# Patient Record
Sex: Female | Born: 1950 | Hispanic: Yes | Marital: Single | Smoking: Never smoker
Health system: Southern US, Community
[De-identification: ages and names within clinical notes are randomized; demographics above are authoritative.]

## PROBLEM LIST (undated history)

## (undated) DIAGNOSIS — E119 Type 2 diabetes mellitus without complications: Secondary | ICD-10-CM

## (undated) DIAGNOSIS — E785 Hyperlipidemia, unspecified: Secondary | ICD-10-CM

## (undated) DIAGNOSIS — I10 Essential (primary) hypertension: Secondary | ICD-10-CM

---

## 2017-03-12 DIAGNOSIS — J189 Pneumonia, unspecified organism: Secondary | ICD-10-CM

## 2017-03-12 DIAGNOSIS — A419 Sepsis, unspecified organism: Secondary | ICD-10-CM

## 2017-03-12 DIAGNOSIS — E119 Type 2 diabetes mellitus without complications: Secondary | ICD-10-CM

## 2017-03-12 DIAGNOSIS — N390 Urinary tract infection, site not specified: Secondary | ICD-10-CM

## 2017-03-12 DIAGNOSIS — R0902 Hypoxemia: Secondary | ICD-10-CM

## 2017-03-12 DIAGNOSIS — I10 Essential (primary) hypertension: Secondary | ICD-10-CM

## 2017-09-02 ENCOUNTER — Encounter (HOSPITAL_COMMUNITY): Payer: Self-pay | Admitting: Emergency Medicine

## 2017-09-02 ENCOUNTER — Inpatient Hospital Stay (HOSPITAL_COMMUNITY)
Admission: EM | Admit: 2017-09-02 | Discharge: 2017-09-06 | DRG: 065 | Disposition: A | Discharge status: Rehabilitation Facility | Payer: Medicaid Other | Attending: Neurology | Admitting: Neurology

## 2017-09-02 ENCOUNTER — Emergency Department (HOSPITAL_COMMUNITY): Payer: Medicaid Other

## 2017-09-02 DIAGNOSIS — R4701 Aphasia: Secondary | ICD-10-CM | POA: Diagnosis present

## 2017-09-02 DIAGNOSIS — E669 Obesity, unspecified: Secondary | ICD-10-CM | POA: Diagnosis present

## 2017-09-02 DIAGNOSIS — R471 Dysarthria and anarthria: Secondary | ICD-10-CM | POA: Diagnosis present

## 2017-09-02 DIAGNOSIS — E1165 Type 2 diabetes mellitus with hyperglycemia: Secondary | ICD-10-CM | POA: Diagnosis present

## 2017-09-02 DIAGNOSIS — E1151 Type 2 diabetes mellitus with diabetic peripheral angiopathy without gangrene: Secondary | ICD-10-CM | POA: Diagnosis present

## 2017-09-02 DIAGNOSIS — I69391 Dysphagia following cerebral infarction: Secondary | ICD-10-CM

## 2017-09-02 DIAGNOSIS — I639 Cerebral infarction, unspecified: Secondary | ICD-10-CM | POA: Diagnosis present

## 2017-09-02 DIAGNOSIS — R1310 Dysphagia, unspecified: Secondary | ICD-10-CM | POA: Diagnosis present

## 2017-09-02 DIAGNOSIS — I610 Nontraumatic intracerebral hemorrhage in hemisphere, subcortical: Principal | ICD-10-CM | POA: Diagnosis present

## 2017-09-02 DIAGNOSIS — R29715 NIHSS score 15: Secondary | ICD-10-CM | POA: Diagnosis present

## 2017-09-02 DIAGNOSIS — R402244 Coma scale, best verbal response, confused conversation, 24 hours or more after hospital admission: Secondary | ICD-10-CM | POA: Diagnosis present

## 2017-09-02 DIAGNOSIS — R402144 Coma scale, eyes open, spontaneous, 24 hours or more after hospital admission: Secondary | ICD-10-CM | POA: Diagnosis present

## 2017-09-02 DIAGNOSIS — E785 Hyperlipidemia, unspecified: Secondary | ICD-10-CM | POA: Diagnosis present

## 2017-09-02 DIAGNOSIS — R29810 Facial weakness: Secondary | ICD-10-CM | POA: Diagnosis present

## 2017-09-02 DIAGNOSIS — Z6826 Body mass index (BMI) 26.0-26.9, adult: Secondary | ICD-10-CM | POA: Diagnosis not present

## 2017-09-02 DIAGNOSIS — Z7984 Long term (current) use of oral hypoglycemic drugs: Secondary | ICD-10-CM | POA: Diagnosis not present

## 2017-09-02 DIAGNOSIS — G8101 Flaccid hemiplegia affecting right dominant side: Secondary | ICD-10-CM | POA: Diagnosis present

## 2017-09-02 DIAGNOSIS — I10 Essential (primary) hypertension: Secondary | ICD-10-CM | POA: Diagnosis present

## 2017-09-02 DIAGNOSIS — R402364 Coma scale, best motor response, obeys commands, 24 hours or more after hospital admission: Secondary | ICD-10-CM | POA: Diagnosis present

## 2017-09-02 DIAGNOSIS — I6789 Other cerebrovascular disease: Secondary | ICD-10-CM | POA: Diagnosis present

## 2017-09-02 DIAGNOSIS — R531 Weakness: Secondary | ICD-10-CM | POA: Diagnosis not present

## 2017-09-02 DIAGNOSIS — D72829 Elevated white blood cell count, unspecified: Secondary | ICD-10-CM

## 2017-09-02 DIAGNOSIS — E1169 Type 2 diabetes mellitus with other specified complication: Secondary | ICD-10-CM

## 2017-09-02 HISTORY — DX: Essential (primary) hypertension: I10

## 2017-09-02 HISTORY — DX: Hyperlipidemia, unspecified: E78.5

## 2017-09-02 HISTORY — DX: Type 2 diabetes mellitus without complications: E11.9

## 2017-09-02 LAB — I-STAT CHEM 8, ED
BUN: 20 mg/dL (ref 6–20)
Calcium, Ion: 1.12 mmol/L — ABNORMAL LOW (ref 1.15–1.40)
Chloride: 104 mmol/L (ref 101–111)
Creatinine, Ser: 0.8 mg/dL (ref 0.44–1.00)
Glucose, Bld: 212 mg/dL — ABNORMAL HIGH (ref 65–99)
HCT: 39 % (ref 36.0–46.0)
Hemoglobin: 13.3 g/dL (ref 12.0–15.0)
Potassium: 4 mmol/L (ref 3.5–5.1)
Sodium: 140 mmol/L (ref 135–145)
TCO2: 23 mmol/L (ref 22–32)

## 2017-09-02 LAB — MRSA PCR SCREENING: MRSA by PCR: NEGATIVE

## 2017-09-02 LAB — COMPREHENSIVE METABOLIC PANEL
ALT: 20 U/L (ref 14–54)
AST: 25 U/L (ref 15–41)
Albumin: 3.4 g/dL — ABNORMAL LOW (ref 3.5–5.0)
Alkaline Phosphatase: 78 U/L (ref 38–126)
Anion gap: 11 (ref 5–15)
BUN: 19 mg/dL (ref 6–20)
CO2: 23 mmol/L (ref 22–32)
Calcium: 9 mg/dL (ref 8.9–10.3)
Chloride: 104 mmol/L (ref 101–111)
Creatinine, Ser: 0.92 mg/dL (ref 0.44–1.00)
GFR calc Af Amer: >60 mL/min (ref >60)
GFR calc non Af Amer: >60 mL/min (ref >60)
Glucose, Bld: 203 mg/dL — ABNORMAL HIGH (ref 65–99)
Potassium: 4 mmol/L (ref 3.5–5.1)
Sodium: 138 mmol/L (ref 135–145)
Total Bilirubin: <0.1 mg/dL — ABNORMAL LOW (ref 0.3–1.2)
Total Protein: 7 g/dL (ref 6.5–8.1)

## 2017-09-02 LAB — CBC
HCT: 38.4 % (ref 36.0–46.0)
Hemoglobin: 12.7 g/dL (ref 12.0–15.0)
MCH: 28.7 pg (ref 26.0–34.0)
MCHC: 33.1 g/dL (ref 30.0–36.0)
MCV: 86.9 fL (ref 78.0–100.0)
Platelets: 180 10*3/uL (ref 150–400)
RBC: 4.42 MIL/uL (ref 3.87–5.11)
RDW: 13.1 % (ref 11.5–15.5)
WBC: 14.2 10*3/uL — ABNORMAL HIGH (ref 4.0–10.5)

## 2017-09-02 LAB — DIFFERENTIAL
Basophils Absolute: 0.1 10*3/uL (ref 0.0–0.1)
Basophils Relative: 0 %
Eosinophils Absolute: 0.3 10*3/uL (ref 0.0–0.7)
Eosinophils Relative: 2 %
Lymphocytes Relative: 22 %
Lymphs Abs: 3.1 10*3/uL (ref 0.7–4.0)
Monocytes Absolute: 0.8 10*3/uL (ref 0.1–1.0)
Monocytes Relative: 5 %
Neutro Abs: 10.1 10*3/uL — ABNORMAL HIGH (ref 1.7–7.7)
Neutrophils Relative %: 71 %

## 2017-09-02 LAB — I-STAT TROPONIN, ED: Troponin i, poc: 0 ng/mL (ref 0.00–0.08)

## 2017-09-02 LAB — APTT: aPTT: 24 seconds (ref 24–36)

## 2017-09-02 LAB — CBG MONITORING, ED: Glucose-Capillary: 191 mg/dL — ABNORMAL HIGH (ref 65–99)

## 2017-09-02 LAB — ETHANOL: Alcohol, Ethyl (B): <10 mg/dL (ref <10)

## 2017-09-02 LAB — PROTIME-INR
INR: 0.93
Prothrombin Time: 12.3 seconds (ref 11.4–15.2)

## 2017-09-02 LAB — GLUCOSE, CAPILLARY: Glucose-Capillary: 124 mg/dL — ABNORMAL HIGH (ref 65–99)

## 2017-09-02 MED ORDER — INSULIN ASPART 100 UNIT/ML ~~LOC~~ SOLN
2.0000 [IU] | SUBCUTANEOUS | Status: DC
  Administered 2017-09-03 (×2): 2 [IU] via SUBCUTANEOUS
  Administered 2017-09-03 – 2017-09-04 (×2): 4 [IU] via SUBCUTANEOUS
  Administered 2017-09-04: 2 [IU] via SUBCUTANEOUS
  Administered 2017-09-04: 4 [IU] via SUBCUTANEOUS
  Administered 2017-09-05: 6 [IU] via SUBCUTANEOUS
  Administered 2017-09-05 (×2): 4 [IU] via SUBCUTANEOUS
  Administered 2017-09-05 – 2017-09-06 (×2): 2 [IU] via SUBCUTANEOUS

## 2017-09-02 MED ORDER — STROKE: EARLY STAGES OF RECOVERY BOOK
Freq: Once | Status: AC
  Administered 2017-09-02: 22:00:00
  Filled 2017-09-02: qty 1

## 2017-09-02 MED ORDER — PANTOPRAZOLE SODIUM 40 MG IV SOLR
40.0000 mg | Freq: Every day | INTRAVENOUS | Status: DC
  Administered 2017-09-02 – 2017-09-03 (×2): 40 mg via INTRAVENOUS
  Filled 2017-09-02 (×2): qty 40

## 2017-09-02 MED ORDER — ACETAMINOPHEN 325 MG PO TABS
650.0000 mg | ORAL_TABLET | ORAL | Status: DC | PRN
Start: 2017-09-02 — End: 2017-09-06

## 2017-09-02 MED ORDER — CLEVIDIPINE BUTYRATE 0.5 MG/ML IV EMUL
0.0000 mg/h | INTRAVENOUS | Status: DC
  Administered 2017-09-02: 1 mg/h via INTRAVENOUS
  Administered 2017-09-02: 16 mg/h via INTRAVENOUS
  Administered 2017-09-03: 6 mg/h via INTRAVENOUS
  Administered 2017-09-03: 5 mg/h via INTRAVENOUS
  Administered 2017-09-03 – 2017-09-04 (×2): 6 mg/h via INTRAVENOUS
  Filled 2017-09-02 (×2): qty 50
  Filled 2017-09-02: qty 100
  Filled 2017-09-02 (×3): qty 50

## 2017-09-02 MED ORDER — ACETAMINOPHEN 160 MG/5ML PO SOLN
650.0000 mg | ORAL | Status: DC | PRN
Start: 2017-09-02 — End: 2017-09-06

## 2017-09-02 MED ORDER — ACETAMINOPHEN 650 MG RE SUPP
650.0000 mg | RECTAL | Status: DC | PRN
Start: 2017-09-02 — End: 2017-09-06

## 2017-09-02 MED ORDER — SENNOSIDES-DOCUSATE SODIUM 8.6-50 MG PO TABS
1.0000 | ORAL_TABLET | Freq: Two times a day (BID) | ORAL | Status: DC
  Administered 2017-09-03 – 2017-09-06 (×6): 1 via ORAL
  Filled 2017-09-02 (×7): qty 1

## 2017-09-02 NOTE — ED Notes (Signed)
CBG was 191

## 2017-09-02 NOTE — ED Notes (Signed)
Pt's family now at bedside speaking with Neurology

## 2017-09-02 NOTE — ED Triage Notes (Signed)
Pt LSN 1530. Family found pt unconscious on floor at 1530. Pt had been with family prior to this and was WNL. Pt nonverbal for family, right arm/leg weakness, right sided facial droop. CBG 241, BP 160/80, HR 100 SR. Pt spanish speaking.

## 2017-09-02 NOTE — H&P (Signed)
Neurology H&P  CC: Right-sided weakness  History is obtained from: Family, EMS  HPI: Cindy Thornton is a 66 y.o. female with a history of hypertension who presents with right-sided weakness that started earlier today.  She was last known well around 1 PM.  Her niece left to go to an event and then around 3:30 PM, she talked to her son on the phone, but he could tell something was not right.  He then drove and found her on the floor, not responding very much.  EMS was therefore called and transported her as a code stroke.  LKW: Noon tpa given?: no, ICH ICH Score: 0  ROS:  Unable to obtain due to altered mental status.   Past Medical History:  Diagnosis Date  . Diabetes mellitus without complication (HCC)   . Hyperlipidemia   . Hypertension      Family history: Unable to obtain due to altered mental status   Social History:  reports that  has never smoked. she has never used smokeless tobacco. She reports that she does not drink alcohol or use drugs.   Exam: Current vital signs: BP (!) 152/67   Pulse (!) 103   Temp 98.5 F (36.9 C) (Tympanic)   Resp 18   Wt 71.9 kg (158 lb 8.2 oz)   SpO2 98%  Vital signs in last 24 hours: Temp:  [98.5 F (36.9 C)] 98.5 F (36.9 C) (12/11 1825) Pulse Rate:  [94-103] 103 (12/11 1840) Resp:  [14-21] 18 (12/11 1840) BP: (145-183)/(55-140) 152/67 (12/11 1840) SpO2:  [96 %-98 %] 98 % (12/11 1840) Weight:  [71.9 kg (158 lb 8.2 oz)] 71.9 kg (158 lb 8.2 oz) (12/11 1824)  Physical Exam  Constitutional: Appears well-developed and well-nourished.  Psych: Affect appears flattened Eyes: No scleral injection HENT: No OP obstrucion Head: Normocephalic.  Cardiovascular: Normal rate and regular rhythm.  Respiratory: Effort normal and breath sounds normal to anterior ascultation GI: Soft.  No distension. There is no tenderness.  Skin: WDI  Neuro: Mental Status: Patient is awake, alert, she is able to tell me her name, but does not answer the  month or her age She is severely dysarthric. Cranial Nerves: II: Visual Fields are full. Pupils are equal, round, and reactive to light.   III,IV, VI: She crosses midline readily, but does appear to have a slight left gaze preference. 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  Due to the location, she is not a surgical candidate at this time.  She is being admitted to the intensive care unit for aggressive blood pressure management and close observation.  Recommendations: 1) Admit to ICU 2) no antiplatelets or anticoagulants 3) blood pressure control with goal systolic 120 - 140 4) Frequent neuro checks 5) If symptoms worsen or there is decreased mental status, repeat stat head CT 6) PT,OT,ST   This patient is critically ill and at significant risk of neurological worsening, death and care requires constant  monitoring of vital signs, hemodynamics,respiratory and cardiac monitoring, neurological assessment, discussion with family, other specialists and medical decision making of high complexity. I spent 50 minutes of neurocritical care time  in the care of  this patient.  Ritta SlotMcNeill Banner Huckaba, MD [facility] [phone]  If 7pm- 7am, please page neurology on call as listed in AMION. 09/02/2017  6:48 PM

## 2017-09-02 NOTE — ED Notes (Addendum)
Assumed care on pt. , Cleviprex IV drip infusing at 16 mg/hr , BP = 135/50 , denies pain /respirations unlabored , IV site intact , family at bedside . Right side arm/leg weakness continues , mild dysarthria and right facial droop continues .

## 2017-09-03 ENCOUNTER — Other Ambulatory Visit: Payer: Self-pay

## 2017-09-03 ENCOUNTER — Inpatient Hospital Stay (HOSPITAL_COMMUNITY): Payer: Medicaid Other

## 2017-09-03 ENCOUNTER — Encounter (HOSPITAL_COMMUNITY): Payer: Self-pay | Admitting: Physical Medicine and Rehabilitation

## 2017-09-03 DIAGNOSIS — I10 Essential (primary) hypertension: Secondary | ICD-10-CM

## 2017-09-03 DIAGNOSIS — D72829 Elevated white blood cell count, unspecified: Secondary | ICD-10-CM

## 2017-09-03 DIAGNOSIS — I69391 Dysphagia following cerebral infarction: Secondary | ICD-10-CM

## 2017-09-03 DIAGNOSIS — E1169 Type 2 diabetes mellitus with other specified complication: Secondary | ICD-10-CM

## 2017-09-03 DIAGNOSIS — I361 Nonrheumatic tricuspid (valve) insufficiency: Secondary | ICD-10-CM

## 2017-09-03 DIAGNOSIS — I6300 Cerebral infarction due to thrombosis of unspecified precerebral artery: Secondary | ICD-10-CM

## 2017-09-03 DIAGNOSIS — E669 Obesity, unspecified: Secondary | ICD-10-CM

## 2017-09-03 LAB — RAPID URINE DRUG SCREEN, HOSP PERFORMED
Amphetamines: NOT DETECTED
Barbiturates: NOT DETECTED
Benzodiazepines: NOT DETECTED
Cocaine: NOT DETECTED
Opiates: NOT DETECTED
Tetrahydrocannabinol: NOT DETECTED

## 2017-09-03 LAB — URINALYSIS, ROUTINE W REFLEX MICROSCOPIC
Bilirubin Urine: NEGATIVE
Glucose, UA: NEGATIVE mg/dL
Ketones, ur: NEGATIVE mg/dL
Leukocytes, UA: NEGATIVE
Nitrite: NEGATIVE
Protein, ur: 30 mg/dL — AB
Specific Gravity, Urine: 1.012 (ref 1.005–1.030)
pH: 5 (ref 5.0–8.0)

## 2017-09-03 LAB — GLUCOSE, CAPILLARY
GLUCOSE-CAPILLARY: 172 mg/dL — AB (ref 65–99)
Glucose-Capillary: 112 mg/dL — ABNORMAL HIGH (ref 65–99)
Glucose-Capillary: 114 mg/dL — ABNORMAL HIGH (ref 65–99)
Glucose-Capillary: 125 mg/dL — ABNORMAL HIGH (ref 65–99)
Glucose-Capillary: 148 mg/dL — ABNORMAL HIGH (ref 65–99)
Glucose-Capillary: 73 mg/dL (ref 65–99)

## 2017-09-03 LAB — ECHOCARDIOGRAM COMPLETE
HEIGHTINCHES: 65 in
Weight: 2497.37 oz

## 2017-09-03 LAB — LIPID PANEL
CHOLESTEROL: 114 mg/dL (ref 0–200)
HDL: 44 mg/dL (ref >40)
LDL Cholesterol: 47 mg/dL (ref 0–99)
TRIGLYCERIDES: 113 mg/dL (ref <150)
Total CHOL/HDL Ratio: 2.6 RATIO
VLDL: 23 mg/dL (ref 0–40)

## 2017-09-03 LAB — HEMOGLOBIN A1C
Hgb A1c MFr Bld: 7.1 % — ABNORMAL HIGH (ref 4.8–5.6)
Mean Plasma Glucose: 157.07 mg/dL

## 2017-09-03 MED ORDER — RESOURCE THICKENUP CLEAR PO POWD
ORAL | Status: DC | PRN
  Filled 2017-09-03: qty 125

## 2017-09-03 MED ORDER — IOPAMIDOL (ISOVUE-370) INJECTION 76%
INTRAVENOUS | Status: AC
  Filled 2017-09-03: qty 50

## 2017-09-03 MED ORDER — ATORVASTATIN CALCIUM 10 MG PO TABS
10.0000 mg | ORAL_TABLET | Freq: Every day | ORAL | Status: DC
  Administered 2017-09-03 – 2017-09-05 (×3): 10 mg via ORAL
  Filled 2017-09-03 (×3): qty 1

## 2017-09-03 MED ORDER — ORAL CARE MOUTH RINSE
15.0000 mL | Freq: Two times a day (BID) | OROMUCOSAL | Status: DC
  Administered 2017-09-03 – 2017-09-06 (×6): 15 mL via OROMUCOSAL

## 2017-09-03 NOTE — Progress Notes (Signed)
STROKE TEAM PROGRESS NOTE  Admission history; Cindy Thornton is a 66 y.o. female with a history of hypertension who presents with right-sided weakness that started earlier today.  She was last known well around 1 PM.  Her niece left to go to an event and then around 3:30 PM, she talked to her son on the phone, but he could tell something was not right.  He then drove and found her on the floor, not responding very much.  EMS was therefore called and transported her as a code stroke.  LKW: Noon tpa given?: no, ICH ICH Score: 0  SUBJECTIVE (INTERVAL HISTORY) Son is at the bedside. Patient is found laying in bed in NAD  Overall he feels her condition is stable.  He voices no new concerns. No new events reported overnight. Her blood pressure has been adequately controlled. Neurological exam has been stable  OBJECTIVE Lab Results: CBC:  Recent Labs  Lab 09/02/17 1811 09/02/17 1819  WBC 14.2*  --   HGB 12.7 13.3  HCT 38.4 39.0  MCV 86.9  --   PLT 180  --    BMP: Recent Labs  Lab 09/02/17 1811 09/02/17 1819  NA 138 140  K 4.0 4.0  CL 104 104  CO2 23  --   GLUCOSE 203* 212*  BUN 19 20  CREATININE 0.92 0.80  CALCIUM 9.0  --    Liver Function Tests:  Recent Labs  Lab 09/02/17 1811  AST 25  ALT 20  ALKPHOS 78  BILITOT <0.1*  PROT 7.0  ALBUMIN 3.4*   Coagulation Studies:  Recent Labs    09/02/17 1811  APTT 24  INR 0.93   Urinalysis:  Recent Labs  Lab 09/03/17 0808  COLORURINE YELLOW  APPEARANCEUR CLEAR  LABSPEC 1.012  PHURINE 5.0  GLUCOSEU NEGATIVE  HGBUR SMALL*  BILIRUBINUR NEGATIVE  KETONESUR NEGATIVE  PROTEINUR 30*  NITRITE NEGATIVE  LEUKOCYTESUR NEGATIVE   Urine Drug Screen:     Component Value Date/Time   LABOPIA NONE DETECTED 09/03/2017 0808   COCAINSCRNUR NONE DETECTED 09/03/2017 0808   LABBENZ NONE DETECTED 09/03/2017 0808   AMPHETMU NONE DETECTED 09/03/2017 0808   THCU NONE DETECTED 09/03/2017 0808   LABBARB NONE DETECTED 09/03/2017  0808    Alcohol Level:  Recent Labs  Lab 09/02/17 1811  ETH <10    PHYSICAL EXAM Temp:  [98.4 F (36.9 C)-98.9 F (37.2 C)] 98.6 F (37 C) (12/12 0800) Pulse Rate:  [75-110] 104 (12/12 1200) Resp:  [11-25] 16 (12/12 1200) BP: (92-183)/(37-140) 148/53 (12/12 1200) SpO2:  [95 %-99 %] 97 % (12/12 1200) Weight:  [70.8 kg (156 lb 1.4 oz)-71.9 kg (158 lb 8.2 oz)] 70.8 kg (156 lb 1.4 oz) (12/11 2030) General - Well nourished, well developed, in no apparent distress Respiratory - Lungs clear bilaterally. No wheezing. Cardiovascular - Regular rate and rhythm  Neuro: Mental Status: Patient is awake, alert, she is able to tell me her name, but does not answer the month or her age, She is severely dysarthric. Cranial Nerves: II: Visual Fields are full. Pupils are equal, round, and reactive to light.   III,IV, VI: She crosses midline readily, but does appear to have a slight left gaze preference. V: Facial sensation is diminished on the right VII: Facial movement is notable for right facial droop VIII: hearing is intact to voice XII: tongue deviates to the right Motor: She has a severe right flaccid hemiparesis, 1/5 in the arm and leg. Purposeful antigravity movements on  the left. Sensory: Sensation is severely reduced on the right Cerebellar: No clear ataxia when moving her left side  IMAGING: I have personally reviewed the radiological images below and agree with the radiology interpretations.  Ct Head Code Stroke Wo Contrast Addendum Date: 09/02/2017   IMPRESSION: 1. Hyperdense 2.7 cm area of hemorrhage in the posterior left lentiform nuclei with surrounding edema. Mild regional mass effect with no midline shift. No intraventricular or extra-axial extension of hemorrhage. 2. Advanced bilateral cerebral white matter disease and small area of chronic cortical encephalomalacia in the posterior left MCA territory. 3. ASPECTS is not applicable, acute hemorrhage.  Echocardiogram:   Study Conclusions - Left ventricle: The cavity size was normal. Wall thickness was   increased in a pattern of moderate LVH. Systolic function was   normal. The estimated ejection fraction was in the range of 60%   to 65%. Wall motion was normal; there were no regional wall   motion abnormalities. Doppler parameters are consistent with   abnormal left ventricular relaxation (grade 1 diastolic   dysfunction). The E/e&' ratio is between 8-15, suggesting   indeterminate LV filling pressure. - Aortic valve: Mildly calcified leaflets. Mild stenosis. There was   no regurgitation. Mean gradient (S): 12 mm Hg. Peak gradient (S):   22 mm Hg. Valve area (VTI): 1.64 cm^2. Valve area (Vmax): 1.63   cm^2. Valve area (Vmean): 1.74 cm^2. - Mitral valve: Mildly thickened leaflets . There was trivial   regurgitation. - Left atrium: The atrium was normal in size. - Tricuspid valve: There was mild regurgitation. - Pulmonary arteries: PA peak pressure: 49 mm Hg (S). - Inferior vena cava: The vessel was normal in size. The   respirophasic diameter changes were in the normal range (>= 50%),   consistent with normal central venous pressure. Impressions: - LVEF 60-65%, moderate LVH, normal wall motion, grade 1 DD,   indeterminate LV filling pressure, mild aortic stenosis - mean   gradient 12 mmHg, AVA of 1.7 cm2, normal biatrial size, mild TR,   RVSP 49 mmHg, normal IVC.  CTA Head/Neck:                                                PENDING MRI Head:                                                          PENDING MBS:                                                                   PENDING _____________________________________________________________________ ASSESSMENT: Ms. Cindy Thornton is a 66 y.o. female with PMH of HTN, HLD, DM admitted with complaints of right-sided weakness.  CT reveals basal ganglia hemorrhage possibly hypertensive in etiology. Due to the location, she is not a surgical  candidate at this time.  She is being admitted to the intensive care unit for aggressive blood pressure management and close observation.  Left  posterior lentiform nuclei -2.7 cm area of hemorrhage w/surrounding edema. Mild regional mass effect, no midline shift.   Suspected Etiology: unknown. Possibly hypertensive in etiology.   Resultant Symptoms: Right-sided weakness Stroke Risk Factors: diabetes mellitus, hyperlipidemia and hypertension Other Stroke Risk Factors: Advanced age,  Outstanding Stroke Work-up Studies: CTA Head/Neck:                                                PENDING MRI Head:                                                          PENDING MBS:                                                                   PENDING  09/03/17: Neuro exam remained stable.  Patient continues to have right-sided weakness.  Modified barium swallow in progress.  Case management consulted for insurance needs. Long discussion with son at bedside regarding plan of care and long-term prognosis  PLAN  09/03/2017: HOLD ASA for now Continue Atorvastatin Consult case management for insurance and placement needs Ongoing aggressive stroke risk factor management Patient counseled to be compliant with her antithrombotic medications Follow up with [facility] in 6 weeks  DYSPHAGIA: NPO until passes SLP swallow evaluation Modified barium swallow in progress  Leukocytosis Likely reactive, remains afebrile at 99.0 UA negative Will obtain portable chest x-ray Repeat labs in a.m.  HYPERTENSION: Stable,some elevated B/P's on admission but trending down now SBP goal less than 140/90  Nicardipine drip, Labetolol PRN Long term BP goal normotensive. May slowly restart home B/P medications  Home Meds: Metoprolol, lisinopril-HCTZ  HYPERLIPIDEMIA:    Component Value Date/Time   CHOL 114 09/03/2017 0804   TRIG 113 09/03/2017 0804   HDL 44 09/03/2017 0804   CHOLHDL 2.6 09/03/2017  0804   VLDL 23 09/03/2017 0804   LDLCALC 47 09/03/2017 0804  Home Meds: Lipitor 20 LDL  goal < 70 Continued on Lipitor to 10 mg daily Continue statin at discharge  DIABETES: Lab Results  Component Value Date   HGBA1C 7.1 (H) 09/03/2017  HgbA1c goal < 7.0 Currently on: NovoLog Continue CBG monitoring and SSI DM education   Other Active Problems: Active Problems:   Stroke (cerebrum) [facility])  Hospital day # 1  VTE prophylaxis: SCD's  Diet : Diet NPO time specified   Prior Home Stroke Medications: No antithrombotic  Discharge Stroke Meds: Please discharge patient on TBD   Disposition: Final discharge disposition not confirmed Therapy Recs:   Likely CIR Follow Recs:  Follow-up Information    Micki Riley, MD. Schedule an appointment as soon as possible for a visit in 6 week(s).   Specialties:  Neurology, Radiology Contact information: [address] [place] [address] [phone]          No primary care provider on file.-Case management aware.  FAMILY UPDATES: Son at bedside  TEAM UPDATES: Micki RileySethi, Avriel Kandel S, MD  Brita RompMary A Costello, ANP-C Stroke Neurology Team 09/03/2017 1:00 PM I have personally examined this patient, reviewed notes, independently viewed imaging studies, participated in medical decision making and plan of care.ROS completed by me personally and pertinent positives fully documented  I have made any additions or clarifications directly to the above note. Agree with note above. She has presented with right hemiplegia due to left subcortical hemorrhage etiology likely hypertensive though her blood pressure has not been significantly elevated. She remains at risk for neurological worsening and hematoma expansion. Recommend close neurological monitoring and tight blood pressure control. Check CT angiogram of the brain. Long discussion with the bedside with the patient's son and answered questions.  This patient is critically ill and at  significant risk of neurological worsening, death and care requires constant monitoring of vital signs, hemodynamics,respiratory and cardiac monitoring, extensive review of multiple databases, frequent neurological assessment, discussion with family, other specialists and medical decision making of high complexity.I have made any additions or clarifications directly to the above note.This critical care time does not reflect procedure time, or teaching time or supervisory time of PA/NP/Med Resident etc but could involve care discussion time.  I spent 30 minutes of neurocritical care time  in the care of  this patient.    Delia HeadyPramod Samoria Fedorko, MD Medical Director [facility] Pager: [phone] 09/03/2017 1:43 PM  To contact Stroke Continuity provider, please refer to [email or web address]. After hours, contact General Neurology

## 2017-09-03 NOTE — Progress Notes (Signed)
Modified Barium Swallow Progress Note  Patient Details  Name: Cindy Thornton MRN: [id number] Date of Birth: December 07, 1950  Today's Date: 09/03/2017  Modified Barium Swallow completed.  Full report located under Chart Review in the Imaging Section.  Brief recommendations include the following:  Clinical Impression  Pt demonstrates a moderate oral dysphagia due to impaired lingual coordination for bolus propulsion. Preparation of solids is laborius and results in significant premature spillage of bolus mixing with saliva prior to swallow initaition, posing significant aspiration risk. Oropharyngeal function is strong, but swallow initiation is delayed. Depending on method of intake (cup vs straw), rate and bolus size pt has instances of sensed aspiration before the swallow. Respiratory drive is impaired for volitional and reflexive coughing and aspirate is not fully expectorated. Pt tolerates nectar straw well and can consume thin liquids with a straw and a chin tuck. Will initiate a conservative diet of nectar thick liquids and puree and advance at bedside as pt exhibits consistency with a chin tuck. Pt would also benefit from EMST to improve expiratory drive.    Swallow Evaluation Recommendations       SLP Diet Recommendations: Dysphagia 1 (Puree) solids;Nectar thick liquid   Liquid Administration via: Straw   Medication Administration: Whole meds with puree   Supervision: Staff to assist with self feeding   Compensations: Slow rate;Small sips/bites;Use straw to facilitate chin tuck   Postural Changes: Seated upright at 90 degrees   Oral Care Recommendations: Oral care BID      Harlon DittyBonnie Karishma Unrein, MA CCC-SLP [phone]   Cindy Thornton, Cindy Thornton 09/03/2017,2:49 PM

## 2017-09-03 NOTE — Evaluation (Addendum)
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 CT shows Hyperdense 2.7 cm area of hemorrhage in the posterior left lentiform nuclei with surrounding edema.   Clinical Impression  Cindy Thornton, SLP acted as Nurse, learning disabilitytranslator for co-evaluation this date as pt is spanish speaking only. Pt able to follow commands consistently but demo's R sided weakness, impaired coordination in UE/LE, impaired sensation ultimately affection functional abilities and requiring maxA for OOB mobility. Pt motivated. Pt to benefit from CIR upon d/c to maximize functional recovery as pt was indep PTA.    Follow Up Recommendations CIR    Equipment Recommendations  None recommended by PT(TBD)    Recommendations for Other Services Rehab consult     Precautions / Restrictions Precautions Precautions: Fall Precaution Comments: spanish speaking only Restrictions Weight Bearing Restrictions: No      Mobility  Bed Mobility Overal bed mobility: Needs Assistance Bed Mobility: Supine to Sit     Supine to sit: Min assist;Mod assist;+2 for physical assistance     General bed mobility comments: HOB elevated, pt able to bring LEs over to EOB, pt used L UE to pull up, modA to scoot to EOB  Transfers Overall transfer level: Needs assistance Equipment used: (2 person lift with gait belt and bed pad) Transfers: Sit to/from UGI CorporationStand;Stand Pivot Transfers Sit to Stand: Mod assist;+2 physical assistance Stand pivot transfers: Mod assist;+2 physical assistance       General transfer comment: pt able to power up, R LE with noted instability and knee buckling requiring therapist to block knee  Ambulation/Gait             General Gait Details: unable at this time  Stairs            Wheelchair Mobility    Modified Rankin  (Stroke Patients Only)       Balance Overall balance assessment: Needs assistance Sitting-balance support: Feet supported;Bilateral upper extremity supported Sitting balance-Leahy Scale: Fair Sitting balance - Comments: pt able to maintain midline x 1 min without physical assist                                     Pertinent Vitals/Pain Pain Assessment: No/denies pain Faces Pain Scale: Hurts a little bit Pain Intervention(s): Monitored during session    Home Living Family/patient expects to be discharged to:: Inpatient rehab Living Arrangements: (granddaughter) Available Help at Discharge: Family;Available PRN/intermittently             Additional Comments: pt lives with granddaughter and is indep with mobiltiy and ADLs, pt watches children in the home    Prior Function Level of Independence: Independent         Comments: ADLs. Does not read. Takes care of a few children in her home. Granddaughter does IADLs. Fatigues quickly with mobility     Hand Dominance   Dominant Hand: Right    Extremity/Trunk Assessment   Upper Extremity Assessment Upper Extremity Assessment: Defer to OT evaluation(noted R sided weakness) RUE Deficits / Details: No AROM of RUE. 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 RUE Sensation: decreased light touch RUE Coordination: decreased fine motor;decreased gross motor    Lower Extremity Assessment Lower Extremity Assessment: RLE deficits/detail RLE Deficits / Details: pt with initation of LAQ in sitting  but no hip or ankle flexion    Cervical / Trunk Assessment Cervical / Trunk Assessment: Other exceptions Cervical / Trunk Exceptions: R lateral lean  Communication   Communication: Expressive difficulties  Cognition Arousal/Alertness: Awake/alert Behavior During Therapy: WFL for tasks assessed/performed;Flat affect Overall Cognitive Status: Impaired/Different from baseline Area of Impairment:  Following commands;Safety/judgement;Awareness;Problem solving;Attention                   Current Attention Level: Sustained   Following Commands: Follows one step commands with increased time;Follows one step commands inconsistently Safety/Judgement: Decreased awareness of safety;Decreased awareness of deficits Awareness: Intellectual Problem Solving: Slow processing;Requires verbal cues;Requires tactile cues        General Comments      Exercises     Assessment/Plan    PT Assessment Patient needs continued PT services  PT Problem List Decreased strength;Decreased range of motion;Decreased activity tolerance;Decreased balance;Decreased mobility;Decreased coordination;Decreased cognition;Decreased knowledge of use of DME;Decreased safety awareness;Decreased knowledge of precautions       PT Treatment Interventions DME instruction;Gait training;Stair training;Functional mobility training;Therapeutic activities;Therapeutic exercise;Balance training;Neuromuscular re-education;Cognitive remediation;Patient/family education    PT Goals (Current goals can be found in the Care Plan section)  Acute Rehab PT Goals Patient Stated Goal: didn't state PT Goal Formulation: With patient/family Time For Goal Achievement: 09/10/17 Potential to Achieve Goals: Good    Frequency Min 4X/week   Barriers to discharge        Co-evaluation PT/OT/SLP Co-Evaluation/Treatment: Yes Reason for Co-Treatment: For patient/therapist safety;Complexity of the patient's impairments (multi-system involvement) PT goals addressed during session: Mobility/safety with mobility         AM-PAC PT "6 Clicks" Daily Activity  Outcome Measure Difficulty turning over in bed (including adjusting bedclothes, sheets and blankets)?: Unable Difficulty moving from lying on back to sitting on the side of the bed? : Unable Difficulty sitting down on and standing up from a chair with arms (e.g., wheelchair, bedside  commode, etc,.)?: Unable Help needed moving to and from a bed to chair (including a wheelchair)?: Total Help needed walking in hospital room?: Total Help needed climbing 3-5 steps with a railing? : Total 6 Click Score: 6    End of Session Equipment Utilized During Treatment: Gait belt Activity Tolerance: Patient tolerated treatment well Patient left: in chair;with bed alarm set;with nursing/sitter in room Nurse Communication: Mobility status PT Visit Diagnosis: Unsteadiness on feet (R26.81);Hemiplegia and hemiparesis Hemiplegia - Right/Left: Right Hemiplegia - dominant/non-dominant: Dominant Hemiplegia - caused by: Cerebral infarction    Time: [id number] PT Time Calculation (min) (ACUTE ONLY): 25 min   Charges:   PT Evaluation $PT Eval Moderate Complexity: 1 Mod     PT G Codes:        Lewis ShockAshly Treana Lacour, PT, DPT Pager #: [phone] Office #: [phone]   Christabel Camire M Kerman Pfost 09/03/2017, 2:28 PM

## 2017-09-03 NOTE — Evaluation (Signed)
Clinical/Bedside Swallow Evaluation Patient Details  Name: Cindy Thornton MRN: [id number] Date of Birth: September 01, 1951  Today's Date: 09/03/2017 Time: SLP Start Time (ACUTE ONLY): 1050 SLP Stop Time (ACUTE ONLY): 1120 SLP Time Calculation (min) (ACUTE ONLY): 30 min  Past Medical History:  Past Medical History:  Diagnosis Date  . Diabetes mellitus without complication (HCC)   . Hyperlipidemia   . Hypertension    Past Surgical History: History reviewed. No pertinent surgical history. 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 CT shows Hyperdense 2.7 cm area of hemorrhage in the posterior left lentiform nuclei with surrounding edema.   Assessment / Plan / Recommendation Clinical Impression  Pt demonstrates inconsistent coughing following sips of thin liquids. Given overt oral dysphagia with right labial and lingual weakness and concern for decreased airway proteciton will proceed with MBS for objective assessment of swallowing. Pt and family in agreement.  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 CT shows Hyperdense 2.7 cm area of hemorrhage in the posterior left lentiform nuclei with surrounding edema. Type of Study: Bedside Swallow Evaluation Previous Swallow Assessment: none Diet Prior to this Study: NPO Temperature Spikes Noted: No Respiratory Status: Room air History of Recent Intubation: No Behavior/Cognition: Alert;Cooperative;Pleasant mood Oral Cavity Assessment: Within Functional Limits Oral Care Completed by SLP: Yes Oral Cavity - Dentition:  Adequate natural dentition Vision: Functional for self-feeding Self-Feeding Abilities: Needs assist Patient Positioning: Upright in bed Baseline Vocal Quality: Low vocal intensity Volitional Cough: Weak    Oral/Motor/Sensory Function Overall Oral Motor/Sensory Function: Within functional limits   Ice Chips Ice chips: Within functional limits   Thin Liquid Thin Liquid: Impaired Presentation: Straw Pharyngeal  Phase Impairments: Cough - Delayed    Nectar Thick Nectar Thick Liquid: Not tested   Honey Thick Honey Thick Liquid: Not tested   Puree Puree: Not tested   Solid   GO   Solid: Not tested       Harlon DittyBonnie Naydelin Ziegler, MA CCC-SLP [phone]  Kentaro Alewine, Riley NearingBonnie [name] 09/03/2017,2:12 PM

## 2017-09-03 NOTE — Evaluation (Signed)
Speech Language Pathology Evaluation Patient Details Name: Cindy Thornton MRN: [id number] DOB: [birth year] Today's Date: 09/03/2017 Time: [id number] SLP Time Calculation (min) (ACUTE ONLY): 35 min  Problem List:  Patient Active Problem List   Diagnosis Date Noted  . 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 Diabetes mellitus without complication (HCC)   . Hyperlipidemia   . Hypertension    Past Surgical History: History reviewed. No pertinent surgical history. 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 CT shows Hyperdense 2.7 cm area of hemorrhage in the posterior left lentiform nuclei with surrounding edema.   Assessment / Plan / Recommendation Clinical Impression  Primary impairments impacting functional communication are poor respiratory drive resulting in minimal breath support for speech and severe dysarthria due to right labial and lingual weakness. Pts auditory comprehension with single commands and basic conversation appears in tact. Verbal expression is difficult to accurately assess due to poor intelligiblity. In most cases pts articulatory placements appear different from target, but improved with a model. Aphasia cannot be excluded. Therapeutic interventions included verbal cues for use of head nod/shake to reinforce y/n response, improved breath support for increased volume and total communication strategies with family. Recommend CIR at d/c. Will continue efforts.     SLP Assessment  SLP Recommendation/Assessment: Patient needs continued Speech Lanaguage Pathology Services SLP Visit Diagnosis: Dysarthria and anarthria (R47.1)    Follow Up Recommendations  Inpatient Rehab    Frequency and Duration min 2x/week  2 weeks      SLP Evaluation Cognition  Overall Cognitive Status: Impaired/Different from baseline Arousal/Alertness: Awake/alert Orientation Level:  Oriented to person;Oriented to place;Disoriented to time Attention: Sustained;Selective Sustained Attention: Appears intact Selective Attention: Appears intact Memory: Appears intact Problem Solving: Appears intact Safety/Judgment: Appears intact       Comprehension  Auditory Comprehension Overall Auditory Comprehension: Appears within functional limits for tasks assessed    Expression Verbal Expression Overall Verbal Expression: Impaired Initiation: Impaired Automatic Speech: Name;Counting;Social Response Level of Generative/Spontaneous Verbalization: Word Repetition: Impaired Level of Impairment: Word level Naming: Not tested Interfering Components: Speech intelligibility Effective Techniques: Articulatory cues Written Expression Dominant Hand: Right Written Expression: Not tested(pt unable to write)   Oral / Motor  Oral Motor/Sensory Function Overall Oral Motor/Sensory Function: Moderate impairment Facial ROM: Reduced right Facial Symmetry: Abnormal symmetry right;Suspected CN VII (facial) dysfunction Facial Strength: Reduced right;Suspected CN VII (facial) dysfunction Lingual ROM: Reduced right;Suspected CN XII (hypoglossal) dysfunction Lingual Symmetry: Abnormal symmetry right;Suspected CN XII (hypoglossal) dysfunction Lingual Strength: Reduced;Suspected CN XII (hypoglossal) dysfunction Velum: Within Functional Limits Mandible: Within Functional Limits Motor Speech Overall Motor Speech: Impaired Respiration: Impaired Level of Impairment: Word Phonation: Breathy;Low vocal intensity Resonance: Within functional limits Articulation: Impaired Level of Impairment: Word Intelligibility: Intelligibility reduced Word: 25-49% accurate Phrase: 0-24% accurate Motor Planning: Witnin functional limits   GO                   Harlon DittyBonnie Tamilyn Lupien, MA CCC-SLP [phone]  Claudine MoutonDeBlois, Delois Tolbert [name] 09/03/2017, 2:32 PM

## 2017-09-03 NOTE — Consult Note (Signed)
Physical Medicine and Rehabilitation Consult   Reason for Consult: Stroke with aphasia and functional deficits  Referring Physician: Dr. Pearlean Brownie.    HPI: Cindy Thornton is a 66 y.o. female with history of T2DM, HTN who was admitted on 09/02/17 after being found by family with left sided weakness, right facial droop and difficulty speaking. History taken from chart review and son.  CT head reviewed, showing left basal ganglia hemorrhage.  Per report, left basal ganglia hemorrhage with surrounding edema and mild regional mass effect.  Bleed felt to be hypertensive in nature and she was started on nicardipine drip.  2D echo showed EF 60-65% with moderate LVH, mild aortic stenosis and trivial  MVR.  CTA head/neck done today revealing no evidence of increase in bleeding, aortic atherosclerosis and 20% stenosis of proximal L-SA.  MBS done today and patient started on dysphagia 1, nectar liquids.  Patient with resultant right sided weakness with sensory deficits as well as severe dysarthria with minimal breath support--question aphasia. CIR recommended by therapy team due to functional decline.    Review of Systems  Unable to perform ROS: Mental acuity   Past Medical History:  Diagnosis Date  . Diabetes mellitus without complication (HCC)   . Hyperlipidemia   . Hypertension     History reviewed. No pertinent surgical history.    Family History  Problem Relation Age of Onset  . Healthy Sister       Social History: Lives with niece and helped take care of 3 young children under 19 years old. She was sedentary per reports. Has family living in the surrounding area, but working.   Per reports that  has never smoked. She has never used smokeless tobacco. Per reports she does not drink alcohol or use drugs.    Allergies: No Known Allergies    Medications Prior to Admission  Medication Sig Dispense Refill  . atorvastatin (LIPITOR) 20 MG tablet Take 20 mg by mouth daily.    Marland Kitchen  lisinopril-hydrochlorothiazide (PRINZIDE,ZESTORETIC) 10-12.5 MG tablet Take 1 tablet by mouth daily.    . metFORMIN (GLUCOPHAGE-XR) 500 MG 24 hr tablet Take 1,000 mg by mouth daily.    . metoprolol succinate (TOPROL-XL) 50 MG 24 hr tablet Take 50 mg by mouth daily. Take with or immediately following a meal.      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 Does not read. Takes care of a few children in her home. Granddaughter does IADLs. Fatigues quickly with mobility Functional Status:  Mobility: Bed Mobility Overal bed mobility: Needs Assistance Bed Mobility: Supine to Sit Supine to sit: Min assist, Mod assist, +2 for physical assistance General bed mobility comments: HOB elevated, pt able to bring LEs over to EOB, pt used L UE to pull up, modA to scoot to EOB Transfers Overall transfer level: Needs assistance Equipment used: (2 person lift with gait belt and bed pad) Transfers: Sit to/from Stand, Stand Pivot Transfers Sit to Stand: Mod assist, +2 physical assistance Stand pivot transfers: Mod assist, +2 physical assistance General transfer comment: pt able to power up, R LE with noted instability and knee buckling requiring therapist to block knee Ambulation/Gait General Gait Details: unable at this time  ADL: ADL Overall ADL's : Needs assistance/impaired Eating/Feeding: Minimal assistance, Sitting(supported sitting)  Cognition: Cognition Overall Cognitive Status: Impaired/Different from baseline Arousal/Alertness: Awake/alert Orientation Level: Oriented to person, Disoriented to time, Disoriented  to situation, Oriented to place Attention: Sustained, Selective Sustained Attention: Appears intact Selective Attention: Appears intact Memory: Appears intact Problem Solving: Appears intact Safety/Judgment: Appears intact Cognition Arousal/Alertness: Awake/alert Behavior During Therapy: WFL for tasks assessed/performed, Flat affect Overall Cognitive Status: Impaired/Different from baseline Area of Impairment: Following commands, Safety/judgement, Awareness, Problem solving, Attention Current Attention Level: Sustained Following Commands: Follows one step commands with increased time, Follows one step commands inconsistently Safety/Judgement: Decreased awareness of safety, Decreased awareness of deficits Awareness: Intellectual Problem Solving: Slow processing, Requires verbal cues, Requires tactile cues General Comments: Pt with slow processing and requires increased time and cues for follow commands. Pt motivated to participate    Blood pressure (!) 138/53, pulse 87, temperature 99 F (37.2 C), temperature source Oral, resp. rate 13, height 5\' 5"  (1.651 m), weight 70.8 kg (156 lb 1.4 oz), SpO2 96 %. Physical Exam  Nursing note and vitals reviewed. Constitutional: She appears well-developed and well-nourished. She appears lethargic. She is easily aroused. No distress.  Fatigued appearing and needed sternal rubs to arouse. Nurse reported multiple tests but sat up for an hour this afternoon.   HENT:  Head: Normocephalic and atraumatic.  Nose: Nose normal.  Eyes: Conjunctivae are normal. Pupils are equal, round, and reactive to light. Right eye exhibits no discharge. Left eye exhibits no discharge.  Neck: Normal range of motion. Neck supple.  Cardiovascular: Normal rate and regular rhythm.  Respiratory: Effort normal and breath sounds normal. No stridor. No respiratory distress.  GI: Soft. Bowel sounds are normal. She exhibits no distension. There is no tenderness.  Musculoskeletal:  She exhibits edema. She exhibits no tenderness.  Right hand with min edema.   Neurological: She is easily aroused. She appears lethargic.  Right facial weakness with right inattention.  She was oriented to self only.  Garbled speech.   Unable to follow simple motor commands consistently. Global aphasia Motor: (limited by language, and cooperation):  RUE appears to be 0/5 proxial to distal RLE: 1+/5 proximal to distal LUE: Likely 5/5 proxiaml to distal LLE: Likely 4/5 proximal to distal  Skin: Skin is warm and dry. No rash noted. She is not diaphoretic.  Psychiatric: Her affect is blunt. Her speech is delayed. She is slowed. Cognition and memory are impaired. She is inattentive.    Results for orders placed or performed during the hospital encounter of 09/02/17 (from the past 24 hour(s))  CBG monitoring, ED     Status: Abnormal   Collection Time: 09/02/17  6:10 PM  Result Value Ref Range   Glucose-Capillary 191 (H) 65 - 99 mg/dL   Comment 1 Notify RN    Comment 2 Document in Chart   Ethanol     Status: None   Collection Time: 09/02/17  6:11 PM  Result Value Ref Range   Alcohol, Ethyl (B) <10 <10 mg/dL  Protime-INR     Status: None   Collection Time: 09/02/17  6:11 PM  Result Value Ref Range   Prothrombin Time 12.3 11.4 - 15.2 seconds   INR 0.93   APTT     Status: None   Collection Time: 09/02/17  6:11 PM  Result Value Ref Range   aPTT 24 24 - 36 seconds  CBC     Status: Abnormal   Collection Time: 09/02/17  6:11 PM  Result Value Ref Range   WBC 14.2 (H) 4.0 - 10.5 K/uL   RBC 4.42 3.87 - 5.11 MIL/uL   Hemoglobin 12.7 12.0 - 15.0 g/dL   HCT 16.138.4 09.636.0 - 04.546.0 %   MCV 86.9 78.0 - 100.0 fL   MCH 28.7 26.0 - 34.0 pg   MCHC 33.1 30.0 - 36.0 g/dL   RDW 40.913.1 81.111.5 - 91.415.5 %   Platelets 180 150 - 400 K/uL  Differential     Status: Abnormal   Collection Time: 09/02/17  6:11 PM  Result Value Ref Range   Neutrophils Relative % 71 %   Neutro Abs 10.1 (H) 1.7 - 7.7 K/uL    Lymphocytes Relative 22 %   Lymphs Abs 3.1 0.7 - 4.0 K/uL   Monocytes Relative 5 %   Monocytes Absolute 0.8 0.1 - 1.0 K/uL   Eosinophils Relative 2 %   Eosinophils Absolute 0.3 0.0 - 0.7 K/uL   Basophils Relative 0 %   Basophils Absolute 0.1 0.0 - 0.1 K/uL  Comprehensive metabolic panel     Status: Abnormal   Collection Time: 09/02/17  6:11 PM  Result Value Ref Range   Sodium 138 135 - 145 mmol/L   Potassium 4.0 3.5 - 5.1 mmol/L   Chloride 104 101 - 111 mmol/L   CO2 23 22 - 32 mmol/L   Glucose, Bld 203 (H) 65 - 99 mg/dL   BUN 19 6 - 20 mg/dL   Creatinine, Ser 7.820.92 0.44 - 1.00 mg/dL   Calcium 9.0 8.9 - 95.610.3 mg/dL   Total Protein 7.0 6.5 - 8.1 g/dL   Albumin 3.4 (L) 3.5 - 5.0 g/dL   AST 25 15 - 41 U/L   ALT 20 14 - 54 U/L   Alkaline Phosphatase 78 38 - 126 U/L   Total Bilirubin <0.1 (L) 0.3 - 1.2 mg/dL   GFR calc non Af Amer >60 >60 mL/min   GFR calc Af Amer >60 >60 mL/min   Anion gap 11 5 - 15  I-stat troponin, ED     Status: None   Collection Time: 09/02/17  6:17 PM  Result Value Ref Range   Troponin i, poc 0.00 0.00 - 0.08 ng/mL   Comment 3          I-Stat Chem 8, ED     Status: Abnormal   Collection Time: 09/02/17  6:19 PM  Result Value Ref Range   Sodium 140 135 - 145 mmol/L   Potassium 4.0 3.5 - 5.1 mmol/L   Chloride 104 101 - 111 mmol/L   BUN 20 6 - 20 mg/dL   Creatinine, Ser 2.130.80 0.44 - 1.00 mg/dL   Glucose, Bld 086212 (H) 65 - 99 mg/dL   Calcium, Ion 5.781.12 (L) 1.15 - 1.40 mmol/L   TCO2 23 22 - 32 mmol/L   Hemoglobin 13.3 12.0 - 15.0 g/dL   HCT 46.939.0 62.936.0 - 52.846.0 %  MRSA PCR Screening     Status: None   Collection Time: 09/02/17  8:28 PM  Result Value Ref Range   MRSA by PCR NEGATIVE NEGATIVE  Glucose, capillary     Status: Abnormal   Collection Time: 09/02/17 11:54 PM  Result Value Ref Range   Glucose-Capillary 124 (H) 65 - 99 mg/dL  Glucose, capillary     Status: Abnormal   Collection Time: 09/03/17  3:57 AM  Result Value Ref Range   Glucose-Capillary 112  (H) 65 - 99 mg/dL  Lipid panel     Status:  None   Collection Time: 09/03/17  8:04 AM  Result Value Ref Range   Cholesterol 114 0 - 200 mg/dL   Triglycerides 846 <962 mg/dL   HDL 44 >95 mg/dL   Total CHOL/HDL Ratio 2.6 RATIO   VLDL 23 0 - 40 mg/dL   LDL Cholesterol 47 0 - 99 mg/dL  Hemoglobin M8U     Status: Abnormal   Collection Time: 09/03/17  8:04 AM  Result Value Ref Range   Hgb A1c MFr Bld 7.1 (H) 4.8 - 5.6 %   Mean Plasma Glucose 157.07 mg/dL  Urine rapid drug screen (hosp performed)     Status: None   Collection Time: 09/03/17  8:08 AM  Result Value Ref Range   Opiates NONE DETECTED NONE DETECTED   Cocaine NONE DETECTED NONE DETECTED   Benzodiazepines NONE DETECTED NONE DETECTED   Amphetamines NONE DETECTED NONE DETECTED   Tetrahydrocannabinol NONE DETECTED NONE DETECTED   Barbiturates NONE DETECTED NONE DETECTED  Urinalysis, Routine w reflex microscopic     Status: Abnormal   Collection Time: 09/03/17  8:08 AM  Result Value Ref Range   Color, Urine YELLOW YELLOW   APPearance CLEAR CLEAR   Specific Gravity, Urine 1.012 1.005 - 1.030   pH 5.0 5.0 - 8.0   Glucose, UA NEGATIVE NEGATIVE mg/dL   Hgb urine dipstick SMALL (A) NEGATIVE   Bilirubin Urine NEGATIVE NEGATIVE   Ketones, ur NEGATIVE NEGATIVE mg/dL   Protein, ur 30 (A) NEGATIVE mg/dL   Nitrite NEGATIVE NEGATIVE   Leukocytes, UA NEGATIVE NEGATIVE   RBC / HPF 0-5 0 - 5 RBC/hpf   WBC, UA 0-5 0 - 5 WBC/hpf   Bacteria, UA RARE (A) NONE SEEN   Squamous Epithelial / LPF 0-5 (A) NONE SEEN   Uric Acid Crys, UA PRESENT   Glucose, capillary     Status: Abnormal   Collection Time: 09/03/17  8:12 AM  Result Value Ref Range   Glucose-Capillary 125 (H) 65 - 99 mg/dL   Comment 1 Notify RN    Comment 2 Document in Chart   Glucose, capillary     Status: Abnormal   Collection Time: 09/03/17 11:43 AM  Result Value Ref Range   Glucose-Capillary 148 (H) 65 - 99 mg/dL   Comment 1 Notify RN    Comment 2 Document in Chart     Glucose, capillary     Status: Abnormal   Collection Time: 09/03/17  3:42 PM  Result Value Ref Range   Glucose-Capillary 114 (H) 65 - 99 mg/dL   Comment 1 Notify RN    Comment 2 Document in Chart    Ct Angio Head W Or Wo Contrast  Result Date: 09/03/2017 CLINICAL DATA:  Stroke. Expressive aphasia. Right-sided weakness. Left basal ganglia hemorrhage. EXAM: CT ANGIOGRAPHY HEAD AND NECK TECHNIQUE: Multidetector CT imaging of the head and neck was performed using the standard protocol during bolus administration of intravenous contrast. Multiplanar CT image reconstructions and MIPs were obtained to evaluate the vascular anatomy. Carotid stenosis measurements (when applicable) are obtained utilizing NASCET criteria, using the distal internal carotid diameter as the denominator. CONTRAST:  50 cc Isovue-300 COMPARISON:  CT 09/02/2017 FINDINGS: CTA NECK FINDINGS Aortic arch: Aortic atherosclerosis. No aneurysm or dissection. 20% stenosis of the left subclavian artery origin. Right carotid system: Common carotid artery shows atherosclerotic plaque but no stenosis. Carotid bifurcation shows mild atherosclerotic plaque but no stenosis. Cervical internal carotid artery is tortuous but widely patent. Left carotid system: Common carotid artery shows  plaque but no stenosis. Carotid bifurcation shows a small focus of calcified plaque but no stenosis or irregularity. Cervical ICA is tortuous but widely patent. Vertebral arteries: Left vertebral artery is dominant. Left vertebral artery origin is widely patent in the vessel is widely patent through the cervical region. Right vertebral artery is a very small vessel which is patent at its origin and through the cervical region to the foramen magnum. Skeleton: Minimal cervical spondylosis. Other neck: Contrast present in the esophagus from previous speech pathology study. No soft tissue neck lesion. Upper chest: Minimal scarring at the apices. Review of the MIP images  confirms the above findings CTA HEAD FINDINGS Anterior circulation: Both internal carotid arteries are patent through the skullbase and siphon regions. There is ordinary peripheral atherosclerotic calcification in the carotid siphons but no stenosis more than about 20%. The anterior and middle cerebral vessels are patent without proximal stenosis, aneurysm or vascular malformation. No missing branch vessels are identified. Posterior circulation: The right vertebral artery terminates in PICA. Left vertebral artery supplies left PICA thin becomes the basilar. No basilar stenosis. Posterior circulation branch vessels are patent without proximal stenosis. Venous sinuses: Patent and normal. Anatomic variants: None significant Delayed phase: No abnormal enhancement. Left basal ganglia hemorrhage is no larger. Hematoma today measures 24 x 21 x 20 mm. Mild surrounding edema. No change in mass effect. Left-to-right shift of 1 or 2 mm. Review of the MIP images confirms the above findings IMPRESSION: No evidence of increased bleeding in the left basal ganglia. No evidence of increased mass effect. Aortic atherosclerosis. 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 Atherosclerotic plaque affecting the common carotid arteries but without stenosis. Both carotid bifurcations are widely patent. No posterior circulation compromise. Electronically Signed   By: Paulina Fusi M.D.   On: 09/03/2017 14:43   Dg Chest 1 View  Result Date: 09/03/2017 CLINICAL DATA:  Weakness and shortness of breath EXAM: CHEST 1 VIEW COMPARISON:  None in PACs FINDINGS: The lungs are well-expanded there is mild biapical pleural thickening. There are coarse lung markings in the right lower lung partially obscuring the right heart border. There is no pleural effusion or pneumothorax. The heart is top-normal in size. There is calcification in the wall of the aortic arch. The bony thorax exhibits no acute abnormality. IMPRESSION: Right middle lobe  atelectasis or pneumonia. No CHF. Followup PA and lateral chest X-ray is recommended in 3-4 weeks following trial of antibiotic therapy to ensure resolution and exclude underlying malignancy. Thoracic aortic atherosclerosis. Electronically Signed   By: David  [place] M.D.   On: 09/03/2017 14:03   Ct Angio Neck W Or Wo Contrast  Result Date: 09/03/2017 CLINICAL DATA:  Stroke. Expressive aphasia. Right-sided weakness. Left basal ganglia hemorrhage. EXAM: CT ANGIOGRAPHY HEAD AND NECK TECHNIQUE: Multidetector CT imaging of the head and neck was performed using the standard protocol during bolus administration of intravenous contrast. Multiplanar CT image reconstructions and MIPs were obtained to evaluate the vascular anatomy. Carotid stenosis measurements (when applicable) are obtained utilizing NASCET criteria, using the distal internal carotid diameter as the denominator. CONTRAST:  50 cc Isovue-300 COMPARISON:  CT 09/02/2017 FINDINGS: CTA NECK FINDINGS Aortic arch: Aortic atherosclerosis. No aneurysm or dissection. 20% stenosis of the left subclavian artery origin. Right carotid system: Common carotid artery shows atherosclerotic plaque but no stenosis. Carotid bifurcation shows mild atherosclerotic plaque but no stenosis. Cervical internal carotid artery is tortuous but widely patent. Left carotid system: Common carotid artery shows plaque but no stenosis. Carotid  bifurcation shows a small focus of calcified plaque but no stenosis or irregularity. Cervical ICA is tortuous but widely patent. Vertebral arteries: Left vertebral artery is dominant. Left vertebral artery origin is widely patent in the vessel is widely patent through the cervical region. Right vertebral artery is a very small vessel which is patent at its origin and through the cervical region to the foramen magnum. Skeleton: Minimal cervical spondylosis. Other neck: Contrast present in the esophagus from previous speech pathology study. No soft  tissue neck lesion. Upper chest: Minimal scarring at the apices. Review of the MIP images confirms the above findings CTA HEAD FINDINGS Anterior circulation: Both internal carotid arteries are patent through the skullbase and siphon regions. There is ordinary peripheral atherosclerotic calcification in the carotid siphons but no stenosis more than about 20%. The anterior and middle cerebral vessels are patent without proximal stenosis, aneurysm or vascular malformation. No missing branch vessels are identified. Posterior circulation: The right vertebral artery terminates in PICA. Left vertebral artery supplies left PICA thin becomes the basilar. No basilar stenosis. Posterior circulation branch vessels are patent without proximal stenosis. Venous sinuses: Patent and normal. Anatomic variants: None significant Delayed phase: No abnormal enhancement. Left basal ganglia hemorrhage is no larger. Hematoma today measures 24 x 21 x 20 mm. Mild surrounding edema. No change in mass effect. Left-to-right shift of 1 or 2 mm. Review of the MIP images confirms the above findings IMPRESSION: No evidence of increased bleeding in the left basal ganglia. No evidence of increased mass effect. Aortic atherosclerosis. 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 Atherosclerotic plaque affecting the common carotid arteries but without stenosis. Both carotid bifurcations are widely patent. No posterior circulation compromise. Electronically Signed   By: Paulina Fusi M.D.   On: 09/03/2017 14:43   Dg Swallowing Func-speech Pathology  Result Date: 09/03/2017 Objective Swallowing Evaluation: Type of Study: MBS-Modified Barium Swallow Study  Patient Details Name: Bay Jarquin MRN: [id number] Date of Birth: Aug 05, 1951 Today's Date: 09/03/2017 Time: SLP Start Time (ACUTE ONLY): 1330 -SLP Stop Time (ACUTE ONLY): 1350 SLP Time Calculation (min) (ACUTE ONLY): 20 min Past Medical History: Past Medical History: Diagnosis Date .  Diabetes mellitus without complication (HCC)  . Hyperlipidemia  . Hypertension  Past Surgical History: No past surgical history on file. HPI: 66 year old female with basal ganglia hemorrhage, likely hypertensive in etiology. CT shows Hyperdense 2.7 cm area of hemorrhage in the posterior left lentiform nuclei with surrounding edema.  No Data Recorded Assessment / Plan / Recommendation CHL IP CLINICAL IMPRESSIONS 09/03/2017 Clinical Impression   Pt demonstrates a moderate oral dysphagia due to impaired lingual coordination for bolus propulsion. Preparation of solids is laborius and results in significant premature spillage of bolus mixing with saliva prior to swallow initaition, posing significant aspiration risk. Oropharyngeal function is strong, but swallow initiation is delayed. Depending on method of intake (cup vs straw), rate and bolus size pt has instances of sensed aspiration before the swallow. Respiratory drive is impaired for volitional and reflexive coughing and aspirate is not fully expectorated. Pt tolerates nectar straw well and can consume thin liquids with a straw and a chin tuck. Will initiate a conservative diet of nectar thick liquids and puree and advance at bedside as pt exhibits consistency with a chin tuck. Pt would also benefit from EMST to improve expiratory drive.  tuck. Pt would also benefit from EMST to improve expiratory drive.  SLP Visit Diagnosis Dysphagia, oropharyngeal phase (R13.12) Attention and concentration deficit following -- Frontal  lobe and executive function deficit following -- Impact on safety and function Moderate aspiration risk   CHL IP TREATMENT RECOMMENDATION 09/03/2017 Treatment Recommendations Therapy as outlined in treatment plan below   Prognosis 09/03/2017 Prognosis for Safe Diet Advancement Good Barriers to Reach Goals -- Barriers/Prognosis Comment -- CHL IP DIET RECOMMENDATION 09/03/2017 SLP Diet Recommendations Dysphagia 1 (Puree) solids;Nectar thick liquid  Liquid Administration via Straw Medication Administration Whole meds with puree Compensations Slow rate;Small sips/bites;Use straw to facilitate chin tuck Postural Changes Seated upright at 90 degrees   CHL IP OTHER RECOMMENDATIONS 09/03/2017 Recommended Consults -- Oral Care Recommendations Oral care BID Other Recommendations --   CHL IP FOLLOW UP RECOMMENDATIONS 09/03/2017 Follow up Recommendations Inpatient Rehab   CHL IP FREQUENCY AND DURATION 09/03/2017 Speech Therapy Frequency (ACUTE ONLY) min 2x/week Treatment Duration 2 weeks      CHL IP ORAL PHASE 09/03/2017 Oral Phase Impaired Oral - Pudding Teaspoon -- Oral - Pudding Cup -- Oral - Honey Teaspoon Reduced posterior propulsion;Delayed oral transit Oral - Honey Cup -- Oral - Nectar Teaspoon Reduced posterior propulsion;Delayed oral transit Oral - Nectar Cup -- Oral - Nectar Straw Reduced posterior propulsion;Delayed oral transit Oral - Thin Teaspoon -- Oral - Thin Cup Reduced posterior propulsion;Delayed oral transit Oral - Thin Straw Delayed oral transit Oral - Puree Delayed oral transit Oral - Mech Soft Delayed oral transit;Weak lingual manipulation;Impaired mastication Oral - Regular -- Oral - Multi-Consistency -- Oral - Pill -- Oral Phase - Comment --  CHL IP PHARYNGEAL PHASE 09/03/2017 Pharyngeal Phase Impaired Pharyngeal- Pudding Teaspoon -- Pharyngeal -- Pharyngeal- Pudding Cup -- Pharyngeal -- Pharyngeal- Honey Teaspoon Delayed swallow initiation-vallecula Pharyngeal -- Pharyngeal- Honey Cup -- Pharyngeal -- Pharyngeal- Nectar Teaspoon Delayed swallow initiation-vallecula Pharyngeal Material does not enter airway Pharyngeal- Nectar Cup -- Pharyngeal -- Pharyngeal- Nectar Straw Delayed swallow initiation-vallecula;Delayed swallow initiation-pyriform sinuses;Penetration/Aspiration before swallow Pharyngeal Material enters airway, remains ABOVE vocal cords then ejected out;Material does not enter airway Pharyngeal- Thin Teaspoon -- Pharyngeal --  Pharyngeal- Thin Cup Penetration/Aspiration before swallow;Delayed swallow initiation-pyriform sinuses;Moderate aspiration Pharyngeal Material enters airway, passes BELOW cords and not ejected out despite cough attempt by patient Pharyngeal- Thin Straw Delayed swallow initiation-pyriform sinuses;Penetration/Aspiration before swallow;Compensatory strategies attempted (with notebox) Pharyngeal Material enters airway, CONTACTS cords and then ejected out;Material does not enter airway Pharyngeal- Puree Delayed swallow initiation-vallecula Pharyngeal -- Pharyngeal- Mechanical Soft Delayed swallow initiation-pyriform sinuses Pharyngeal -- Pharyngeal- Regular -- Pharyngeal -- Pharyngeal- Multi-consistency -- Pharyngeal -- Pharyngeal- Pill -- Pharyngeal -- Pharyngeal Comment --  No flowsheet data found. No flowsheet data found. Harlon Ditty, MA CCC-SLP [phone] [place], Riley Nearing 09/03/2017, 2:50 PM              Ct Head Code Stroke Wo Contrast  Addendum Date: 09/02/2017   ADDENDUM REPORT: 09/02/2017 18:39 ADDENDUM: Critical Value/emergent results were called by telephone at the time of interpretation on 09/02/2017 at 1629 hours to Dr. Raeford Razor , who verbally acknowledged these results. Electronically Signed   By: Odessa Fleming M.D.   On: 09/02/2017 18:39   Result Date: 09/02/2017 CLINICAL DATA:  Code stroke. 66 year old female, nonverbal, Spanish-speaking. EXAM: CT HEAD WITHOUT CONTRAST TECHNIQUE: Contiguous axial images were obtained from the base of the skull through the vertex without intravenous contrast. COMPARISON:  None. FINDINGS: Brain: Somewhat Triangular-shaped hyperdense hemorrhage in the left posterior lentiform encompassing 27 x 20 x 23 mm (estimated blood volume 6 mL) with surrounding edema. Mild regional mass effect. No midline shift. Patent basilar cisterns. No intraventricular or extra-axial extension  identified. No ventriculomegaly. Superimposed confluent bilateral cerebral white matter  hypodensity. Superimposed small area of chronic cortical encephalomalacia in the posterior left frontal or anterior parietal lobe (sagittal image 47). No other cortical encephalomalacia identified. No cortically based acute infarct identified. Vascular: Calcified atherosclerosis at the skull base. The distal left vertebral artery appears dominant. No suspicious intracranial vascular hyperdensity. Skull: No acute osseous abnormality identified. Sinuses/Orbits: Mild bilateral ethmoid sinus mucosal thickening. Moderate right sphenoid mucosal thickening. No sinus fluid levels. Chronic appearing sclerosis of the mastoids, with chronic coalescence on the right. Other: Leftward gaze deviation. Visualized scalp soft tissues are within normal limits. ASPECTS [facility] Stroke Program Early CT Score): Not applicable, acute hemorrhage. IMPRESSION: 1. Hyperdense 2.7 cm area of hemorrhage in the posterior left lentiform nuclei with surrounding edema. Mild regional mass effect with no midline shift. No intraventricular or extra-axial extension of hemorrhage. 2. Advanced bilateral cerebral white matter disease and small area of chronic cortical encephalomalacia in the posterior left MCA territory. 3. ASPECTS is not applicable, acute hemorrhage. Electronically Signed: By: Odessa Fleming M.D. On: 09/02/2017 18:27    Assessment/Plan: Diagnosis: Left basal ganglia hemorrhage  Labs and images independently reviewed.  Records reviewed and summated above. Stroke: Continue secondary stroke prophylaxis and Risk Factor Modification listed below:   Blood Pressure Management:  Continue current medication with prn's with permisive HTN per primary team Diabetes management:   Right sided hemiparesis: fit for orthosis to prevent contractures (resting hand splint for day, wrist cock up splint at night, PRAFO, etc) Motor recovery: Fluoxetine  1. Does the need for close, 24 hr/day medical supervision in concert with the patient's rehab needs  make it unreasonable for this patient to be served in a less intensive setting? Yes  2. Co-Morbidities requiring supervision/potential complications: dysphagia (advance diet as tolerated), T2 DM (Monitor in accordance with exercise and adjust meds as necessary), HTN (monitor and provide prns in accordance with increased physical exertion and pain), leukocytosis (cont to monitor for signs and symptoms of infection, further workup if indicated) 3. Due to bladder management, safety, skin/wound care, disease management, medication administration, pain management and patient education, does the patient require 24 hr/day rehab nursing? Yes 4. Does the patient require coordinated care of a physician, rehab nurse, PT (1-2 hrs/day, 5 days/week), OT (1-2 hrs/day, 5 days/week) and SLP (1-2 hrs/day, 5 days/week) to address physical and functional deficits in the context of the above medical diagnosis(es)? Yes Addressing deficits in the following areas: balance, endurance, locomotion, strength, transferring, bowel/bladder control, bathing, dressing, feeding, grooming, toileting, cognition, speech, language, swallowing and psychosocial support 5. Can the patient actively participate in an intensive therapy program of at least 3 hrs of therapy per day at least 5 days per week? Potentially 6. The potential for patient to make measurable gains while on inpatient rehab is excellent 7. Anticipated functional outcomes upon discharge from inpatient rehab are min assist  with PT, supervision and min assist with OT, supervision with SLP. 8. Estimated rehab length of stay to reach the above functional goals is: 15-20 days. 9. Anticipated D/C setting: Home 10. Anticipated post D/C treatments: HH therapy and Home excercise program 11. Overall Rehab/Functional Prognosis: good  RECOMMENDATIONS: This patient's condition is appropriate for continued rehabilitative care in the following setting: CIR when medically appropriate and  able to tolerate 3 hours of therapy/day. Patient has agreed to participate in recommended program. Potentially Note that insurance prior authorization may be required for reimbursement for recommended care.  Comment: Rehab Admissions Coordinator to  follow up.  Maryla Morrow, MD, ABPMR Jacquelynn Cree, [place] 09/03/2017

## 2017-09-03 NOTE — Progress Notes (Signed)
OT Cancellation Note  Patient Details Name: Cindy Thornton MRN: [id number] DOB: 02/09/1951   Cancelled Treatment:    Reason Eval/Treat Not Completed: Patient not medically ready. Pt with active bedrest orders. Will await increase in activity orders prior to initiating OT evaluation. Thank you for this referral!  Doristine Sectionharity A Tareka Jhaveri, MS OTR/L  Pager: [phone]   Lovie Agresta A Kashon Kraynak 09/03/2017, 6:42 AM

## 2017-09-03 NOTE — Progress Notes (Signed)
Rehab Admissions Coordinator Note:  Patient was screened by Trish MageLogue, Marrian Bells M for appropriateness for an Inpatient Acute Rehab Consult.  At this time, we are recommending Inpatient Rehab consult.  Trish MageLogue, Davell Beckstead M 09/03/2017, 2:35 PM  I can be reached at [phone].

## 2017-09-03 NOTE — Progress Notes (Signed)
  Echocardiogram 2D Echocardiogram has been performed.  Therese Rocco L Androw 09/03/2017, 10:05 AM

## 2017-09-03 NOTE — Evaluation (Signed)
Occupational Therapy Evaluation Patient Details Name: Cindy Thornton MRN: [id number] DOB: 1951-05-24 Today's Date: 09/03/2017    History of Present Illness 66 year old female with basal ganglia hemorrhage, likely hypertensive in etiology. CT shows Hyperdense 2.7 cm area of hemorrhage in the posterior left lentiform nuclei with surrounding edema.    Clinical Impression   PTA, pt was living with her granddaughter and was independent with her ADLs. Pt spanish speaking and SLP acted as Nurse, learning disability for [employer]. Pt presenting with decreased balance, R hemiparesis, and poor coordination. Pt requiring Mod A for UB ADLs, Max A +2 for LB ADLs, and Max A +2 for stand pivot transfer. Pt will require further acute OT to facilitate safe dc and increase pt's occupational performance. Recommend dc to CIR for intensive OT to optimize safety and independence with ADLs and functional mobility.    Follow Up Recommendations  CIR    Equipment Recommendations  Other (comment)(Defer to next venue)    Recommendations for Other Services PT consult;Rehab consult;Speech consult     Precautions / Restrictions Precautions Precautions: Fall Precaution Comments: spanish speaking only Restrictions Weight Bearing Restrictions: No      Mobility Bed Mobility Overal bed mobility: Needs Assistance Bed Mobility: Supine to Sit     Supine to sit: Min assist;Mod assist;+2 for physical assistance     General bed mobility comments: HOB elevated, pt able to bring LEs over to EOB, pt used L UE to pull up, modA to scoot to EOB  Transfers Overall transfer level: Needs assistance Equipment used: (2 person lift with gait belt and bed pad) Transfers: Sit to/from [employer] Sit to Stand: Mod assist;+2 physical assistance Stand pivot transfers: Mod assist;+2 physical assistance       General transfer comment: pt able to power up, R LE with noted instability and knee buckling requiring therapist  to block knee    Balance Overall balance assessment: Needs assistance Sitting-balance support: No upper extremity supported Sitting balance-Leahy Scale: Fair Sitting balance - Comments: pt able to maintain midline without physical assist and Min guard for safety   Standing balance support: Bilateral upper extremity supported;During functional activity Standing balance-Leahy Scale: Zero Standing balance comment: Reliant on physical assistance for standing                           ADL either performed or assessed with clinical judgement   ADL Overall ADL's : Needs assistance/impaired Eating/Feeding: Minimal assistance;Sitting(supported sitting) Eating/Feeding Details (indicate cue type and reason): Pt able to bring spoon to mouth with LUE Grooming: Moderate assistance;Sitting Grooming Details (indicate cue type and reason): Pt able to maintain sitting at EOB with Min Guard A for safety. Pt requiring increased assistance for bilateral activities due to poor funcitonal use of RUE (dominant) with brunnstromm stage 1 Upper Body Bathing: Moderate assistance;Sitting   Lower Body Bathing: Maximal assistance;Sit to/from stand;+2 for physical assistance   Upper Body Dressing : Moderate assistance;Sitting   Lower Body Dressing: Maximal assistance;Sit to/from stand;+2 for physical assistance   Toilet Transfer: Moderate assistance;+2 for physical assistance;Stand-pivot(simulated to recliner) Toilet Transfer Details (indicate cue type and reason): Pt performing stand pivot to reclienr with 2 person hand held assistance and blocking of R knee         Functional mobility during ADLs: Moderate assistance;+2 for physical assistance;Cueing for sequencing(2 person hand held assist; stand pivot only) General ADL Comments: Pt demonstrating decreased fucnitonal performance and decreased balance, cognition, and fucntional use of RUE  Vision   Additional Comments: Pt denies any visual  deficits     Perception     Praxis      Pertinent Vitals/Pain Pain Assessment: No/denies pain     Hand Dominance Right   Extremity/Trunk Assessment Upper Extremity Assessment Upper Extremity Assessment: Overall WFL for tasks assessed;RUE deficits/detail RUE Deficits / Details: No AROM of RUE. 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 RUE Sensation: decreased light touch RUE Coordination: decreased fine motor;decreased gross motor   Lower Extremity Assessment Lower Extremity Assessment: Defer to PT evaluation RLE Deficits / Details: pt with initation of LAQ in sitting but no hip or ankle flexion   Cervical / Trunk Assessment Cervical / Trunk Assessment: Other exceptions Cervical / Trunk Exceptions: R lateral lean   Communication Communication Communication: Expressive difficulties   Cognition Arousal/Alertness: Awake/alert Behavior During Therapy: WFL for tasks assessed/performed;Flat affect Overall Cognitive Status: Impaired/Different from baseline Area of Impairment: Following commands;Safety/judgement;Awareness;Problem solving;Attention                   Current Attention Level: Sustained   Following Commands: Follows one step commands with increased time;Follows one step commands inconsistently Safety/Judgement: Decreased awareness of safety;Decreased awareness of deficits Awareness: Intellectual Problem Solving: Slow processing;Requires verbal cues;Requires tactile cues General Comments: Pt with slow processing and requires increased time and cues for follow commands. Pt motivated to participate    General Comments  Pt family present throughout session. Interpreter used for language barrier    Exercises     Shoulder Instructions      Home Living Family/patient expects to be discharged to:: Inpatient rehab Living Arrangements: Other relatives(granddaughter) Available Help at Discharge: Family;Available PRN/intermittently                              Additional Comments: pt lives with granddaughter and is indep with mobiltiy and ADLs, pt watches children in the home  Lives With: Family    Prior Functioning/Environment Level of Independence: Independent        Comments: ADLs. Does not read. Takes care of a few children in her home. Granddaughter does IADLs. Fatigues quickly with mobility        OT Problem List: Decreased strength;Decreased range of motion;Decreased activity tolerance;Impaired balance (sitting and/or standing);Decreased coordination;Decreased cognition;Decreased safety awareness;Decreased knowledge of use of DME or AE;Decreased knowledge of precautions;Impaired UE functional use      OT Treatment/Interventions: Self-care/ADL training;Therapeutic exercise;Energy conservation;DME and/or AE instruction;Therapeutic activities;Patient/family education    OT Goals(Current goals can be found in the care plan section) Acute Rehab OT Goals Patient Stated Goal: didn't state OT Goal Formulation: With patient Time For Goal Achievement: 09/17/17 Potential to Achieve Goals: Good ADL Goals Pt Will Perform Grooming: with min assist;sitting(Using RUE as dependent assist) Pt Will Perform Upper Body Bathing: with min assist;sitting Pt Will Perform Upper Body Dressing: with min assist;sitting Pt Will Transfer to Toilet: with min assist;with +2 assist;ambulating;bedside commode Additional ADL Goal #1: Pt will perform bed mobility with Min A in preparation for ADLs in sitting  OT Frequency: Min 3X/week   Barriers to D/C:            Co-evaluation PT/OT/SLP Co-Evaluation/Treatment: Yes Reason for Co-Treatment: Complexity of the patient's impairments (multi-system involvement);For patient/therapist safety PT goals addressed during session: Mobility/safety with mobility OT goals addressed during session: ADL's and self-care      AM-PAC PT "6 Clicks" Daily Activity     Outcome  Measure Help from  another person eating meals?: Total Help from another person taking care of personal grooming?: A Lot Help from another person toileting, which includes using toliet, bedpan, or urinal?: A Lot Help from another person bathing (including washing, rinsing, drying)?: A Lot Help from another person to put on and taking off regular upper body clothing?: A Lot Help from another person to put on and taking off regular lower body clothing?: A Lot 6 Click Score: 11   End of Session Equipment Utilized During Treatment: Gait belt Nurse Communication: Mobility status;Precautions  Activity Tolerance: Patient tolerated treatment well Patient left: in chair;with call bell/phone within reach;with nursing/sitter in room;with family/visitor present  OT Visit Diagnosis: Unsteadiness on feet (R26.81);Other abnormalities of gait and mobility (R26.89);Muscle weakness (generalized) (M62.81);Other symptoms and signs involving cognitive function;Hemiplegia and hemiparesis Hemiplegia - Right/Left: Right Hemiplegia - dominant/non-dominant: Dominant Hemiplegia - caused by: Cerebral infarction                Time: 1157-1222 OT Time Calculation (min): 25 min Charges:  OT General Charges $OT Visit: 1 Visit OT Evaluation $OT Eval Moderate Complexity: 1 Mod G-Codes:     Marthella Osorno MSOT, OTR/L Acute Rehab Pager: [phone] Office: [phone]  Theodoro GristCharis M Tynisha Ogan 09/03/2017, 4:27 PM

## 2017-09-04 ENCOUNTER — Inpatient Hospital Stay (HOSPITAL_COMMUNITY): Payer: Medicaid Other

## 2017-09-04 LAB — BASIC METABOLIC PANEL
ANION GAP: 9 (ref 5–15)
BUN: 16 mg/dL (ref 6–20)
CALCIUM: 9.3 mg/dL (ref 8.9–10.3)
CO2: 26 mmol/L (ref 22–32)
CREATININE: 0.78 mg/dL (ref 0.44–1.00)
Chloride: 105 mmol/L (ref 101–111)
GFR calc Af Amer: >60 mL/min (ref >60)
GLUCOSE: 124 mg/dL — AB (ref 65–99)
Potassium: 3.4 mmol/L — ABNORMAL LOW (ref 3.5–5.1)
Sodium: 140 mmol/L (ref 135–145)

## 2017-09-04 LAB — GLUCOSE, CAPILLARY
GLUCOSE-CAPILLARY: 128 mg/dL — AB (ref 65–99)
Glucose-Capillary: 137 mg/dL — ABNORMAL HIGH (ref 65–99)
Glucose-Capillary: 157 mg/dL — ABNORMAL HIGH (ref 65–99)
Glucose-Capillary: 175 mg/dL — ABNORMAL HIGH (ref 65–99)
Glucose-Capillary: 85 mg/dL (ref 65–99)

## 2017-09-04 LAB — CBC
HCT: 39.1 % (ref 36.0–46.0)
HEMOGLOBIN: 13.2 g/dL (ref 12.0–15.0)
MCH: 28.8 pg (ref 26.0–34.0)
MCHC: 33.8 g/dL (ref 30.0–36.0)
MCV: 85.4 fL (ref 78.0–100.0)
Platelets: 199 10*3/uL (ref 150–400)
RBC: 4.58 MIL/uL (ref 3.87–5.11)
RDW: 13.3 % (ref 11.5–15.5)
WBC: 9.3 10*3/uL (ref 4.0–10.5)

## 2017-09-04 LAB — MAGNESIUM: Magnesium: 2 mg/dL (ref 1.7–2.4)

## 2017-09-04 LAB — PHOSPHORUS: Phosphorus: 4.6 mg/dL (ref 2.5–4.6)

## 2017-09-04 MED ORDER — HYDROCHLOROTHIAZIDE 12.5 MG PO CAPS
12.5000 mg | ORAL_CAPSULE | Freq: Every day | ORAL | Status: DC
  Administered 2017-09-04 – 2017-09-06 (×3): 12.5 mg via ORAL
  Filled 2017-09-04 (×3): qty 1

## 2017-09-04 MED ORDER — LISINOPRIL 10 MG PO TABS
10.0000 mg | ORAL_TABLET | Freq: Every day | ORAL | Status: DC
  Administered 2017-09-04: 10 mg via ORAL
  Filled 2017-09-04 (×2): qty 1

## 2017-09-04 MED ORDER — PANTOPRAZOLE SODIUM 40 MG PO TBEC
40.0000 mg | DELAYED_RELEASE_TABLET | Freq: Every day | ORAL | Status: DC
  Administered 2017-09-04 – 2017-09-05 (×2): 40 mg via ORAL
  Filled 2017-09-04 (×2): qty 1

## 2017-09-04 MED ORDER — METOPROLOL SUCCINATE ER 25 MG PO TB24
50.0000 mg | ORAL_TABLET | Freq: Every day | ORAL | Status: DC
  Administered 2017-09-04 – 2017-09-06 (×3): 50 mg via ORAL
  Filled 2017-09-04: qty 2
  Filled 2017-09-04: qty 1
  Filled 2017-09-04: qty 2

## 2017-09-04 MED ORDER — LISINOPRIL-HYDROCHLOROTHIAZIDE 10-12.5 MG PO TABS: 1.0000 | ORAL_TABLET | Freq: Every day | ORAL | Status: DC

## 2017-09-04 MED ORDER — POTASSIUM CHLORIDE 10 MEQ/100ML IV SOLN
10.0000 meq | INTRAVENOUS | Status: AC
  Administered 2017-09-04 (×3): 10 meq via INTRAVENOUS
  Filled 2017-09-04 (×3): qty 100

## 2017-09-04 NOTE — Progress Notes (Signed)
  Speech Language Pathology Treatment: Dysphagia;Cognitive-Linquistic  Patient Details Name: Cindy Thornton MRN: [id number] DOB: October 20, 1950 Today's Date: 09/04/2017 Time: [id number] SLP Time Calculation (min) (ACUTE ONLY): 25 min  Assessment / Plan / Recommendation Clinical Impression  Pt demonstrates improving breath support for audible speech. Provided max verbal cues for pt to "shout" single words with success in 3/3 trials. Pt is unintelligible at phrase level with phonemic paraphasias and jargon. Responded well to phonemic cues at word level, naming objects in the room and body parts. Instructed son on phonemic cues. Pt return demonstrated chin tuck consistently with verbal reasoning in 100% of trials with no signs of aspiration. Pt also able to masticate soft fruit without residuals or coughing. Recommend pt upgrade to dys 2 (fine chop) with ongoing use of nectar thick liquids with meals, though pt may have sips of water with assist from RN or family for a chin tuck.    HPI HPI: 66 year old female with basal ganglia hemorrhage, likely hypertensive in etiology. CT shows Hyperdense 2.7 cm area of hemorrhage in the posterior left lentiform nuclei with surrounding edema.      SLP Plan  Continue with current plan of care       Recommendations  Diet recommendations: Dysphagia 2 (fine chop);Thin liquid Liquids provided via: Cup Supervision: Patient able to self feed Compensations: Slow rate;Small sips/bites;Use straw to facilitate chin tuck;Chin tuck Postural Changes and/or Swallow Maneuvers: Seated upright 90 degrees                General recommendations: Rehab consult Oral Care Recommendations: Oral care BID Follow up Recommendations: Inpatient Rehab SLP Visit Diagnosis: Dysphagia, oropharyngeal phase (R13.12) Plan: Continue with current plan of care       GO               [facility] Center At Providence ParkBonnie Shaneya Taketa, MA CCC-SLP [phone]  Cindy Thornton, Cindy Thornton 09/04/2017, 2:09 PM

## 2017-09-04 NOTE — Progress Notes (Signed)
Physical Therapy Treatment Patient Details Name: Cindy PewMaria Parra Soto MRN: [id number] DOB: 18-Jan-1951 Today's Date: 09/04/2017    History of Present Illness [age] year old female with basal ganglia hemorrhage, likely hypertensive in etiology. CT shows Hyperdense 2.7 cm area of hemorrhage in the posterior left lentiform nuclei with surrounding edema.     PT Comments    Pt seen for mobility progression and increased tolerance to therapeutic intervention. Pt continues to require physical assistance of two for transfers with R LE blocking at knee. PT will continue to follow acutely for mobility progression as tolerated.     Follow Up Recommendations  CIR     Equipment Recommendations  None recommended by PT    Recommendations for Other Services Rehab consult     Precautions / Restrictions Precautions Precautions: Fall Precaution Comments: spanish speaking only Restrictions Weight Bearing Restrictions: No    Mobility  Bed Mobility Overal bed mobility: Needs Assistance Bed Mobility: Supine to Sit;Sit to Supine     Supine to sit: Min assist Sit to supine: Max assist;+2 for physical assistance   General bed mobility comments: increased time and effort, assist to elevate trunk and assist of two to safely return to supine  Transfers Overall transfer level: Needs assistance Equipment used: 2 person hand held assist Transfers: Sit to/from Stand Sit to Stand: Mod assist;+2 physical assistance         General transfer comment: pt able to power up, R LE with noted instability and knee buckling requiring therapist to block knee  Ambulation/Gait                 Stairs            Wheelchair Mobility    Modified Rankin (Stroke Patients Only)       Balance Overall balance assessment: Needs assistance Sitting-balance support: No upper extremity supported Sitting balance-Leahy Scale: Fair     Standing balance support: Bilateral upper extremity supported;During  functional activity Standing balance-Leahy Scale: Zero Standing balance comment: Reliant on physical assistance for standing                            Cognition Arousal/Alertness: Awake/alert Behavior During Therapy: Flat affect Overall Cognitive Status: Impaired/Different from baseline Area of Impairment: Following commands;Safety/judgement;Awareness;Problem solving;Attention                       Following Commands: Follows one step commands with increased time;Follows one step commands inconsistently Safety/Judgement: Decreased awareness of safety;Decreased awareness of deficits Awareness: Intellectual Problem Solving: Slow processing;Requires verbal cues;Requires tactile cues        Exercises      General Comments        Pertinent Vitals/Pain Pain Assessment: No/denies pain    Home Living                      Prior Function            PT Goals (current goals can now be found in the care plan section) Acute Rehab PT Goals PT Goal Formulation: With patient/family Time For Goal Achievement: 09/10/17 Potential to Achieve Goals: Good Progress towards PT goals: Progressing toward goals    Frequency    Min 4X/week      PT Plan Current plan remains appropriate    Co-evaluation              AM-PAC PT "6 Clicks" Daily Activity  Outcome Measure  Difficulty turning over in bed (including adjusting bedclothes, sheets and blankets)?: Unable Difficulty moving from lying on back to sitting on the side of the bed? : Unable Difficulty sitting down on and standing up from a chair with arms (e.g., wheelchair, bedside commode, etc,.)?: Unable Help needed moving to and from a bed to chair (including a wheelchair)?: Total Help needed walking in hospital room?: Total Help needed climbing 3-5 steps with a railing? : Total 6 Click Score: 6    End of Session Equipment Utilized During Treatment: Gait belt Activity Tolerance: Patient  tolerated treatment well Patient left: in bed;with call bell/phone within reach Nurse Communication: Mobility status PT Visit Diagnosis: Unsteadiness on feet (R26.81);Hemiplegia and hemiparesis Hemiplegia - Right/Left: Right Hemiplegia - dominant/non-dominant: Dominant Hemiplegia - caused by: Cerebral infarction     Time: [id number] PT Time Calculation (min) (ACUTE ONLY): 23 min  Charges:  $Therapeutic Activity: 23-37 mins                    G Codes:       AdvanceJennifer Trey Bebee, PT, TennesseeDPT [phone]    Alessandra BevelsJennifer M Makenli Derstine 09/04/2017, 4:40 PM

## 2017-09-04 NOTE — Progress Notes (Addendum)
Pt admitted to the unit as a transfer from 4N ICU. Pt arrived to the unit via bed with family at bedside. Pt alert and verbally responsive; IV intact and transfusing. Pt and family oriented to the unit and room; call light within reach; fall/safety precaution/prevention education completed and all voices understanding. Telemetry applied and verified with CCMD and NT called to second verify. Pt bed alarm on; family remains at bedside and will closely monitor. Dionne BucyP. Amo Nyella Eckels RN

## 2017-09-04 NOTE — Progress Notes (Signed)
STROKE TEAM PROGRESS NOTE  Admission history; Cindy Thornton is a 66 y.o. female with a history of hypertension who presents with right-sided weakness that started earlier today.  She was last known well around 1 PM.  Her niece left to go to an event and then around 3:30 PM, she talked to her son on the phone, but he could tell something was not right.  He then drove and found her on the floor, not responding very much.  EMS was therefore called and transported her as a code stroke.  LKW: Noon tpa given?: no, ICH ICH Score: 0  SUBJECTIVE (INTERVAL HISTORY) Son is at the bedside. Patient is found laying in bed in NAD  Overall he feels her condition is stable.  He voices no new concerns. No new events reported overnight. Her blood pressure has been adequately controlled and home medications will be restarted today. MRI Head pending. Planning transfer to CIR in AM  OBJECTIVE Lab Results: CBC:  Recent Labs  Lab 09/02/17 1811 09/02/17 1819 09/04/17 0253  WBC 14.2*  --  9.3  HGB 12.7 13.3 13.2  HCT 38.4 39.0 39.1  MCV 86.9  --  85.4  PLT 180  --  199   BMP: Recent Labs  Lab 09/02/17 1811 09/02/17 1819 09/04/17 0253  NA 138 140 140  K 4.0 4.0 3.4*  CL 104 104 105  CO2 23  --  26  GLUCOSE 203* 212* 124*  BUN 19 20 16   CREATININE 0.92 0.80 0.78  CALCIUM 9.0  --  9.3  MG  --   --  2.0  PHOS  --   --  4.6   Liver Function Tests:  Recent Labs  Lab 09/02/17 1811  AST 25  ALT 20  ALKPHOS 78  BILITOT <0.1*  PROT 7.0  ALBUMIN 3.4*   Coagulation Studies:  Recent Labs    09/02/17 1811  APTT 24  INR 0.93   Urinalysis:  Recent Labs  Lab 09/03/17 0808  COLORURINE YELLOW  APPEARANCEUR CLEAR  LABSPEC 1.012  PHURINE 5.0  GLUCOSEU NEGATIVE  HGBUR SMALL*  BILIRUBINUR NEGATIVE  KETONESUR NEGATIVE  PROTEINUR 30*  NITRITE NEGATIVE  LEUKOCYTESUR NEGATIVE   Urine Drug Screen:     Component Value Date/Time   LABOPIA NONE DETECTED 09/03/2017 0808   COCAINSCRNUR NONE  DETECTED 09/03/2017 0808   LABBENZ NONE DETECTED 09/03/2017 0808   AMPHETMU NONE DETECTED 09/03/2017 0808   THCU NONE DETECTED 09/03/2017 0808   LABBARB NONE DETECTED 09/03/2017 0808    Alcohol Level:  Recent Labs  Lab 09/02/17 1811  ETH <10   PHYSICAL EXAM Temp:  [98.4 F (36.9 C)-99.2 F (37.3 C)] 98.8 F (37.1 C) (12/13 1200) Pulse Rate:  [80-111] 86 (12/13 1130) Resp:  [12-26] 22 (12/13 1130) BP: (111-169)/(48-111) 145/70 (12/13 1130) SpO2:  [92 %-99 %] 98 % (12/13 1130) General - Well nourished, well developed, in no apparent distress Respiratory - Lungs clear bilaterally. No wheezing. Cardiovascular - Regular rate and rhythm  Neuro: Mental Status: Patient is awake, alert, she is able to tell me her name, but does not answer the month or her age, She is severely dysarthric. Cranial Nerves: II: Visual Fields are full. Pupils are equal, round, and reactive to light.   III,IV, VI: She crosses midline readily, but does appear to have a slight left gaze preference. V: Facial sensation is diminished on the right VII: Facial movement is notable for right facial droop VIII: hearing is intact to voice  XII: tongue deviates to the right Motor: She has a severe right flaccid hemiparesis, 1/5 in the arm and leg. Purposeful antigravity movements on the left. Sensory: Sensation is severely reduced on the right Cerebellar: No clear ataxia when moving her left side  IMAGING: I have personally reviewed the radiological images below and agree with the radiology interpretations.  Ct Head Code Stroke Wo Contrast Addendum Date: 09/02/2017   IMPRESSION: 1. Hyperdense 2.7 cm area of hemorrhage in the posterior left lentiform nuclei with surrounding edema. Mild regional mass effect with no midline shift. No intraventricular or extra-axial extension of hemorrhage. 2. Advanced bilateral cerebral white matter disease and small area of chronic cortical encephalomalacia in the posterior left MCA  territory. 3. ASPECTS is not applicable, acute hemorrhage.  Echocardiogram:  Study Conclusions - Left ventricle: The cavity size was normal. Wall thickness was   increased in a pattern of moderate LVH. Systolic function was   normal. The estimated ejection fraction was in the range of 60%   to 65%. Wall motion was normal; there were no regional wall   motion abnormalities. Doppler parameters are consistent with   abnormal left ventricular relaxation (grade 1 diastolic   dysfunction). The E/e&' ratio is between 8-15, suggesting   indeterminate LV filling pressure. - Aortic valve: Mildly calcified leaflets. Mild stenosis. There was   no regurgitation. Mean gradient (S): 12 mm Hg. Peak gradient (S):   22 mm Hg. Valve area (VTI): 1.64 cm^2. Valve area (Vmax): 1.63   cm^2. Valve area (Vmean): 1.74 cm^2. - Mitral valve: Mildly thickened leaflets . There was trivial   regurgitation. - Left atrium: The atrium was normal in size. - Tricuspid valve: There was mild regurgitation. - Pulmonary arteries: PA peak pressure: 49 mm Hg (S). - Inferior vena cava: The vessel was normal in size. The   respirophasic diameter changes were in the normal range (>= 50%),   consistent with normal central venous pressure. Impressions: - LVEF 60-65%, moderate LVH, normal wall motion, grade 1 DD,   indeterminate LV filling pressure, mild aortic stenosis - mean   gradient 12 mmHg, AVA of 1.7 cm2, normal biatrial size, mild TR,   RVSP 49 mmHg, normal IVC.  CTA Head/Neck:                                                No evidence of increased bleeding in the left basal ganglia. No evidence of increased mass effect.Aortic atherosclerosis. 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 Both carotid bifurcations are widely patent.No posterior circulation compromise MRI Head:                                                           PENDING   _____________________________________________________________________ ASSESSMENT: Ms. Luz Mares is a 66 y.o. female with PMH of HTN, HLD, DM admitted with complaints of right-sided weakness.  CT reveals basal ganglia hemorrhage possibly hypertensive in etiology. Due to the location, she is not a surgical candidate at this time.  She is being admitted to the intensive care unit for aggressive blood pressure management and close observation.  Left  posterior lentiform nuclei -2.7 cm area of hemorrhage w/surrounding edema. Mild regional mass effect, no midline shift.   Suspected Etiology: unknown. Possibly hypertensive in etiology.   Resultant Symptoms: Right-sided weakness Stroke Risk Factors: diabetes mellitus, hyperlipidemia and hypertension Other Stroke Risk Factors: Advanced age,  Outstanding Stroke Work-up Studies: MRI Head:                                                          PENDING    09/03/17: Neuro exam remained stable.  Patient continues to have right-sided weakness.  Modified barium swallow in progress.  Case management consulted for insurance needs. Long discussion with son at bedside regarding plan of care and long-term prognosis  She has presented with right hemiplegia due to left subcortical hemorrhage etiology likely hypertensive though her blood pressure has not been significantly elevated. She remains at risk for neurological worsening and hematoma expansion. Recommend close neurological monitoring and tight blood pressure control. Check CT angiogram of the brain. Long discussion with the bedside with the patient's son and answered questions.   09/04/17: Patient awake and more interactive with examiner today.  Neuro exam remained stable.  Continues to have right-sided weakness.  Passed a modified barium swallow.  CIR has been consulted and pending transfer tomorrow.  Echocardiogram performed yesterday and within normal limits.  MRI pending.  Home blood  pressure medications restarted today and Cardene drip discontinued. Past speech swallow eval and now on dysphagia 2 diet thin liquids Will transfer to 3 W. today  PLAN  09/04/2017: Transfer to 3W today and hopefully CIR in AM MRI Head Pending HOLD ASA for now Continue Atorvastatin Consult case management for insurance and placement needs Ongoing aggressive stroke risk factor management Patient counseled to be compliant with her antithrombotic medications Follow up with [facility] in 6 weeks  DYSPHAGIA: Passed - Modified barium swallow 12/12 On dysphagia 2 diet with thin liquids Continue Aspiration  Precautions  Leukocytosis Likely reactive, WBC 9.3 and remains afebrile at 98.8 today 12/13 UA negative Chest x-ray 12/12 - Right middle lobe atelectasis vs pneumonia No clinical signs of Pneumonia Incentive Spirometry every 1/hr while awake Will advise CIR to repeat CXR as recommended in 3 weeks Repeat labs in AM  HYPERTENSION: Stable,some elevated B/P's  SBP goal less than 180/90  Long term BP goal normotensive. Restarted home B/P medications today 12/13 Home Meds: Metoprolol, lisinopril-HCTZ  HYPERLIPIDEMIA:    Component Value Date/Time   CHOL 114 09/03/2017 0804   TRIG 113 09/03/2017 0804   HDL 44 09/03/2017 0804   CHOLHDL 2.6 09/03/2017 0804   VLDL 23 09/03/2017 0804   LDLCALC 47 09/03/2017 0804  Home Meds: Lipitor 20 LDL  goal < 70 Continued on Lipitor to 10 mg daily Continue statin at discharge  DIABETES: Lab Results  Component Value Date   HGBA1C 7.1 (H) 09/03/2017  HgbA1c goal < 7.0 Currently on: NovoLog Continue CBG monitoring and SSI DM education   Other Active Problems: Active Problems:   Stroke (cerebrum) (HCC)   Leukocytosis   Dysphagia, post-stroke   Benign essential HTN   Diabetes mellitus type 2 in obese [facility](HCC)  Hospital day # 2  VTE prophylaxis: SCD's  Diet : DIET - DYS 1 Room service appropriate? Yes; Fluid consistency:  Nectar Thick   Prior Home Stroke  Medications: No antithrombotic  Discharge Stroke Meds: Please discharge patient on TBD   Disposition: Final discharge disposition not confirmed Therapy Recs:   Likely CIR Follow Recs:  Follow-up Information    Micki RileySethi, Soha Thorup S, MD. Schedule an appointment as soon as possible for a visit in 6 week(s).   Specialties:  Neurology, Radiology Contact information: [address] [place] [address] [phone]          No primary care provider on file.-Case management aware.  FAMILY UPDATES: Son at bedside  TEAM UPDATES: Micki RileySethi, Jaycie Kregel S, MD  Brita RompMary A Costello, ANP-C Stroke Neurology Team 09/04/2017 12:29 PM I have personally examined this patient, reviewed notes, independently viewed imaging studies, participated in medical decision making and plan of care.ROS completed by me personally and pertinent positives fully documented  I have made any additions or clarifications directly to the above note. Agree with note above.  Agree with transfer patient to floor bed. Mobilize out of bed as tolerated. Therapy consults and transfer to rehabilitation over the next few days. Greater than 50% time during this 25 minute visit was spent on counseling and coordination of care about her intracerebral hemorrhage and plan of care and answering questions. Spoke to patient's son at the bedside and neice over the phone Delia HeadyPramod Yovana Scogin, MD Medical Director [facility] Pager: [phone] 09/04/2017 2:19 PM  To contact Stroke Continuity provider, please refer to [email or web address]. After hours, contact General Neurology

## 2017-09-04 NOTE — Progress Notes (Signed)
Inpatient Rehabilitation  Attempted to meet with patient and family to discuss team's recommendation for IP Rehab; however, upon my arrival the ladies at bedside informed me that they were visitors from church and that patient's son had just stepped out.  They requested I follow up later; will re-attempt as able.  Call if questions.   Charlane FerrettiMelissa Nneka Blanda, M.A., CCC/SLP Admission Coordinator  [facility]  Cell [phone]

## 2017-09-04 NOTE — Progress Notes (Signed)
Called MRI for patient's MRI follow up. MRI busy at this time. Will notify RN when patient can come down.

## 2017-09-05 LAB — BASIC METABOLIC PANEL
Anion gap: 8 (ref 5–15)
BUN: 16 mg/dL (ref 6–20)
CHLORIDE: 105 mmol/L (ref 101–111)
CO2: 26 mmol/L (ref 22–32)
Calcium: 9.3 mg/dL (ref 8.9–10.3)
Creatinine, Ser: 0.77 mg/dL (ref 0.44–1.00)
GFR calc Af Amer: >60 mL/min (ref >60)
GFR calc non Af Amer: >60 mL/min (ref >60)
Glucose, Bld: 124 mg/dL — ABNORMAL HIGH (ref 65–99)
POTASSIUM: 4 mmol/L (ref 3.5–5.1)
SODIUM: 139 mmol/L (ref 135–145)

## 2017-09-05 LAB — CBC
HEMATOCRIT: 39.1 % (ref 36.0–46.0)
HEMOGLOBIN: 12.9 g/dL (ref 12.0–15.0)
MCH: 28.4 pg (ref 26.0–34.0)
MCHC: 33 g/dL (ref 30.0–36.0)
MCV: 86.1 fL (ref 78.0–100.0)
Platelets: 196 10*3/uL (ref 150–400)
RBC: 4.54 MIL/uL (ref 3.87–5.11)
RDW: 13.5 % (ref 11.5–15.5)
WBC: 9.4 10*3/uL (ref 4.0–10.5)

## 2017-09-05 LAB — GLUCOSE, CAPILLARY
GLUCOSE-CAPILLARY: 103 mg/dL — AB (ref 65–99)
GLUCOSE-CAPILLARY: 123 mg/dL — AB (ref 65–99)
GLUCOSE-CAPILLARY: 175 mg/dL — AB (ref 65–99)
GLUCOSE-CAPILLARY: 189 mg/dL — AB (ref 65–99)
GLUCOSE-CAPILLARY: 78 mg/dL (ref 65–99)
Glucose-Capillary: 117 mg/dL — ABNORMAL HIGH (ref 65–99)
Glucose-Capillary: 207 mg/dL — ABNORMAL HIGH (ref 65–99)

## 2017-09-05 MED ORDER — LISINOPRIL 20 MG PO TABS
20.0000 mg | ORAL_TABLET | Freq: Every day | ORAL | Status: DC
  Administered 2017-09-05 – 2017-09-06 (×2): 20 mg via ORAL
  Filled 2017-09-05 (×2): qty 1

## 2017-09-05 NOTE — Progress Notes (Signed)
STROKE TEAM PROGRESS NOTE  Admission history; Cindy Thornton is a 66 y.o. female with a history of hypertension who presents with right-sided weakness that started earlier today.  She was last known well around 1 PM.  Her niece left to go to an event and then around 3:30 PM, she talked to her son on the phone, but he could tell something was not right.  He then drove and found her on the floor, not responding very much.  EMS was therefore called and transported her as a code stroke.  LKW: Noon tpa given?: no, ICH ICH Score: 0  SUBJECTIVE (INTERVAL HISTORY) Son is at the bedside. Patient is found laying in bed in NAD  Overall he feels her condition is stable.  He voices no new concerns. No new events reported overnight.  Planning transfer to CIR in AM  OBJECTIVE Lab Results: CBC:  Recent Labs  Lab 09/02/17 1811 09/02/17 1819 09/04/17 0253 09/05/17 0408  WBC 14.2*  --  9.3 9.4  HGB 12.7 13.3 13.2 12.9  HCT 38.4 39.0 39.1 39.1  MCV 86.9  --  85.4 86.1  PLT 180  --  199 196   BMP: Recent Labs  Lab 09/02/17 1811 09/02/17 1819 09/04/17 0253 09/05/17 0408  NA 138 140 140 139  K 4.0 4.0 3.4* 4.0  CL 104 104 105 105  CO2 23  --  26 26  GLUCOSE 203* 212* 124* 124*  BUN 19 20 16 16   CREATININE 0.92 0.80 0.78 0.77  CALCIUM 9.0  --  9.3 9.3  MG  --   --  2.0  --   PHOS  --   --  4.6  --    Liver Function Tests:  Recent Labs  Lab 09/02/17 1811  AST 25  ALT 20  ALKPHOS 78  BILITOT <0.1*  PROT 7.0  ALBUMIN 3.4*   Coagulation Studies:  Recent Labs    09/02/17 1811  APTT 24  INR 0.93   Urinalysis:  Recent Labs  Lab 09/03/17 0808  COLORURINE YELLOW  APPEARANCEUR CLEAR  LABSPEC 1.012  PHURINE 5.0  GLUCOSEU NEGATIVE  HGBUR SMALL*  BILIRUBINUR NEGATIVE  KETONESUR NEGATIVE  PROTEINUR 30*  NITRITE NEGATIVE  LEUKOCYTESUR NEGATIVE   Urine Drug Screen:     Component Value Date/Time   LABOPIA NONE DETECTED 09/03/2017 0808   COCAINSCRNUR NONE DETECTED  09/03/2017 0808   LABBENZ NONE DETECTED 09/03/2017 0808   AMPHETMU NONE DETECTED 09/03/2017 0808   THCU NONE DETECTED 09/03/2017 0808   LABBARB NONE DETECTED 09/03/2017 0808    Alcohol Level:  Recent Labs  Lab 09/02/17 1811  ETH <10   PHYSICAL EXAM Temp:  [97.5 F (36.4 C)-98.9 F (37.2 C)] 98.6 F (37 C) (12/14 1039) Pulse Rate:  [74-88] 76 (12/14 1039) Resp:  [14-19] 18 (12/14 1039) BP: (150-179)/(43-94) 150/43 (12/14 1039) SpO2:  [97 %-99 %] 99 % (12/14 1039) General - Well nourished, well developed, in no apparent distress Respiratory - Lungs clear bilaterally. No wheezing. Cardiovascular - Regular rate and rhythm  Neuro: Mental Status: Patient is awake, alert, she is able to tell me her name, but does not answer the month or her age, She is severely dysarthric. Cranial Nerves: II: Visual Fields are full. Pupils are equal, round, and reactive to light.   III,IV, VI: She crosses midline readily, but does appear to have a slight left gaze preference. V: Facial sensation is diminished on the right VII: Facial movement is notable for right facial  droop VIII: hearing is intact to voice XII: tongue deviates to the right Motor: She has a severe right flaccid hemiparesis, 1/5 in the arm and leg. Purposeful antigravity movements on the left. Sensory: Sensation is severely reduced on the right Cerebellar: No clear ataxia when moving her left side  IMAGING: I have personally reviewed the radiological images below and agree with the radiology interpretations.  Ct Head Code Stroke Wo Contrast Addendum Date: 09/02/2017   IMPRESSION: 1. Hyperdense 2.7 cm area of hemorrhage in the posterior left lentiform nuclei with surrounding edema. Mild regional mass effect with no midline shift. No intraventricular or extra-axial extension of hemorrhage. 2. Advanced bilateral cerebral white matter disease and small area of chronic cortical encephalomalacia in the posterior left MCA territory.  3. ASPECTS is not applicable, acute hemorrhage.  Echocardiogram:  Study Conclusions - Left ventricle: The cavity size was normal. Wall thickness was   increased in a pattern of moderate LVH. Systolic function was   normal. The estimated ejection fraction was in the range of 60%   to 65%. Wall motion was normal; there were no regional wall   motion abnormalities. Doppler parameters are consistent with   abnormal left ventricular relaxation (grade 1 diastolic   dysfunction). The E/e&' ratio is between 8-15, suggesting   indeterminate LV filling pressure. - Aortic valve: Mildly calcified leaflets. Mild stenosis. There was   no regurgitation. Mean gradient (S): 12 mm Hg. Peak gradient (S):   22 mm Hg. Valve area (VTI): 1.64 cm^2. Valve area (Vmax): 1.63   cm^2. Valve area (Vmean): 1.74 cm^2. - Mitral valve: Mildly thickened leaflets . There was trivial   regurgitation. - Left atrium: The atrium was normal in size. - Tricuspid valve: There was mild regurgitation. - Pulmonary arteries: PA peak pressure: 49 mm Hg (S). - Inferior vena cava: The vessel was normal in size. The   respirophasic diameter changes were in the normal range (>= 50%),   consistent with normal central venous pressure. Impressions: - LVEF 60-65%, moderate LVH, normal wall motion, grade 1 DD,   indeterminate LV filling pressure, mild aortic stenosis - mean   gradient 12 mmHg, AVA of 1.7 cm2, normal biatrial size, mild TR,   RVSP 49 mmHg, normal IVC.  CTA Head/Neck:  No evidence of increased bleeding in the left basal ganglia. No evidence of increased mass effect.Aortic atherosclerosis. 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 Both carotid bifurcations are widely patent.No posterior circulation compromise  MRI Head:                                                           IMPRESSION: 1. Hemorrhagic left basal ganglia  infarct. 2. Advanced chronic small vessel ischemic disease. 3. Chronic left parietal infarct.   _____________________________________________________________________ ASSESSMENT: Ms. Cindy Thornton is a 66 y.o. female with PMH of HTN, HLD, DM admitted with complaints of right-sided weakness.  CT reveals basal ganglia hemorrhage possibly hypertensive in etiology. Due to the location, she is not a surgical candidate at this time.  She is being admitted to the intensive care unit for aggressive blood pressure management and close observation.  Left posterior lentiform nuclei -2.7 cm area of hemorrhage w/surrounding edema. Mild regional mass effect, no midline shift.  Suspected Etiology: unknown. Possibly hypertensive in etiology.   Resultant Symptoms: Right-sided weakness Stroke Risk Factors: diabetes mellitus, hyperlipidemia and hypertension Other Stroke Risk Factors: Advanced age,  Outstanding Stroke Work-up Studies:     Workup completed                                             09/03/17: Neuro exam remained stable.  Patient continues to have right-sided weakness.  Modified barium swallow in progress.  Case management consulted for insurance needs. Long discussion with son at bedside regarding plan of care and long-term prognosis  She has presented with right hemiplegia due to left subcortical hemorrhage etiology likely hypertensive though her blood pressure has not been significantly elevated. She remains at risk for neurological worsening and hematoma expansion. Recommend close neurological monitoring and tight blood pressure control. Check CT angiogram of the brain. Long discussion with the bedside with the patient's son and answered questions.   09/04/17: Patient awake and more interactive with examiner today.  Neuro exam remained stable.  Continues to have right-sided weakness.  Passed a modified barium swallow.  CIR has been consulted and pending transfer tomorrow.  Echocardiogram  performed yesterday and within normal limits.  MRI pending.  Home blood pressure medications restarted today and Cardene drip discontinued. Past speech swallow eval and now on dysphagia 2 diet thin liquids Will transfer to 3 W today  Agree with transfer patient to floor bed. Mobilize out of bed as tolerated. Therapy consults and transfer to rehabilitation over the next few days.  09/05/17: Neuro exam remained stable.  MRI shows stable hemorrhage in the left basal ganglia with no edema or midline shift.  Patient awake and following commands.  Continues to have moderate expressive aphasia.  Plan of care reviewed with son at bedside.  CIR meeting with family today, will transfer in AM.  PLAN  09/05/2017: CIR admission in AM HOLD ASA for now Continue Atorvastatin Consult case management for insurance and placement needs Ongoing aggressive stroke risk factor management Patient counseled to be compliant with her antithrombotic medications Follow up with [facility] in 6 weeks  DYSPHAGIA: Passed - Modified barium swallow 12/12 On dysphagia 2 diet with thin liquids Continue Aspiration  Precautions  Leukocytosis- Resolved Likely reactive, WBC 9.4 and remains afebrile at 98.6 today 12/14 UA negative Chest x-ray 12/12 - Right middle lobe atelectasis vs pneumonia No clinical signs of Pneumonia Incentive Spirometry every 1/hr while awake Will advise CIR to repeat CXR as recommended in 3 weeks  HYPERTENSION: Stable,some elevated B/P's  SBP goal less than 180/90  Long term BP goal normotensive. Restarted home B/P medications today 12/13, increased Lisinopril dose 12/14 Home Meds: Metoprolol, lisinopril-HCTZ  HYPERLIPIDEMIA:    Component Value Date/Time   CHOL 114 09/03/2017 0804   TRIG 113 09/03/2017 0804   HDL 44 09/03/2017 0804   CHOLHDL 2.6 09/03/2017 0804   VLDL 23 09/03/2017 0804   LDLCALC 47 09/03/2017 0804  Home Meds: Lipitor 20 LDL  goal < 70 Continued on  Lipitor to 10 mg daily Continue statin at discharge  DIABETES: Lab Results  Component Value Date   HGBA1C 7.1 (H) 09/03/2017  HgbA1c goal < 7.0 Currently on: NovoLog Continue CBG monitoring and SSI DM education   Other Active Problems: Active Problems:   Stroke (cerebrum) (HCC)   Leukocytosis   Dysphagia, post-stroke   Benign  essential HTN   Diabetes mellitus type 2 in obese [facility](HCC)  Hospital day # 3  VTE prophylaxis: SCD's  Diet : DIET DYS 2 Room service appropriate? Yes; Fluid consistency: Nectar Thick   Prior Home Stroke Medications: No antithrombotic  Discharge Stroke Meds:  Please discharge patient on ASA 81 mg daily and Lipitor 10 mg daily  Disposition: Final discharge disposition not confirmed Therapy Recs:   Likely CIR Follow Recs:  Follow-up Information    Micki RileySethi, Hrithik Boschee S, MD. Schedule an appointment as soon as possible for a visit in 6 week(s).   Specialties:  Neurology, Radiology Contact information: [address] [place] [address] [phone]          No primary care provider on file.-Case management aware.  FAMILY UPDATES: Son at bedside  TEAM UPDATES: Micki RileySethi, Kele Barthelemy S, MD  Brita RompMary A Costello, ANP-C Stroke Neurology Team 09/05/2017 12:41 PM I have personally examined this patient, reviewed notes, independently viewed imaging studies, participated in medical decision making and plan of care.ROS completed by me personally and pertinent positives fully documented  I have made any additions or clarifications directly to the above note. Agree with note above.  Discussed with patient and son and answered questions.  Await transfer to rehab when bed available.  Greater than 50% time during this 25-minute visit was spent on counseling and coordination of care about her intracerebral hemorrhage and discussion about plan for disposition  Delia HeadyPramod Alvan Culpepper, MD Medical Director [facility] Pager: [phone] 09/05/2017 6:11 PM  To  contact Stroke Continuity provider, please refer to [email or web address]. After hours, contact General Neurology

## 2017-09-05 NOTE — Progress Notes (Signed)
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 CT shows Hyperdense 2.7 cm area of hemorrhage in the posterior left lentiform nuclei with surrounding edema.     PT Comments    Session focused on bed mobility and sit to stand transfers. Pt demonstrating increased participation in supine to sit now able to move RLE off bed with min A. Patient requiring assistance with standing into stedy, with notable hip external rotation and ankle eversion on right side. Patient scheduled to d/c to CIR tomorrow and will do very well.    Follow Up Recommendations  CIR     Equipment Recommendations  None recommended by PT    Recommendations for Other Services       Precautions / Restrictions Precautions Precautions: Fall Precaution Comments: spanish speaking only Restrictions Weight Bearing Restrictions: No    Mobility  Bed Mobility Overal bed mobility: Needs Assistance Bed Mobility: Supine to Sit     Supine to sit: Min assist     General bed mobility comments: increased time and effort, assist with RLE and trunk with min A level support. able to assist in scooting to EOB with UE support.   Transfers Overall transfer level: Needs assistance Equipment used: 2 person hand held assist Transfers: Sit to/from Stand Sit to Stand: Mod assist;+2 physical assistance         General transfer comment: sit to stand x2 with mod A and blocking of RLE at knee due to instabilty and R sided weakness.   Ambulation/Gait                 Stairs            Wheelchair Mobility    Modified Rankin (Stroke Patients Only)       Balance Overall balance assessment: Needs assistance Sitting-balance support: No upper extremity supported Sitting balance-Leahy Scale: Fair     Standing balance support:  Bilateral upper extremity supported;During functional activity Standing balance-Leahy Scale: Zero Standing balance comment: Reliant on physical assistance for standing                            Cognition Arousal/Alertness: Awake/alert Behavior During Therapy: Flat affect Overall Cognitive Status: Impaired/Different from baseline Area of Impairment: Following commands;Safety/judgement;Awareness;Problem solving;Attention                   Current Attention Level: Sustained   Following Commands: Follows one step commands with increased time;Follows one step commands inconsistently Safety/Judgement: Decreased awareness of safety;Decreased awareness of deficits   Problem Solving: Slow processing;Requires verbal cues;Requires tactile cues General Comments: Pt with slow processing and requires increased time and cues for follow commands. Pt motivated to participate       Exercises      General Comments General comments (skin integrity, edema, etc.): Pt family and translator present throughout session.       Pertinent Vitals/Pain Pain Assessment: No/denies pain    Home Living                      Prior Function            PT Goals (current goals can now be found in the care plan section) Acute Rehab PT Goals Patient Stated Goal: didn't state PT Goal Formulation: With patient/family Time For Goal Achievement:  09/10/17 Potential to Achieve Goals: Good Progress towards PT goals: Progressing toward goals    Frequency    Min 4X/week      PT Plan Current plan remains appropriate    Co-evaluation              AM-PAC PT "6 Clicks" Daily Activity  Outcome Measure  Difficulty turning over in bed (including adjusting bedclothes, sheets and blankets)?: Unable Difficulty moving from lying on back to sitting on the side of the bed? : Unable Difficulty sitting down on and standing up from a chair with arms (e.g., wheelchair, bedside commode,  etc,.)?: Unable Help needed moving to and from a bed to chair (including a wheelchair)?: Total Help needed walking in hospital room?: Total Help needed climbing 3-5 steps with a railing? : Total 6 Click Score: 6    End of Session Equipment Utilized During Treatment: Gait belt Activity Tolerance: Patient tolerated treatment well Patient left: with call bell/phone within reach;in chair;with family/visitor present Nurse Communication: Mobility status PT Visit Diagnosis: Unsteadiness on feet (R26.81);Hemiplegia and hemiparesis Hemiplegia - Right/Left: Right Hemiplegia - dominant/non-dominant: Dominant Hemiplegia - caused by: Cerebral infarction     Time: 1610-1700(time spent with translation and vistors and pericare) PT Time Calculation (min) (ACUTE ONLY): 50 min  Charges:  $Therapeutic Activity: 8-22 mins $Neuromuscular Re-education: 8-22 mins                    G Codes:       Etta GrandchildSean Ranette Luckadoo, PT, DPT Acute Rehab Services Pager: [phone]     Etta GrandchildSean  Evonne Rinks 09/05/2017, 6:16 PM

## 2017-09-05 NOTE — Progress Notes (Signed)
Inpatient Rehabilitation  I have called Spanish interpreter, Ashby DawesGraciela who is available today as 12.  I will plan to meet with Ashby DawesGraciela, patient, and family to discuss potential IP Rehab admission.  Will update the team after.    Charlane FerrettiMelissa Hitomi Slape, M.A., CCC/SLP Admission Coordinator  [facility]  Cell [phone]

## 2017-09-05 NOTE — Care Management Note (Signed)
Case Management Note  Patient Details  Name: Cindy Thornton MRN: [id number] Date of Birth: 02-09-1951  Subjective/Objective:   Pt admitted with CVA. She is from home with her granddaughter. She speaks [place].                 Action/Plan: Plan is for CIR. CM following.  Expected Discharge Date:                  Expected Discharge Plan:  IP Rehab Facility  In-House Referral:     Discharge planning Services     Post Acute Care Choice:    Choice offered to:     DME Arranged:    DME Agency:     HH Arranged:    HH Agency:     Status of Service:  In process, will continue to follow  If discussed at Long Length of Stay Meetings, dates discussed:    Additional Comments:  Kermit BaloKelli F Giavonni Fonder, RN 09/05/2017, 3:33 PM

## 2017-09-05 NOTE — PMR Pre-admission (Signed)
PMR Admission Coordinator Pre-Admission Assessment  Patient: Cindy Thornton is an 66 y.o., female MRN: [id number] DOB: 20-May-1951 Height: 5\' 5"  (165.1 cm) Weight: 70.8 kg (156 lb 1.4 oz)              Insurance Information HMO:     PPO:      PCP:      IPA:      80/20:      OTHER:  PRIMARY: Uninsured/Self-pay      Policy#:       Subscriber:  CM Name:       Phone#:      Fax#:  Pre-Cert#:       Employer:  Benefits:  Phone #:      Name:  Eff. Date:      Deduct:       Out of Pocket Max:       Life Max:  CIR:       SNF:  Outpatient:      Co-Pay:  Home Health:       Co-Pay:  DME:      Co-Pay:  Providers:   Medicaid Application Date:       Case Manager:  Disability Application Date:       Case Worker:   Emergency Contact Information Contact Information    Name Relation Home Work Mobile   Dominguez,Longino Son   [phone]   Leone HavenDominguez Para, Antonio Son   [phone]     Current Medical History  Patient Admitting Diagnosis: Left basal ganglia hemorrhage   History of Present Illness: Cindy Thornton a 66 y.o.femalewith history of T2DM, HTN who was admitted on 09/02/17 after being found by family with left sided weakness, right facial droop and difficulty speaking.History taken from chart review and son.CT headreviewed, showing left basal ganglia hemorrhage  with surrounding edema and mild regional mass effect. Dr. Pearlean BrownieSethi felt to be hypertensive in nature and she was started on nicardipine drip. MRI brain done revealing hemorrhage in left basal ganglia infarct, advanced chronic small vessel disease and chronic left parietal infarct. 2D echo showed EF 60-65% with moderate LVH, mild aortic stenosis and trivial MVR. CTA head/neck done today revealing no evidence of increase in bleeding, aortic atherosclerosis and 20% stenosis of proximal L-SA.  MBS done 12/12 and she was placed on dysphagia 1, nectar liquids. Mentation improving but verbal output with gibberish and severe dysarthria.   Patient with resultant right sided weakness with sensory deficits as well as severe dysarthria with minimal breath support--question aphasia. CIR recommended by therapy team due to functional decline and patient admitted to IP Rehab 09/06/17.  NIH Total: 10    Past Medical History  Past Medical History:  Diagnosis Date  . Diabetes mellitus without complication (HCC)   . Hyperlipidemia   . Hypertension     Family History  family history includes Healthy in her sister.  Prior Rehab/Hospitalizations:  Has the patient had major surgery during 100 days prior to admission? No  Current Medications   Current Facility-Administered Medications:  .  acetaminophen (TYLENOL) tablet 650 mg, 650 mg, Oral, Q4H PRN **OR** acetaminophen (TYLENOL) solution 650 mg, 650 mg, Per Tube, Q4H PRN **OR** acetaminophen (TYLENOL) suppository 650 mg, 650 mg, Rectal, Q4H PRN, Rejeana BrockKirkpatrick, McNeill P, MD .  atorvastatin (LIPITOR) tablet 10 mg, 10 mg, Oral, q1800, Costello, Mary A, NP, 10 mg at 09/04/17 1805 .  hydrochlorothiazide (MICROZIDE) capsule 12.5 mg, 12.5 mg, Oral, Daily, Micki RileySethi, Pramod S, MD, 12.5 mg at 09/05/17 0915 .  insulin aspart (novoLOG) injection 2-6 Units, 2-6 Units, Subcutaneous, Q4H, Rejeana Brock, MD, 4 Units at 09/05/17 1332 .  lisinopril (PRINIVIL,ZESTRIL) tablet 20 mg, 20 mg, Oral, Daily, Costello, Mary A, NP, 20 mg at 09/05/17 0928 .  MEDLINE mouth rinse, 15 mL, Mouth Rinse, BID, Micki Riley, MD, 15 mL at 09/04/17 2142 .  metoprolol succinate (TOPROL-XL) 24 hr tablet 50 mg, 50 mg, Oral, Daily, Costello, Mary A, NP, 50 mg at 09/05/17 0915 .  pantoprazole (PROTONIX) EC tablet 40 mg, 40 mg, Oral, Daily, Masters, Darl Householder, RPH, 40 mg at 09/04/17 2119 .  RESOURCE THICKENUP CLEAR, , Oral, PRN, Micki Riley, MD .  senna-docusate (Senokot-S) tablet 1 tablet, 1 tablet, Oral, BID, Rejeana Brock, MD, 1 tablet at 09/05/17 0915  Patients Current Diet: DIET DYS 2 Room service  appropriate? Yes; Fluid consistency: Nectar Thick  Precautions / Restrictions Precautions Precautions: Fall Precaution Comments: spanish speaking only Restrictions Weight Bearing Restrictions: No   Has the patient had 2 or more falls or a fall with injury in the past year?No  Prior Activity Level Community (5-7x/wk): Prior to admission patient lived with family and helped care for grandchildren.  She has a supportive family that are committed to now helping her as needed.  Home Assistive Devices / Equipment Home Assistive Devices/Equipment: None  Prior Device Use: Indicate devices/aids used by the patient prior to current illness, exacerbation or injury? None of the above  Prior Functional Level Prior Function Level of Independence: Independent Comments: ADLs. Does not read. Takes care of a few children in her home. Granddaughter does IADLs. Fatigues quickly with mobility  Self Care: Did the patient need help bathing, dressing, using the toilet or eating? Independent  Indoor Mobility: Did the patient need assistance with walking from room to room (with or without device)? Independent  Stairs: Did the patient need assistance with internal or external stairs (with or without device)? Independent  Functional Cognition: Did the patient need help planning regular tasks such as shopping or remembering to take medications? Independent  Current Functional Level Cognition  Arousal/Alertness: Awake/alert Overall Cognitive Status: Impaired/Different from baseline Current Attention Level: Sustained Orientation Level: Oriented to person, Oriented to place, Disoriented to time, Disoriented to situation Following Commands: Follows one step commands with increased time, Follows one step commands inconsistently Safety/Judgement: Decreased awareness of safety, Decreased awareness of deficits General Comments: Pt with slow processing and requires increased time and cues for follow commands. Pt  motivated to participate  Attention: Sustained, Selective Sustained Attention: Appears intact Selective Attention: Appears intact Memory: Appears intact Problem Solving: Appears intact Safety/Judgment: Appears intact    Extremity Assessment (includes Sensation/Coordination)  Upper Extremity Assessment: Overall WFL for tasks assessed, RUE deficits/detail RUE Deficits / Details: No AROM of RUE. 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 RUE Sensation: decreased light touch RUE Coordination: decreased fine motor, decreased gross motor  Lower Extremity Assessment: Defer to PT evaluation RLE Deficits / Details: pt with initation of LAQ in sitting but no hip or ankle flexion    ADLs  Overall ADL's : Needs assistance/impaired Eating/Feeding: Minimal assistance, Sitting(supported sitting) Eating/Feeding Details (indicate cue type and reason): Pt able to bring spoon to mouth with LUE Grooming: Moderate assistance, Sitting Grooming Details (indicate cue type and reason): Pt able to maintain sitting at EOB with Min Guard A for safety. Pt requiring increased assistance for bilateral activities due to poor funcitonal use of RUE (dominant) with brunnstromm stage 1 Upper Body  Bathing: Moderate assistance, Sitting Lower Body Bathing: Maximal assistance, Sit to/from stand, +2 for physical assistance Upper Body Dressing : Moderate assistance, Sitting Lower Body Dressing: Maximal assistance, Sit to/from stand, +2 for physical assistance Toilet Transfer: Moderate assistance, +2 for physical assistance, Stand-pivot(simulated to recliner) Toilet Transfer Details (indicate cue type and reason): Pt performing stand pivot to reclienr with 2 person hand held assistance and blocking of R knee Functional mobility during ADLs: Moderate assistance, +2 for physical assistance, Cueing for sequencing(2 person hand held assist; stand pivot only) General ADL Comments: Pt demonstrating decreased  fucnitonal performance and decreased balance, cognition, and fucntional use of RUE    Mobility  Overal bed mobility: Needs Assistance Bed Mobility: Supine to Sit, Sit to Supine Supine to sit: Min assist Sit to supine: Max assist, +2 for physical assistance General bed mobility comments: increased time and effort, assist to elevate trunk and assist of two to safely return to supine    Transfers  Overall transfer level: Needs assistance Equipment used: 2 person hand held assist Transfers: Sit to/from Stand Sit to Stand: Mod assist, +2 physical assistance Stand pivot transfers: Mod assist, +2 physical assistance General transfer comment: pt able to power up, R LE with noted instability and knee buckling requiring therapist to block knee    Ambulation / Gait / Stairs / Wheelchair Mobility  Ambulation/Gait General Gait Details: unable at this time    Posture / Balance Dynamic Sitting Balance Sitting balance - Comments: pt able to maintain midline without physical assist and Min guard for safety Balance Overall balance assessment: Needs assistance Sitting-balance support: No upper extremity supported Sitting balance-Leahy Scale: Fair Sitting balance - Comments: pt able to maintain midline without physical assist and Min guard for safety Standing balance support: Bilateral upper extremity supported, During functional activity Standing balance-Leahy Scale: Zero Standing balance comment: Reliant on physical assistance for standing    Special needs/care consideration BiPAP/CPAP: No CPM: No Continuous Drip IV: No Dialysis: No         Life Vest: No Oxygen: No Special Bed: No Trach Size: No Wound Vac (area): No Skin: WDL                               Bowel mgmt: None since admission  Bladder mgmt: Incontinent  Diabetic mgmt: Hemoglobin A1c 7.1     Previous Home Environment Living Arrangements: Other relatives(granddaughter)  Lives With: Family Available Help at Discharge:  Family, Available PRN/intermittently Home Care Services: No Additional Comments: pt lives with granddaughter and is indep with mobiltiy and ADLs, pt watches children in the home  Discharge Living Setting Plans for Discharge Living Setting: Other (Comment)(TBD family meeting this weekend to determine who & where) Does the patient have any problems obtaining your medications?: Yes (Describe)(uninsured/self-pay )  Social/Family/Support Systems Patient Roles: Parent, Other (Comment)(Grandparent ) Contact Information: Antonio Christena Flakeominguez Para, Son Anticipated Caregiver: Family, Antonio stated family would meet this weekend to solidify Anticipated Caregiver's Contact Information: Granddaughter Leavy CellaJasmine speaks English and can coordinate [phone] Caregiver Availability: 24/7(among family members ) Discharge Plan Discussed with Primary Caregiver: Yes Is Caregiver In Agreement with Plan?: Yes Does Caregiver/Family have Issues with Lodging/Transportation while Pt is in Rehab?: No  Goals/Additional Needs Patient/Family Goal for Rehab: PT: Min A; OT: Supervision-Min A; SLP: Supervision  Expected length of stay: 15-20 days  Cultural Considerations: Spanish speaking  Dietary Needs: Dys.2 textures and nectar-thick liquids  Equipment Needs: TBD Special Service Needs: Intrepreter  needed Pt/Family Agrees to Admission and willing to participate: Yes Program Orientation Provided & Reviewed with Pt/Caregiver Including Roles  & Responsibilities: Yes Additional Information Needs: Family meeting this weekend to determine who and where patient will be cared for after IP Rehab, clearly understand needed 24/7 at home is discharge plan  Information Needs to be Provided By: CSW for follow up   Barriers to Discharge: Other (comments)(Clarification of who and where after weekend )  Decrease burden of Care through IP rehab admission: No  Possible need for SNF placement upon discharge: No  Patient Condition: This  patient's medical and functional status has changed since the consult dated: 09/03/17 at 4:05 pm in which the Rehabilitation Physician determined and documented that the patient's condition is appropriate for intensive rehabilitative care in an inpatient rehabilitation facility. See "History of Present Illness" (above) for medical update. Functional changes are: Mod A +2 transfers. Patient's medical and functional status update has been discussed with the Rehabilitation physician and patient remains appropriate for inpatient rehabilitation. Will admit to inpatient rehab tomorrow.  Preadmission Screen Completed By:  Fae Pippin, 09/05/2017 4:00 PM ______________________________________________________________________   Discussed status with Dr. Riley Kill on 09/05/17 at 1605 and received telephone approval for admission tomorrow.  Admission Coordinator:  Fae Pippin, time 1605/Date 09/05/17

## 2017-09-05 NOTE — H&P (Signed)
Physical Medicine and Rehabilitation Admission H&P    Chief Complaint  Patient presents with  . Functional deficits due to aphasia, left sided weakness and dysphagia    HPI:  Cindy Thornton is a 66 y.o. female with history of T2DM, HTN who was admitted on 09/02/17 after being found by family with left sided weakness, right facial droop and difficulty speaking. History taken from chart review and son.  CT head reviewed, showing left basal ganglia hemorrhage  with surrounding edema and mild regional mass effect.  Dr. Leonie Man felt to be hypertensive in nature and she was started on nicardipine drip.  MRI brain done revealing hemorrhage in left basal ganglia infarct, advanced chronic small vessel disease and chronic left parietal infarct. 2D echo showed EF 60-65% with moderate LVH, mild aortic stenosis and trivial  MVR.  CTA head/neck done today revealing no evidence of increase in bleeding, aortic atherosclerosis and 20% stenosis of proximal L-SA.  MBS done 12/12 and she was placed on dysphagia 1, nectar liquids. Mentation improving but verbal output with gibberish and severe dysarthria.  Patient with resultant right sided weakness with sensory deficits as well as severe dysarthria with minimal breath support--question aphasia. CIR recommended by therapy team due to functional decline.    Review of Systems  Unable to perform ROS: Language  Respiratory: Negative for shortness of breath.   Cardiovascular: Negative for chest pain.  Gastrointestinal: Negative for abdominal pain.  Neurological: Positive for speech change and focal weakness. Negative for headaches.      Past Medical History:  Diagnosis Date  . Diabetes mellitus without complication ([place])   . Hyperlipidemia   . Hypertension     History reviewed. No pertinent surgical history.    Family History  Problem Relation Age of Onset  . Healthy Sister     Social History:     Allergies: No Known Allergies    Medications  Prior to Admission  Medication Sig Dispense Refill  . atorvastatin (LIPITOR) 20 MG tablet Take 20 mg by mouth daily.    Marland Kitchen lisinopril-hydrochlorothiazide (PRINZIDE,ZESTORETIC) 10-12.5 MG tablet Take 1 tablet by mouth daily.    . metFORMIN (GLUCOPHAGE-XR) 500 MG 24 hr tablet Take 1,000 mg by mouth daily.    . metoprolol succinate (TOPROL-XL) 50 MG 24 hr tablet Take 50 mg by mouth daily. Take with or immediately following a meal.      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 Does not read. Takes care of a few children in her home. Granddaughter does IADLs. Fatigues quickly with mobility  Functional Status:  Mobility: Bed Mobility Overal bed mobility: Needs Assistance Bed Mobility: Supine to Sit, Sit to Supine Supine to sit: Min assist Sit to supine: Max assist, +2 for physical assistance General bed mobility comments: increased time and effort, assist to elevate trunk and assist of two to safely return to supine Transfers Overall transfer level: Needs assistance Equipment used: 2 person hand held assist Transfers: Sit to/from Stand Sit to Stand: Mod assist, +2 physical assistance Stand pivot transfers: Mod assist, +2 physical assistance General transfer comment: pt able to  power up, R LE with noted instability and knee buckling requiring therapist to block knee Ambulation/Gait General Gait Details: unable at this time    ADL: ADL Overall ADL's : Needs assistance/impaired Eating/Feeding:  Minimal assistance, Sitting(supported sitting) Eating/Feeding Details (indicate cue type and reason): Pt able to bring spoon to mouth with LUE Grooming: Moderate assistance, Sitting Grooming Details (indicate cue type and reason): Pt able to maintain sitting at EOB with Min Guard A for safety. Pt requiring increased assistance for bilateral activities due to poor funcitonal use of RUE (dominant) with brunnstromm stage 1 Upper Body Bathing: Moderate assistance, Sitting Lower Body Bathing: Maximal assistance, Sit to/from stand, +2 for physical assistance Upper Body Dressing : Moderate assistance, Sitting Lower Body Dressing: Maximal assistance, Sit to/from stand, +2 for physical assistance Toilet Transfer: Moderate assistance, +2 for physical assistance, Stand-pivot(simulated to recliner) Toilet Transfer Details (indicate cue type and reason): Pt performing stand pivot to reclienr with 2 person hand held assistance and blocking of R knee Functional mobility during ADLs: Moderate assistance, +2 for physical assistance, Cueing for sequencing(2 person hand held assist; stand pivot only) General ADL Comments: Pt demonstrating decreased fucnitonal performance and decreased balance, cognition, and fucntional use of RUE  Cognition: Cognition Overall Cognitive Status: Impaired/Different from baseline Arousal/Alertness: Awake/alert Orientation Level: Oriented to person, Disoriented to time, Disoriented to situation, Oriented to place Attention: Sustained, Selective Sustained Attention: Appears intact Selective Attention: Appears intact Memory: Appears intact Problem Solving: Appears intact Safety/Judgment: Appears intact Cognition Arousal/Alertness: Awake/alert Behavior During Therapy: Flat affect Overall Cognitive Status: Impaired/Different from baseline Area of Impairment: Following commands, Safety/judgement, Awareness, Problem solving, Attention Current Attention Level: Sustained Following  Commands: Follows one step commands with increased time, Follows one step commands inconsistently Safety/Judgement: Decreased awareness of safety, Decreased awareness of deficits Awareness: Intellectual Problem Solving: Slow processing, Requires verbal cues, Requires tactile cues General Comments: Pt with slow processing and requires increased time and cues for follow commands. Pt motivated to participate   Physical Exam: Blood pressure (!) 150/43, pulse 76, temperature 98.6 F (37 C), temperature source Oral, resp. rate 18, height 5' 5"  (1.651 m), weight 70.8 kg (156 lb 1.4 oz), SpO2 99 %. Physical Exam  Nursing note and vitals reviewed. Constitutional: She appears well-developed and well-nourished. No distress.  HENT:  Head: Normocephalic and atraumatic.  Mouth/Throat: Oropharynx is clear and moist.  Eyes: Conjunctivae are normal. Pupils are equal, round, and reactive to light.  Neck: Normal range of motion. Neck supple.  Cardiovascular: Normal rate and regular rhythm. Exam reveals no friction rub.  No murmur heard. Respiratory: Effort normal and breath sounds normal. No stridor. No respiratory distress. She has no wheezes.  GI: Soft. Bowel sounds are normal. She exhibits no distension. There is no tenderness.  Musculoskeletal: She exhibits no edema or tenderness.  Neurological: She is alert.  Right facial weakness with right inattention.  She was oriented to self and place with cues.  Garbled speech.  Easily distracted and needs redirection.  Had difficulty following simple motor command without verbal and tactile cues.  Right upper extremity is 0 out of 5 proximal to distal.  Right lower extremity is 2-3 out of 5 proximal to distal with inconsistent effort.  Left upper and left lower extremity grossly 4-5 out of 5.  Patient senses gross touch and pain in right arm and leg.    Skin: Skin is warm and dry. She is not diaphoretic.  Psychiatric: Her affect is blunt. Her speech is delayed and  slurred. She  is slowed. Cognition and memory are impaired. She is inattentive.    Results for orders placed or performed during the hospital encounter of 09/02/17 (from the past 48 hour(s))  Glucose, capillary     Status: Abnormal   Collection Time: 09/03/17 11:43 AM  Result Value Ref Range   Glucose-Capillary 148 (H) 65 - 99 mg/dL   Comment 1 Notify RN    Comment 2 Document in Chart   Glucose, capillary     Status: Abnormal   Collection Time: 09/03/17  3:42 PM  Result Value Ref Range   Glucose-Capillary 114 (H) 65 - 99 mg/dL   Comment 1 Notify RN    Comment 2 Document in Chart   Glucose, capillary     Status: Abnormal   Collection Time: 09/03/17  8:13 PM  Result Value Ref Range   Glucose-Capillary 172 (H) 65 - 99 mg/dL  Glucose, capillary     Status: None   Collection Time: 09/03/17 11:44 PM  Result Value Ref Range   Glucose-Capillary 73 65 - 99 mg/dL  CBC     Status: None   Collection Time: 09/04/17  2:53 AM  Result Value Ref Range   WBC 9.3 4.0 - 10.5 K/uL   RBC 4.58 3.87 - 5.11 MIL/uL   Hemoglobin 13.2 12.0 - 15.0 g/dL   HCT 39.1 36.0 - 46.0 %   MCV 85.4 78.0 - 100.0 fL   MCH 28.8 26.0 - 34.0 pg   MCHC 33.8 30.0 - 36.0 g/dL   RDW 13.3 11.5 - 15.5 %   Platelets 199 150 - 400 K/uL  Basic metabolic panel     Status: Abnormal   Collection Time: 09/04/17  2:53 AM  Result Value Ref Range   Sodium 140 135 - 145 mmol/L   Potassium 3.4 (L) 3.5 - 5.1 mmol/L   Chloride 105 101 - 111 mmol/L   CO2 26 22 - 32 mmol/L   Glucose, Bld 124 (H) 65 - 99 mg/dL   BUN 16 6 - 20 mg/dL   Creatinine, Ser 0.78 0.44 - 1.00 mg/dL   Calcium 9.3 8.9 - 10.3 mg/dL   GFR calc non Af Amer >60 >60 mL/min   GFR calc Af Amer >60 >60 mL/min    Comment: (NOTE) The eGFR has been calculated using the CKD EPI equation. This calculation has not been validated in all clinical situations. eGFR's persistently <60 mL/min signify possible Chronic Kidney Disease.    Anion gap 9 5 - 15  Magnesium     Status:  None   Collection Time: 09/04/17  2:53 AM  Result Value Ref Range   Magnesium 2.0 1.7 - 2.4 mg/dL  Phosphorus     Status: None   Collection Time: 09/04/17  2:53 AM  Result Value Ref Range   Phosphorus 4.6 2.5 - 4.6 mg/dL  Glucose, capillary     Status: Abnormal   Collection Time: 09/04/17  3:38 AM  Result Value Ref Range   Glucose-Capillary 128 (H) 65 - 99 mg/dL  Glucose, capillary     Status: Abnormal   Collection Time: 09/04/17  8:34 AM  Result Value Ref Range   Glucose-Capillary 137 (H) 65 - 99 mg/dL  Glucose, capillary     Status: Abnormal   Collection Time: 09/04/17 12:02 PM  Result Value Ref Range   Glucose-Capillary 157 (H) 65 - 99 mg/dL  Glucose, capillary     Status: Abnormal   Collection Time: 09/04/17  4:12 PM  Result Value Ref Range  Glucose-Capillary 175 (H) 65 - 99 mg/dL  Glucose, capillary     Status: None   Collection Time: 09/04/17  8:07 PM  Result Value Ref Range   Glucose-Capillary 85 65 - 99 mg/dL   Comment 1 Notify RN    Comment 2 Document in Chart   Glucose, capillary     Status: Abnormal   Collection Time: 09/05/17 12:36 AM  Result Value Ref Range   Glucose-Capillary 117 (H) 65 - 99 mg/dL   Comment 1 Notify RN    Comment 2 Document in Chart   Glucose, capillary     Status: Abnormal   Collection Time: 09/05/17  3:35 AM  Result Value Ref Range   Glucose-Capillary 103 (H) 65 - 99 mg/dL   Comment 1 Notify RN    Comment 2 Document in Chart   CBC     Status: None   Collection Time: 09/05/17  4:08 AM  Result Value Ref Range   WBC 9.4 4.0 - 10.5 K/uL   RBC 4.54 3.87 - 5.11 MIL/uL   Hemoglobin 12.9 12.0 - 15.0 g/dL   HCT 39.1 36.0 - 46.0 %   MCV 86.1 78.0 - 100.0 fL   MCH 28.4 26.0 - 34.0 pg   MCHC 33.0 30.0 - 36.0 g/dL   RDW 13.5 11.5 - 15.5 %   Platelets 196 150 - 400 K/uL  Basic metabolic panel     Status: Abnormal   Collection Time: 09/05/17  4:08 AM  Result Value Ref Range   Sodium 139 135 - 145 mmol/L   Potassium 4.0 3.5 - 5.1 mmol/L    Chloride 105 101 - 111 mmol/L   CO2 26 22 - 32 mmol/L   Glucose, Bld 124 (H) 65 - 99 mg/dL   BUN 16 6 - 20 mg/dL   Creatinine, Ser 0.77 0.44 - 1.00 mg/dL   Calcium 9.3 8.9 - 10.3 mg/dL   GFR calc non Af Amer >60 >60 mL/min   GFR calc Af Amer >60 >60 mL/min    Comment: (NOTE) The eGFR has been calculated using the CKD EPI equation. This calculation has not been validated in all clinical situations. eGFR's persistently <60 mL/min signify possible Chronic Kidney Disease.    Anion gap 8 5 - 15  Glucose, capillary     Status: Abnormal   Collection Time: 09/05/17  7:28 AM  Result Value Ref Range   Glucose-Capillary 123 (H) 65 - 99 mg/dL   Ct Angio Head W Or Wo Contrast  Result Date: 09/03/2017 CLINICAL DATA:  Stroke. Expressive aphasia. Right-sided weakness. Left basal ganglia hemorrhage. EXAM: CT ANGIOGRAPHY HEAD AND NECK TECHNIQUE: Multidetector CT imaging of the head and neck was performed using the standard protocol during bolus administration of intravenous contrast. Multiplanar CT image reconstructions and MIPs were obtained to evaluate the vascular anatomy. Carotid stenosis measurements (when applicable) are obtained utilizing NASCET criteria, using the distal internal carotid diameter as the denominator. CONTRAST:  50 cc Isovue-300 COMPARISON:  CT 09/02/2017 FINDINGS: CTA NECK FINDINGS Aortic arch: Aortic atherosclerosis. No aneurysm or dissection. 20% stenosis of the left subclavian artery origin. Right carotid system: Common carotid artery shows atherosclerotic plaque but no stenosis. Carotid bifurcation shows mild atherosclerotic plaque but no stenosis. Cervical internal carotid artery is tortuous but widely patent. Left carotid system: Common carotid artery shows plaque but no stenosis. Carotid bifurcation shows a small focus of calcified plaque but no stenosis or irregularity. Cervical ICA is tortuous but widely patent. Vertebral arteries: Left vertebral artery  is dominant. Left  vertebral artery origin is widely patent in the vessel is widely patent through the cervical region. Right vertebral artery is a very small vessel which is patent at its origin and through the cervical region to the foramen magnum. Skeleton: Minimal cervical spondylosis. Other neck: Contrast present in the esophagus from previous speech pathology study. No soft tissue neck lesion. Upper chest: Minimal scarring at the apices. Review of the MIP images confirms the above findings CTA HEAD FINDINGS Anterior circulation: Both internal carotid arteries are patent through the skullbase and siphon regions. There is ordinary peripheral atherosclerotic calcification in the carotid siphons but no stenosis more than about 20%. The anterior and middle cerebral vessels are patent without proximal stenosis, aneurysm or vascular malformation. No missing branch vessels are identified. Posterior circulation: The right vertebral artery terminates in PICA. Left vertebral artery supplies left PICA thin becomes the basilar. No basilar stenosis. Posterior circulation branch vessels are patent without proximal stenosis. Venous sinuses: Patent and normal. Anatomic variants: None significant Delayed phase: No abnormal enhancement. Left basal ganglia hemorrhage is no larger. Hematoma today measures 24 x 21 x 20 mm. Mild surrounding edema. No change in mass effect. Left-to-right shift of 1 or 2 mm. Review of the MIP images confirms the above findings IMPRESSION: No evidence of increased bleeding in the left basal ganglia. No evidence of increased mass effect. Aortic atherosclerosis. 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 Atherosclerotic plaque affecting the common carotid arteries but without stenosis. Both carotid bifurcations are widely patent. No posterior circulation compromise. Electronically Signed   By: Nelson Chimes M.D.   On: 09/03/2017 14:43   Dg Chest 1 View  Result Date: 09/03/2017 CLINICAL DATA:  Weakness and  shortness of breath EXAM: CHEST 1 VIEW COMPARISON:  None in PACs FINDINGS: The lungs are well-expanded there is mild biapical pleural thickening. There are coarse lung markings in the right lower lung partially obscuring the right heart border. There is no pleural effusion or pneumothorax. The heart is top-normal in size. There is calcification in the wall of the aortic arch. The bony thorax exhibits no acute abnormality. IMPRESSION: Right middle lobe atelectasis or pneumonia. No CHF. Followup PA and lateral chest X-ray is recommended in 3-4 weeks following trial of antibiotic therapy to ensure resolution and exclude underlying malignancy. Thoracic aortic atherosclerosis. Electronically Signed   By: David  Martinique M.D.   On: 09/03/2017 14:03   Ct Angio Neck W Or Wo Contrast  Result Date: 09/03/2017 CLINICAL DATA:  Stroke. Expressive aphasia. Right-sided weakness. Left basal ganglia hemorrhage. EXAM: CT ANGIOGRAPHY HEAD AND NECK TECHNIQUE: Multidetector CT imaging of the head and neck was performed using the standard protocol during bolus administration of intravenous contrast. Multiplanar CT image reconstructions and MIPs were obtained to evaluate the vascular anatomy. Carotid stenosis measurements (when applicable) are obtained utilizing NASCET criteria, using the distal internal carotid diameter as the denominator. CONTRAST:  50 cc Isovue-300 COMPARISON:  CT 09/02/2017 FINDINGS: CTA NECK FINDINGS Aortic arch: Aortic atherosclerosis. No aneurysm or dissection. 20% stenosis of the left subclavian artery origin. Right carotid system: Common carotid artery shows atherosclerotic plaque but no stenosis. Carotid bifurcation shows mild atherosclerotic plaque but no stenosis. Cervical internal carotid artery is tortuous but widely patent. Left carotid system: Common carotid artery shows plaque but no stenosis. Carotid bifurcation shows a small focus of calcified plaque but no stenosis or irregularity. Cervical ICA is  tortuous but widely patent. Vertebral arteries: Left vertebral artery is dominant. Left vertebral  artery origin is widely patent in the vessel is widely patent through the cervical region. Right vertebral artery is a very small vessel which is patent at its origin and through the cervical region to the foramen magnum. Skeleton: Minimal cervical spondylosis. Other neck: Contrast present in the esophagus from previous speech pathology study. No soft tissue neck lesion. Upper chest: Minimal scarring at the apices. Review of the MIP images confirms the above findings CTA HEAD FINDINGS Anterior circulation: Both internal carotid arteries are patent through the skullbase and siphon regions. There is ordinary peripheral atherosclerotic calcification in the carotid siphons but no stenosis more than about 20%. The anterior and middle cerebral vessels are patent without proximal stenosis, aneurysm or vascular malformation. No missing branch vessels are identified. Posterior circulation: The right vertebral artery terminates in PICA. Left vertebral artery supplies left PICA thin becomes the basilar. No basilar stenosis. Posterior circulation branch vessels are patent without proximal stenosis. Venous sinuses: Patent and normal. Anatomic variants: None significant Delayed phase: No abnormal enhancement. Left basal ganglia hemorrhage is no larger. Hematoma today measures 24 x 21 x 20 mm. Mild surrounding edema. No change in mass effect. Left-to-right shift of 1 or 2 mm. Review of the MIP images confirms the above findings IMPRESSION: No evidence of increased bleeding in the left basal ganglia. No evidence of increased mass effect. Aortic atherosclerosis. 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 Atherosclerotic plaque affecting the common carotid arteries but without stenosis. Both carotid bifurcations are widely patent. No posterior circulation compromise. Electronically Signed   By: Nelson Chimes M.D.   On:  09/03/2017 14:43   Mr Brain Wo Contrast  Result Date: 09/04/2017 CLINICAL DATA:  Left basal ganglia hemorrhage. Aphasia. Right-sided weakness. EXAM: MRI HEAD WITHOUT CONTRAST TECHNIQUE: Multiplanar, multiecho pulse sequences of the brain and surrounding structures were obtained without intravenous contrast. COMPARISON:  Head and neck CTA 09/03/2017.  Head CT 09/02/2017. FINDINGS: Multiple sequences are moderately motion degraded. Brain: The hemorrhage centered in the posterior left lentiform nucleus is unchanged in size, measuring 2.2 x 2.2 cm. There is mild surrounding edema. Extending from the superior aspect of the hemorrhage into the corona radiata and right caudate body is a 15 x 11 mm acute infarct. Confluent T2 hyperintensities throughout the subcortical and deep cerebral white matter bilaterally are compatible with advanced chronic small vessel ischemic disease. There is mild cerebral atrophy. No midline shift or extra-axial fluid collection is seen. There is a small chronic cortical infarct in the anterolateral left parietal lobe. Vascular: Poor visualization of the distal right vertebral artery, hypoplastic and shown to terminate in PICA on the prior CTA. Other major intracranial vascular flow voids are preserved. Skull and upper cervical spine: Unremarkable bone marrow signal. Sinuses/Orbits: Unremarkable orbits. Mild posterior right ethmoid air cell mucosal thickening. No significant mastoid fluid. Other: None. IMPRESSION: 1. Hemorrhagic left basal ganglia infarct. 2. Advanced chronic small vessel ischemic disease. 3. Chronic left parietal infarct. Electronically Signed   By: Logan Bores M.D.   On: 09/04/2017 14:11   Dg Swallowing Func-speech Pathology  Result Date: 09/03/2017 Objective Swallowing Evaluation: Type of Study: MBS-Modified Barium Swallow Study  Patient Details Name: Tatum Corl MRN: [id number] Date of Birth: 12-11-1950 Today's Date: 09/03/2017 Time: SLP Start Time (ACUTE ONLY):  1330 -SLP Stop Time (ACUTE ONLY): 1350 SLP Time Calculation (min) (ACUTE ONLY): 20 min Past Medical History: Past Medical History: Diagnosis Date . Diabetes mellitus without complication ([place])  . Hyperlipidemia  . Hypertension  Past Surgical  History: No past surgical history on file. HPI: 66 year old female with basal ganglia hemorrhage, likely hypertensive in etiology. CT shows Hyperdense 2.7 cm area of hemorrhage in the posterior left lentiform nuclei with surrounding edema.  No Data Recorded Assessment / Plan / Recommendation CHL IP CLINICAL IMPRESSIONS 09/03/2017 Clinical Impression   Pt demonstrates a moderate oral dysphagia due to impaired lingual coordination for bolus propulsion. Preparation of solids is laborius and results in significant premature spillage of bolus mixing with saliva prior to swallow initaition, posing significant aspiration risk. Oropharyngeal function is strong, but swallow initiation is delayed. Depending on method of intake (cup vs straw), rate and bolus size pt has instances of sensed aspiration before the swallow. Respiratory drive is impaired for volitional and reflexive coughing and aspirate is not fully expectorated. Pt tolerates nectar straw well and can consume thin liquids with a straw and a chin tuck. Will initiate a conservative diet of nectar thick liquids and puree and advance at bedside as pt exhibits consistency with a chin tuck. Pt would also benefit from EMST to improve expiratory drive.  tuck. Pt would also benefit from EMST to improve expiratory drive.  SLP Visit Diagnosis Dysphagia, oropharyngeal phase (R13.12) Attention and concentration deficit following -- Frontal lobe and executive function deficit following -- Impact on safety and function Moderate aspiration risk   CHL IP TREATMENT RECOMMENDATION 09/03/2017 Treatment Recommendations Therapy as outlined in treatment plan below   Prognosis 09/03/2017 Prognosis for Safe Diet Advancement Good Barriers to Reach  Goals -- Barriers/Prognosis Comment -- CHL IP DIET RECOMMENDATION 09/03/2017 SLP Diet Recommendations Dysphagia 1 (Puree) solids;Nectar thick liquid Liquid Administration via Straw Medication Administration Whole meds with puree Compensations Slow rate;Small sips/bites;Use straw to facilitate chin tuck Postural Changes Seated upright at 90 degrees   CHL IP OTHER RECOMMENDATIONS 09/03/2017 Recommended Consults -- Oral Care Recommendations Oral care BID Other Recommendations --   CHL IP FOLLOW UP RECOMMENDATIONS 09/03/2017 Follow up Recommendations Inpatient Rehab   CHL IP FREQUENCY AND DURATION 09/03/2017 Speech Therapy Frequency (ACUTE ONLY) min 2x/week Treatment Duration 2 weeks      CHL IP ORAL PHASE 09/03/2017 Oral Phase Impaired Oral - Pudding Teaspoon -- Oral - Pudding Cup -- Oral - Honey Teaspoon Reduced posterior propulsion;Delayed oral transit Oral - Honey Cup -- Oral - Nectar Teaspoon Reduced posterior propulsion;Delayed oral transit Oral - Nectar Cup -- Oral - Nectar Straw Reduced posterior propulsion;Delayed oral transit Oral - Thin Teaspoon -- Oral - Thin Cup Reduced posterior propulsion;Delayed oral transit Oral - Thin Straw Delayed oral transit Oral - Puree Delayed oral transit Oral - Mech Soft Delayed oral transit;Weak lingual manipulation;Impaired mastication Oral - Regular -- Oral - Multi-Consistency -- Oral - Pill -- Oral Phase - Comment --  CHL IP PHARYNGEAL PHASE 09/03/2017 Pharyngeal Phase Impaired Pharyngeal- Pudding Teaspoon -- Pharyngeal -- Pharyngeal- Pudding Cup -- Pharyngeal -- Pharyngeal- Honey Teaspoon Delayed swallow initiation-vallecula Pharyngeal -- Pharyngeal- Honey Cup -- Pharyngeal -- Pharyngeal- Nectar Teaspoon Delayed swallow initiation-vallecula Pharyngeal Material does not enter airway Pharyngeal- Nectar Cup -- Pharyngeal -- Pharyngeal- Nectar Straw Delayed swallow initiation-vallecula;Delayed swallow initiation-pyriform sinuses;Penetration/Aspiration before swallow Pharyngeal  Material enters airway, remains ABOVE vocal cords then ejected out;Material does not enter airway Pharyngeal- Thin Teaspoon -- Pharyngeal -- Pharyngeal- Thin Cup Penetration/Aspiration before swallow;Delayed swallow initiation-pyriform sinuses;Moderate aspiration Pharyngeal Material enters airway, passes BELOW cords and not ejected out despite cough attempt by patient Pharyngeal- Thin Straw Delayed swallow initiation-pyriform sinuses;Penetration/Aspiration before swallow;Compensatory strategies attempted (with notebox) Pharyngeal Material enters airway, CONTACTS cords and  then ejected out;Material does not enter airway Pharyngeal- Puree Delayed swallow initiation-vallecula Pharyngeal -- Pharyngeal- Mechanical Soft Delayed swallow initiation-pyriform sinuses Pharyngeal -- Pharyngeal- Regular -- Pharyngeal -- Pharyngeal- Multi-consistency -- Pharyngeal -- Pharyngeal- Pill -- Pharyngeal -- Pharyngeal Comment --  No flowsheet data found. No flowsheet data found. Herbie [place], [place] CCC-SLP [phone] Othelia Pulling Katherene Ponto 09/03/2017, 2:50 PM                  Medical Problem List and Plan: 1.  Functional, cognitive and mobility deficits  secondary to hemorrhagic left basal ganglia infarct   -Admit to inpatient rehab 2.  DVT Prophylaxis/Anticoagulation: Mechanical: Sequential compression devices, below knee Bilateral lower extremities 3. Pain Management: N/A 4. Mood: LCSW to follow for evaluation and support as mentation improves.  5. Neuropsych: This patient is not capable of making decisions on her own behalf. 6. Skin/Wound Care: routine pressure relief measures.  7. Fluids/Electrolytes/Nutrition: Monitor I/O. Check lytes in am 8. HTN: Monitor BP qid. On lisinopril, HCTZ and metoprolol. Monitor renal status while on nectars.  9. Leucocytosis: Question due to RML aspiration PNA --being monitored and has resolved. Monitor for fevers or other signs of infection. Repeat CXR in am.  10 Dysphagia: Continue  dysphagia 2, nectar liquids.Monitor hydration status with routine checks.  11. Dyslipidemia: On Lipitor  12. T2DM: Hgb A1C-7.1.  Monitor BS ac/hs. Resume metformin.     Post Admission Physician Evaluation: 1. Functional deficits secondary  to hemorrhagic left basal ganglia infarct. 2. Patient is admitted to receive collaborative, interdisciplinary care between the physiatrist, rehab nursing staff, and therapy team. 3. Patient's level of medical complexity and substantial therapy needs in context of that medical necessity cannot be provided at a lesser intensity of care such as a SNF. 4. Patient has experienced substantial functional loss from his/her baseline which was documented above under the "Functional History" and "Functional Status" headings.  Judging by the patient's diagnosis, physical exam, and functional history, the patient has potential for functional progress which will result in measurable gains while on inpatient rehab.  These gains will be of substantial and practical use upon discharge  in facilitating mobility and self-care at the household level. 5. Physiatrist will provide 24 hour management of medical needs as well as oversight of the therapy plan/treatment and provide guidance as appropriate regarding the interaction of the two. 6. The Preadmission Screening has been reviewed and patient status is unchanged unless otherwise stated above. 7. 24 hour rehab nursing will assist with bladder management, bowel management, safety, skin/wound care, disease management, medication administration, pain management and patient education  and help integrate therapy concepts, techniques,education, etc. 8. PT will assess and treat for/with: Lower extremity strength, range of motion, stamina, balance, functional mobility, safety, adaptive techniques and equipment, neuromuscular reeducation, family education.   Goals are: Minimal assistance. 9. OT will assess and treat for/with: ADL's, functional  mobility, safety, upper extremity strength, adaptive techniques and equipment , neuromuscular reeducation, family education, ego support, visual spatial awareness.   Goals are: Supervision to minimal assistance. Therapy may proceed with showering this patient. 10. SLP will assess and treat for/with: speech, cognition, and  communication.  Goals are: Supervision. 11. Case Management and Social Worker will assess and treat for psychological issues and discharge planning. 12. Team conference will be held weekly to assess progress toward goals and to determine barriers to discharge. 13. Patient will receive at least 3 hours of therapy per day at least 5 days per week. 14. ELOS: 15-20 days  15. Prognosis:  excellent     Meredith Staggers, MD, [facility] 09/06/2017  Bary Leriche, Hershal Coria 09/05/2017

## 2017-09-05 NOTE — H&P (Deleted)
  The note originally documented on this encounter has been moved the the encounter in which it belongs.  

## 2017-09-05 NOTE — Progress Notes (Addendum)
Inpatient Rehabilitation  Met with patient and son with Spanish interpreter, Spero Geralds present at bedside to discuss team's recommendation for IP Rehab.  Shared booklets, insurance verification letter, and answered questions.  Plan to proceed with admission Saturday 09/06/17 pending medical clearance by Dr. Naaman Plummer.  Discussed with team.  Call charge nurse at [phone] if questions.    Carmelia Roller., CCC/SLP Admission Coordinator  [facility]  Cell [phone]

## 2017-09-06 ENCOUNTER — Inpatient Hospital Stay (HOSPITAL_COMMUNITY): Payer: Self-pay

## 2017-09-06 ENCOUNTER — Inpatient Hospital Stay (HOSPITAL_COMMUNITY)
Admission: EM | Admit: 2017-09-06 | Discharge: 2017-09-30 | DRG: 057 | Disposition: A | Discharge status: Home Health | Payer: Self-pay | Source: Intra-hospital | Attending: Physical Medicine & Rehabilitation | Admitting: Physical Medicine & Rehabilitation

## 2017-09-06 ENCOUNTER — Encounter (HOSPITAL_COMMUNITY): Payer: Self-pay

## 2017-09-06 ENCOUNTER — Other Ambulatory Visit: Payer: Self-pay

## 2017-09-06 DIAGNOSIS — I69154 Hemiplegia and hemiparesis following nontraumatic intracerebral hemorrhage affecting left non-dominant side: Principal | ICD-10-CM

## 2017-09-06 DIAGNOSIS — R1310 Dysphagia, unspecified: Secondary | ICD-10-CM | POA: Diagnosis present

## 2017-09-06 DIAGNOSIS — I69391 Dysphagia following cerebral infarction: Secondary | ICD-10-CM

## 2017-09-06 DIAGNOSIS — I69131 Monoplegia of upper limb following nontraumatic intracerebral hemorrhage affecting right dominant side: Secondary | ICD-10-CM

## 2017-09-06 DIAGNOSIS — G8191 Hemiplegia, unspecified affecting right dominant side: Secondary | ICD-10-CM | POA: Diagnosis present

## 2017-09-06 DIAGNOSIS — E785 Hyperlipidemia, unspecified: Secondary | ICD-10-CM | POA: Diagnosis present

## 2017-09-06 DIAGNOSIS — I610 Nontraumatic intracerebral hemorrhage in hemisphere, subcortical: Principal | ICD-10-CM

## 2017-09-06 DIAGNOSIS — E669 Obesity, unspecified: Secondary | ICD-10-CM | POA: Diagnosis present

## 2017-09-06 DIAGNOSIS — E119 Type 2 diabetes mellitus without complications: Secondary | ICD-10-CM

## 2017-09-06 DIAGNOSIS — I612 Nontraumatic intracerebral hemorrhage in hemisphere, unspecified: Secondary | ICD-10-CM

## 2017-09-06 DIAGNOSIS — I10 Essential (primary) hypertension: Secondary | ICD-10-CM | POA: Diagnosis present

## 2017-09-06 DIAGNOSIS — Z6829 Body mass index (BMI) 29.0-29.9, adult: Secondary | ICD-10-CM

## 2017-09-06 DIAGNOSIS — I619 Nontraumatic intracerebral hemorrhage, unspecified: Secondary | ICD-10-CM | POA: Diagnosis present

## 2017-09-06 DIAGNOSIS — I69120 Aphasia following nontraumatic intracerebral hemorrhage: Secondary | ICD-10-CM

## 2017-09-06 DIAGNOSIS — E876 Hypokalemia: Secondary | ICD-10-CM | POA: Diagnosis present

## 2017-09-06 DIAGNOSIS — I639 Cerebral infarction, unspecified: Secondary | ICD-10-CM

## 2017-09-06 DIAGNOSIS — Z7984 Long term (current) use of oral hypoglycemic drugs: Secondary | ICD-10-CM

## 2017-09-06 DIAGNOSIS — E1151 Type 2 diabetes mellitus with diabetic peripheral angiopathy without gangrene: Secondary | ICD-10-CM | POA: Diagnosis present

## 2017-09-06 DIAGNOSIS — I69191 Dysphagia following nontraumatic intracerebral hemorrhage: Secondary | ICD-10-CM

## 2017-09-06 DIAGNOSIS — E1169 Type 2 diabetes mellitus with other specified complication: Secondary | ICD-10-CM | POA: Diagnosis present

## 2017-09-06 DIAGNOSIS — Z79899 Other long term (current) drug therapy: Secondary | ICD-10-CM

## 2017-09-06 DIAGNOSIS — Z09 Encounter for follow-up examination after completed treatment for conditions other than malignant neoplasm: Secondary | ICD-10-CM

## 2017-09-06 LAB — GLUCOSE, CAPILLARY
GLUCOSE-CAPILLARY: 110 mg/dL — AB (ref 65–99)
GLUCOSE-CAPILLARY: 112 mg/dL — AB (ref 65–99)
GLUCOSE-CAPILLARY: 122 mg/dL — AB (ref 65–99)
Glucose-Capillary: 115 mg/dL — ABNORMAL HIGH (ref 65–99)
Glucose-Capillary: 167 mg/dL — ABNORMAL HIGH (ref 65–99)

## 2017-09-06 MED ORDER — INSULIN ASPART 100 UNIT/ML ~~LOC~~ SOLN
0.0000 [IU] | Freq: Three times a day (TID) | SUBCUTANEOUS | Status: DC
  Administered 2017-09-07 (×3): 1 [IU] via SUBCUTANEOUS
  Administered 2017-09-09 (×2): 2 [IU] via SUBCUTANEOUS
  Administered 2017-09-10 – 2017-09-12 (×2): 1 [IU] via SUBCUTANEOUS
  Administered 2017-09-13: 2 [IU] via SUBCUTANEOUS
  Administered 2017-09-14: 1 [IU] via SUBCUTANEOUS
  Administered 2017-09-14: 2 [IU] via SUBCUTANEOUS
  Administered 2017-09-15 – 2017-09-20 (×10): 1 [IU] via SUBCUTANEOUS
  Administered 2017-09-20: 2 [IU] via SUBCUTANEOUS
  Administered 2017-09-21: 3 [IU] via SUBCUTANEOUS
  Administered 2017-09-22: 2 [IU] via SUBCUTANEOUS
  Administered 2017-09-23 – 2017-09-25 (×3): 1 [IU] via SUBCUTANEOUS
  Administered 2017-09-27: 2 [IU] via SUBCUTANEOUS

## 2017-09-06 MED ORDER — METFORMIN HCL ER 500 MG PO TB24
1000.0000 mg | ORAL_TABLET | Freq: Every day | ORAL | Status: DC
  Administered 2017-09-07 – 2017-09-30 (×24): 1000 mg via ORAL
  Filled 2017-09-06 (×24): qty 2

## 2017-09-06 MED ORDER — LISINOPRIL 20 MG PO TABS
20.0000 mg | ORAL_TABLET | Freq: Every day | ORAL | Status: DC
  Administered 2017-09-07 – 2017-09-10 (×4): 20 mg via ORAL
  Filled 2017-09-06 (×4): qty 1

## 2017-09-06 MED ORDER — ACETAMINOPHEN 325 MG PO TABS
325.0000 mg | ORAL_TABLET | ORAL | Status: DC | PRN
  Filled 2017-09-06: qty 2

## 2017-09-06 MED ORDER — DIPHENHYDRAMINE HCL 12.5 MG/5ML PO ELIX: 12.5000 mg | ORAL_SOLUTION | Freq: Four times a day (QID) | ORAL | Status: DC | PRN

## 2017-09-06 MED ORDER — BISACODYL 10 MG RE SUPP: 10.0000 mg | Freq: Every day | RECTAL | Status: DC | PRN

## 2017-09-06 MED ORDER — TRAZODONE HCL 50 MG PO TABS
25.0000 mg | ORAL_TABLET | Freq: Every evening | ORAL | Status: DC | PRN
  Administered 2017-09-06 – 2017-09-07 (×2): 50 mg via ORAL
  Filled 2017-09-06 (×2): qty 1

## 2017-09-06 MED ORDER — ALUM & MAG HYDROXIDE-SIMETH 200-200-20 MG/5ML PO SUSP: 30.0000 mL | ORAL | Status: DC | PRN

## 2017-09-06 MED ORDER — METOPROLOL SUCCINATE ER 50 MG PO TB24
50.0000 mg | ORAL_TABLET | Freq: Every day | ORAL | Status: DC
  Administered 2017-09-07 – 2017-09-30 (×22): 50 mg via ORAL
  Filled 2017-09-06 (×25): qty 1

## 2017-09-06 MED ORDER — INSULIN ASPART 100 UNIT/ML ~~LOC~~ SOLN: 0.0000 [IU] | Freq: Every day | SUBCUTANEOUS | Status: DC

## 2017-09-06 MED ORDER — PANTOPRAZOLE SODIUM 40 MG PO TBEC
40.0000 mg | DELAYED_RELEASE_TABLET | Freq: Every day | ORAL | Status: DC
  Administered 2017-09-06 – 2017-09-29 (×24): 40 mg via ORAL
  Filled 2017-09-06 (×24): qty 1

## 2017-09-06 MED ORDER — HYDROCHLOROTHIAZIDE 12.5 MG PO CAPS
12.5000 mg | ORAL_CAPSULE | Freq: Every day | ORAL | Status: DC
  Administered 2017-09-07 – 2017-09-13 (×7): 12.5 mg via ORAL
  Filled 2017-09-06 (×7): qty 1

## 2017-09-06 MED ORDER — PROCHLORPERAZINE EDISYLATE 5 MG/ML IJ SOLN: 5.0000 mg | Freq: Four times a day (QID) | INTRAMUSCULAR | Status: DC | PRN

## 2017-09-06 MED ORDER — PROCHLORPERAZINE MALEATE 5 MG PO TABS: 5.0000 mg | ORAL_TABLET | Freq: Four times a day (QID) | ORAL | Status: DC | PRN

## 2017-09-06 MED ORDER — RESOURCE THICKENUP CLEAR PO POWD
ORAL | Status: DC | PRN
Start: 2017-09-06 — End: 2017-09-30
  Filled 2017-09-06: qty 125

## 2017-09-06 MED ORDER — FLEET ENEMA 7-19 GM/118ML RE ENEM: 1.0000 | ENEMA | Freq: Once | RECTAL | Status: DC | PRN

## 2017-09-06 MED ORDER — POLYETHYLENE GLYCOL 3350 17 G PO PACK
17.0000 g | PACK | Freq: Every day | ORAL | Status: DC | PRN
  Filled 2017-09-06: qty 1

## 2017-09-06 MED ORDER — GUAIFENESIN-DM 100-10 MG/5ML PO SYRP: 5.0000 mL | ORAL_SOLUTION | Freq: Four times a day (QID) | ORAL | Status: DC | PRN

## 2017-09-06 MED ORDER — SENNOSIDES-DOCUSATE SODIUM 8.6-50 MG PO TABS
1.0000 | ORAL_TABLET | Freq: Two times a day (BID) | ORAL | Status: DC
  Administered 2017-09-07 – 2017-09-30 (×42): 1 via ORAL
  Filled 2017-09-06 (×45): qty 1

## 2017-09-06 MED ORDER — ATORVASTATIN CALCIUM 10 MG PO TABS
10.0000 mg | ORAL_TABLET | Freq: Every day | ORAL | Status: DC
  Administered 2017-09-06 – 2017-09-29 (×24): 10 mg via ORAL
  Filled 2017-09-06 (×24): qty 1

## 2017-09-06 MED ORDER — PROCHLORPERAZINE 25 MG RE SUPP: 12.5000 mg | Freq: Four times a day (QID) | RECTAL | Status: DC | PRN

## 2017-09-06 NOTE — H&P (Signed)
Physical Medicine and Rehabilitation Admission H&P    Chief Complaint  Patient presents with  . Functional deficits due to aphasia, left sided weakness and dysphagia    HPI:  Cindy Thornton is a 66 y.o. female with history of T2DM, HTN who was admitted on 09/02/17 after being found by family with left sided weakness, right facial droop and difficulty speaking. History taken from chart review and son.  CT head reviewed, showing left basal ganglia hemorrhage  with surrounding edema and mild regional mass effect.  Dr. Pearlean BrownieSethi felt to be hypertensive in nature and she was started on nicardipine drip.  MRI brain done revealing hemorrhage in left basal ganglia infarct, advanced chronic small vessel disease and chronic left parietal infarct. 2D echo showed EF 60-65% with moderate LVH, mild aortic stenosis and trivial  MVR.  CTA head/neck done today revealing no evidence of increase in bleeding, aortic atherosclerosis and 20% stenosis of proximal L-SA.  MBS done 12/12 and she was placed on dysphagia 1, nectar liquids. Mentation improving but verbal output with gibberish and severe dysarthria.  Patient with resultant right sided weakness with sensory deficits as well as severe dysarthria with minimal breath support--question aphasia. CIR recommended by therapy team due to functional decline.    Review of Systems  Unable to perform ROS: Language  Respiratory: Negative for shortness of breath.   Cardiovascular: Negative for chest pain.  Gastrointestinal: Negative for abdominal pain.  Neurological: Positive for speech change and focal weakness. Negative for headaches.      Past Medical History:  Diagnosis Date  . Diabetes mellitus without complication (HCC)   . Hyperlipidemia   . Hypertension     History reviewed. No pertinent surgical history.    Family History  Problem Relation Age of Onset  . Healthy Sister     Social History:     Allergies: No Known Allergies    Medications  Prior to Admission  Medication Sig Dispense Refill  . atorvastatin (LIPITOR) 20 MG tablet Take 20 mg by mouth daily.    Marland Kitchen. lisinopril-hydrochlorothiazide (PRINZIDE,ZESTORETIC) 10-12.5 MG tablet Take 1 tablet by mouth daily.    . metFORMIN (GLUCOPHAGE-XR) 500 MG 24 hr tablet Take 1,000 mg by mouth daily.    . metoprolol succinate (TOPROL-XL) 50 MG 24 hr tablet Take 50 mg by mouth daily. Take with or immediately following a meal.      Drug Regimen Review  Drug regimen was reviewed and remains appropriate with no significant issues identified  Home: Home Living Family/patient expects to be discharged to:: Inpatient rehab Living Arrangements: Other relatives   Functional History:    Functional Status:  Mobility:          ADL:    Cognition: Cognition Orientation Level: Oriented to person, Oriented to place, Oriented to situation, Disoriented to time, Appropriate for developmental age    Physical Exam: Blood pressure (!) 161/53, pulse 75, temperature 98.6 F (37 C), temperature source Oral, resp. rate 18, SpO2 95 %. Physical Exam  Nursing note and vitals reviewed. Constitutional: She appears well-developed and well-nourished. No distress.  HENT:  Head: Normocephalic and atraumatic.  Mouth/Throat: Oropharynx is clear and moist.  Eyes: Conjunctivae are normal. Pupils are equal, round, and reactive to light.  Neck: Normal range of motion. Neck supple.  Cardiovascular: Normal rate and regular rhythm. Exam reveals no friction rub.  No murmur heard. Respiratory: Effort normal and breath sounds normal. No stridor. No respiratory distress. She has no wheezes.  GI: Soft.  Bowel sounds are normal. She exhibits no distension. There is no tenderness.  Musculoskeletal: She exhibits no edema or tenderness.  Neurological: She is alert.  Right facial weakness with right inattention.  She was oriented to self and place with cues.  Garbled speech.  Easily distracted and needs redirection.   Had difficulty following simple motor command without verbal and tactile cues.  Right upper extremity is 0 out of 5 proximal to distal.  Right lower extremity is 2-3 out of 5 proximal to distal with inconsistent effort.  Left upper and left lower extremity grossly 4-5 out of 5.  Patient senses gross touch and pain in right arm and leg.    Skin: Skin is warm and dry. She is not diaphoretic.  Psychiatric: Her affect is blunt. Her speech is delayed and slurred. She is slowed. Cognition and memory are impaired. She is inattentive.    Results for orders placed or performed during the hospital encounter of 09/06/17 (from the past 48 hour(s))  Glucose, capillary     Status: Abnormal   Collection Time: 09/06/17  4:08 PM  Result Value Ref Range   Glucose-Capillary 115 (H) 65 - 99 mg/dL   No results found.     Medical Problem List and Plan: 1.  Functional, cognitive and mobility deficits  secondary to hemorrhagic left basal ganglia infarct   -Admit to inpatient rehab 2.  DVT Prophylaxis/Anticoagulation: Mechanical: Sequential compression devices, below knee Bilateral lower extremities 3. Pain Management: N/A 4. Mood: LCSW to follow for evaluation and support as mentation improves.  5. Neuropsych: This patient is not capable of making decisions on her own behalf. 6. Skin/Wound Care: routine pressure relief measures.  7. Fluids/Electrolytes/Nutrition: Monitor I/O. Check lytes in am 8. HTN: Monitor BP qid. On lisinopril, HCTZ and metoprolol. Monitor renal status while on nectars.  9. Leucocytosis: Question due to RML aspiration PNA --being monitored and has resolved. Monitor for fevers or other signs of infection. Repeat CXR in am.  10 Dysphagia: Continue dysphagia 2, nectar liquids.Monitor hydration status with routine checks.  11. Dyslipidemia: On Lipitor  12. T2DM: Hgb A1C-7.1.  Monitor BS ac/hs. Resume metformin.     Post Admission Physician Evaluation: 1. Functional deficits secondary  to  hemorrhagic left basal ganglia infarct. 2. Patient is admitted to receive collaborative, interdisciplinary care between the physiatrist, rehab nursing staff, and therapy team. 3. Patient's level of medical complexity and substantial therapy needs in context of that medical necessity cannot be provided at a lesser intensity of care such as a SNF. 4. Patient has experienced substantial functional loss from his/her baseline which was documented above under the "Functional History" and "Functional Status" headings.  Judging by the patient's diagnosis, physical exam, and functional history, the patient has potential for functional progress which will result in measurable gains while on inpatient rehab.  These gains will be of substantial and practical use upon discharge  in facilitating mobility and self-care at the household level. 5. Physiatrist will provide 24 hour management of medical needs as well as oversight of the therapy plan/treatment and provide guidance as appropriate regarding the interaction of the two. 6. The Preadmission Screening has been reviewed and patient status is unchanged unless otherwise stated above. 7. 24 hour rehab nursing will assist with bladder management, bowel management, safety, skin/wound care, disease management, medication administration, pain management and patient education  and help integrate therapy concepts, techniques,education, etc. 8. PT will assess and treat for/with: Lower extremity strength, range of motion, stamina, balance, functional  mobility, safety, adaptive techniques and equipment, neuromuscular reeducation, family education.   Goals are: Minimal assistance. 9. OT will assess and treat for/with: ADL's, functional mobility, safety, upper extremity strength, adaptive techniques and equipment , neuromuscular reeducation, family education, ego support, visual spatial awareness.   Goals are: Supervision to minimal assistance. Therapy may proceed with showering  this patient. 10. SLP will assess and treat for/with: speech, cognition, and  communication.  Goals are: Supervision. 11. Case Management and Social Worker will assess and treat for psychological issues and discharge planning. 12. Team conference will be held weekly to assess progress toward goals and to determine barriers to discharge. 13. Patient will receive at least 3 hours of therapy per day at least 5 days per week. 14. ELOS: 15-20 days       15. Prognosis:  excellent     Ranelle Oyster, MD, [facility] 09/06/2017  Ranelle Oyster, MD 09/06/2017

## 2017-09-06 NOTE — Discharge Summary (Signed)
Stroke Discharge Summary  Patient ID: Cindy Thornton    l   MRN: [id number]      DOB: 1951-08-31  Date of Admission: 09/02/2017 Date of Discharge: 09/06/2017  Attending Physician:  Marvel PlanXu, Billie Intriago, MD, Stroke MD Consultant(s):   rehabilitation medicine Dr Allena KatzPatel Patient's PCP:  No primary care provider on file.  Discharge Diagnoses: left basal ganglia ICH Active Problems:   Stroke (cerebrum) (HCC)   Leukocytosis   Dysphagia, post-stroke   Benign essential HTN   Diabetes mellitus type 2 in obese [facility](HCC)  Past Medical History:  Diagnosis Date  . Diabetes mellitus without complication (HCC)   . Hyperlipidemia   . Hypertension    History reviewed. No pertinent surgical history.  Medications to be continued on Rehab . atorvastatin  10 mg Oral q1800  . hydrochlorothiazide  12.5 mg Oral Daily  . insulin aspart  2-6 Units Subcutaneous Q4H  . lisinopril  20 mg Oral Daily  . mouth rinse  15 mL Mouth Rinse BID  . metoprolol succinate  50 mg Oral Daily  . pantoprazole  40 mg Oral Daily  . senna-docusate  1 tablet Oral BID    LABORATORY STUDIES CBC    Component Value Date/Time   WBC 9.4 09/05/2017 0408   RBC 4.54 09/05/2017 0408   HGB 12.9 09/05/2017 0408   HCT 39.1 09/05/2017 0408   PLT 196 09/05/2017 0408   MCV 86.1 09/05/2017 0408   MCH 28.4 09/05/2017 0408   MCHC 33.0 09/05/2017 0408   RDW 13.5 09/05/2017 0408   LYMPHSABS 3.1 09/02/2017 1811   MONOABS 0.8 09/02/2017 1811   EOSABS 0.3 09/02/2017 1811   BASOSABS 0.1 09/02/2017 1811   CMP    Component Value Date/Time   NA 139 09/05/2017 0408   K 4.0 09/05/2017 0408   CL 105 09/05/2017 0408   CO2 26 09/05/2017 0408   GLUCOSE 124 (H) 09/05/2017 0408   BUN 16 09/05/2017 0408   CREATININE 0.77 09/05/2017 0408   CALCIUM 9.3 09/05/2017 0408   PROT 7.0 09/02/2017 1811   ALBUMIN 3.4 (L) 09/02/2017 1811   AST 25 09/02/2017 1811   ALT 20 09/02/2017 1811   ALKPHOS 78 09/02/2017 1811   BILITOT <0.1 (L) 09/02/2017 1811    GFRNONAA >60 09/05/2017 0408   GFRAA >60 09/05/2017 0408   COAGS Lab Results  Component Value Date   INR 0.93 09/02/2017   Lipid Panel    Component Value Date/Time   CHOL 114 09/03/2017 0804   TRIG 113 09/03/2017 0804   HDL 44 09/03/2017 0804   CHOLHDL 2.6 09/03/2017 0804   VLDL 23 09/03/2017 0804   LDLCALC 47 09/03/2017 0804   HgbA1C  Lab Results  Component Value Date   HGBA1C 7.1 (H) 09/03/2017   Urinalysis    Component Value Date/Time   COLORURINE YELLOW 09/03/2017 0808   APPEARANCEUR CLEAR 09/03/2017 0808   LABSPEC 1.012 09/03/2017 0808   PHURINE 5.0 09/03/2017 0808   GLUCOSEU NEGATIVE 09/03/2017 0808   HGBUR SMALL (A) 09/03/2017 0808   BILIRUBINUR NEGATIVE 09/03/2017 0808   KETONESUR NEGATIVE 09/03/2017 0808   PROTEINUR 30 (A) 09/03/2017 0808   NITRITE NEGATIVE 09/03/2017 0808   LEUKOCYTESUR NEGATIVE 09/03/2017 0808   Urine Drug Screen     Component Value Date/Time   LABOPIA NONE DETECTED 09/03/2017 0808   COCAINSCRNUR NONE DETECTED 09/03/2017 0808   LABBENZ NONE DETECTED 09/03/2017 0808   AMPHETMU NONE DETECTED 09/03/2017 0808   THCU NONE DETECTED 09/03/2017 0808   LABBARB  NONE DETECTED 09/03/2017 0808    Alcohol Level    Component Value Date/Time   ETH <10 09/02/2017 1811     SIGNIFICANT DIAGNOSTIC STUDIES  Ct Head Code Stroke Wo Contrast Addendum Date: 09/02/2017   IMPRESSION: 1. Hyperdense 2.7 cm area of hemorrhage in the posterior left lentiform nuclei with surrounding edema. Mild regional mass effect with no midline shift. No intraventricular or extra-axial extension of hemorrhage.  2. Advanced bilateral cerebral white matter disease and small area of chronic cortical encephalomalacia in the posterior left MCA territory.  3. ASPECTS is not applicable, acute hemorrhage.  Echocardiogram:  Study Conclusions - Left ventricle: The cavity size was normal. Wall thickness was increased in a pattern of moderate LVH. Systolic function  was normal. The estimated ejection fraction was in the range of 60% to 65%. Wall motion was normal; there were no regional wall motion abnormalities. Doppler parameters are consistent with abnormal left ventricular relaxation (grade 1 diastolic dysfunction). The E/e&' ratio is between 8-15, suggesting indeterminate LV filling pressure. - Aortic valve: Mildly calcified leaflets. Mild stenosis. There was no regurgitation. Mean gradient (S): 12 mm Hg. Peak gradient (S): 22 mm Hg. Valve area (VTI): 1.64 cm^2. Valve area (Vmax): 1.63 cm^2. Valve area (Vmean): 1.74 cm^2. - Mitral valve: Mildly thickened leaflets . There was trivial regurgitation. - Left atrium: The atrium was normal in size. - Tricuspid valve: There was mild regurgitation. - Pulmonary arteries: PA peak pressure: 49 mm Hg (S). - Inferior vena cava: The vessel was normal in size. The respirophasic diameter changes were in the normal range (>= 50%), consistent with normal central venous pressure. Impressions: - LVEF 60-65%, moderate LVH, normal wall motion, grade 1 DD, indeterminate LV filling pressure, mild aortic stenosis - mean gradient 12 mmHg, AVA of 1.7 cm2, normal biatrial size, mild TR, RVSP 49 mmHg, normal IVC.  CTA Head/Neck:  No evidence of increased bleeding in the left basal ganglia. No evidence of increased mass effect.Aortic atherosclerosis. 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 Both carotid bifurcations are widely patent.No posterior circulation compromise  MRI Head:                                                           IMPRESSION: 1. Hemorrhagic left basal ganglia infarct. 2. Advanced chronic small vessel ischemic disease. 3. Chronic left parietal infarct.      HISTORY OF PRESENT ILLNESS Cindy Thornton is a 66 y.o. female with a history of hypertension who presented with  right-sided weakness that started earlier on the day of admission.  She was last known well around 1 PM.  Her niece left to go to an event and then around 3:30 PM, she talked to her son on the phone, but he could tell something was not right.  He then drove and found her on the floor, not responding very much.  EMS was therefore called and transported her as a code stroke.  LKW: Noon tpa given?: no, ICH ICH Score: 0    HOSPITAL COURSE ASSESSMENT: Cindy Thornton is a 66 y.o. female with PMH of HTN, HLD, DM admitted with complaints of right-sided weakness.  CT revealed basal ganglia hemorrhage possibly hypertensive in etiology. Due to the location, she was not a  surgical candidate. She was admitted to the intensive care unit for aggressive blood pressure management and close observation.  Left posterior lentiform nuclei -2.7 cm area of hemorrhage w/surrounding edema. Mild regional mass effect, no midline shift.   Suspected Etiology: unknown. Possibly hypertensive in etiology.   Resultant Symptoms: Right-sided weakness Stroke Risk Factors: diabetes mellitus, hyperlipidemia and hypertension Other Stroke Risk Factors: Advanced age,                                             09/03/17: Neuro exam remained stable.  Patient continues to have right-sided weakness.  Modified barium swallow in progress.  Case management consulted for insurance needs. Long discussion with son at bedside regarding plan of care and long-term prognosis  She has presented with right hemiplegia due to left subcortical hemorrhage etiology likely hypertensive though her blood pressure has not been significantly elevated. She remains at risk for neurological worsening and hematoma expansion. Recommend close neurological monitoring and tight blood pressure control. Check CT angiogram of the brain. Long discussion with the bedside with the patient's son and answered questions.   09/04/17: Patient awake and more  interactive with examiner today.  Neuro exam remained stable.  Continues to have right-sided weakness.  Passed a modified barium swallow.  CIR has been consulted and pending transfer tomorrow.  Echocardiogram performed yesterday and within normal limits.  MRI pending.  Home blood pressure medications restarted today and Cardene drip discontinued. Past speech swallow eval and now on dysphagia 2 diet thin liquids Will transfer to 3 W today  Agree with transfer patient to floor bed. Mobilize out of bed as tolerated. Therapy consults and transfer to rehabilitation over the next few days.  09/05/17: Neuro exam remained stable.  MRI shows stable hemorrhage in the left basal ganglia with no edema or midline shift.  Patient awake and following commands.  Continues to have moderate expressive aphasia.  Plan of care reviewed with son at bedside.  CIR meeting with family today, will transfer in AM.  PLAN  09/05/2017: CIR admission 09/06/2017 HOLD ASA for now Continue Atorvastatin Consult case management for insurance and placement needs Ongoing aggressive stroke risk factor management Follow up with [facility] in 6 weeks  DYSPHAGIA: Passed - Modified barium swallow 12/12 On dysphagia 2 diet with thin liquids Continue Aspiration  Precautions  Leukocytosis- Resolved Likely reactive, WBC 9.4 and remains afebrile at 98.6 today 12/14 UA negative Chest x-ray 12/12 - Right middle lobe atelectasis vs pneumonia No clinical signs of Pneumonia Incentive Spirometry every 1/hr while awake Will advise CIR to repeat CXR as recommended in 3 weeks  HYPERTENSION: Stable,some elevated B/P's  SBP goal less than 180/90  Long term BP goal normotensive. Restarted home B/P medications today 12/13, increased Lisinopril dose 12/14 Home Meds: Metoprolol, lisinopril-HCTZ  HYPERLIPIDEMIA: Labs(Brief)          Component Value Date/Time   CHOL 114 09/03/2017 0804   TRIG 113 09/03/2017 0804    HDL 44 09/03/2017 0804   CHOLHDL 2.6 09/03/2017 0804   VLDL 23 09/03/2017 0804   LDLCALC 47 09/03/2017 0804    Home Meds: Lipitor 20 LDL  goal < 70 Continued on Lipitor to 10 mg daily Continue statin at discharge  DIABETES: RecentLabs       Lab Results  Component Value Date   HGBA1C 7.1 (H) 09/03/2017  HgbA1c goal < 7.0 Currently on: NovoLog Continue CBG monitoring and SSI DM education   Other Active Problems: Active Problems:   Stroke (cerebrum) (HCC)   Leukocytosis   Dysphagia, post-stroke   Benign essential HTN   Diabetes mellitus type 2 in obese [facility]) Previous stroke by imaging  Hospital day # 3  VTE prophylaxis: SCD's  Diet : DIET DYS 2 Room service appropriate? Yes; Fluid consistency: Nectar Thick   Prior Home Stroke Medications: No antithrombotic      Disposition: Final discharge disposition not confirmed Therapy Recs:  Likely CIR Follow Recs:     Follow-up Information    Micki Riley, MD. Schedule an appointment as soon as possible for a visit in 6 week(s).   Specialties:  Neurology, Radiology Contact information: [address] [place] [address] [phone]       DISCHARGE EXAM Blood pressure 113/65, pulse 60, temperature 98 F (36.7 C), temperature source Oral, resp. rate 18, height 5\' 5"  (1.651 m), weight 156 lb 1.4 oz (70.8 kg), SpO2 93 %.   General - Well nourished, well developed, in no apparent distress Respiratory - Lungs clear bilaterally. No wheezing. Cardiovascular - Regular rate and rhythm  Neuro: Mental Status: Patient is awake, alert,she is able to tell me her name, but does not answer the month or her age, She is severely dysarthric. Cranial Nerves: II: Visual Fields are full. Pupils are equal, round, and reactive to light.  III,IV, VI:She crosses midline readily, but does appear to have a slight left gaze preference. V: Facial sensation isdiminished on the right VII: Facial  movement isnotable for right facial droop VIII: hearing is intact to voice XII: tonguedeviates to the right Motor: She has a severe right flaccid hemiparesis, 1/5 in the arm and leg. Purposeful antigravity movements on the left. Sensory: Sensation is severely reduced on the right Cerebellar: No clear ataxia when moving her left side    Discharge Diet  DIET DYS 2 Room service appropriate? Yes; Fluid consistency: Nectar Thick liquids  DISCHARGE PLAN  Disposition:  Transfer to [facility] for ongoing PT, OT and ST  No antithrombotic for secondary stroke prevention secondary to hemorrhage.  Recommend CIR repeat Head CT prior to discharge. If bleeding has been absorbed start ASA 81 mg daily at time of discharge from inpatient rehab.  Will advise CIR to repeat CXR as recommended in 3 weeks  Recommend ongoing risk factor control by Primary Care Physician at time of discharge from inpatient rehabilitation.  Follow-up No primary care provider on file. in 2 weeks following discharge from rehab.  Follow-up with Dr. Delia Heady, Stroke Clinic in 6 weeks, office to schedule an appointment.   40 minutes were spent preparing discharge.  Delton See PA-C [facility] Pager [phone] 09/06/2017, 9:41 AM  I reviewed above note and agree with the assessment and plan. I have made any additions or clarifications directly to the above note. Pt was seen and examined. Talked with pt through interpretor. Stressed about BP control and working hard with rehab. Still has right hemiparesis. Hold off ASA for now. Repeat CT head in 3-4 weeks. If blood absorbed or near absorbed, consider to resume ASA. Will follow up with Dr. Pearlean Brownie at [facility].   Marvel Plan, MD PhD Stroke Neurology 09/06/2017 11:07 PM

## 2017-09-07 ENCOUNTER — Inpatient Hospital Stay (HOSPITAL_COMMUNITY): Payer: Self-pay

## 2017-09-07 ENCOUNTER — Inpatient Hospital Stay (HOSPITAL_COMMUNITY): Payer: Self-pay | Admitting: Occupational Therapy

## 2017-09-07 DIAGNOSIS — I612 Nontraumatic intracerebral hemorrhage in hemisphere, unspecified: Secondary | ICD-10-CM

## 2017-09-07 DIAGNOSIS — E669 Obesity, unspecified: Secondary | ICD-10-CM

## 2017-09-07 DIAGNOSIS — E1169 Type 2 diabetes mellitus with other specified complication: Secondary | ICD-10-CM

## 2017-09-07 DIAGNOSIS — R1312 Dysphagia, oropharyngeal phase: Secondary | ICD-10-CM

## 2017-09-07 DIAGNOSIS — R609 Edema, unspecified: Secondary | ICD-10-CM

## 2017-09-07 LAB — CBC WITH DIFFERENTIAL/PLATELET
BASOS ABS: 0 10*3/uL (ref 0.0–0.1)
BASOS PCT: 0 %
EOS ABS: 0.3 10*3/uL (ref 0.0–0.7)
Eosinophils Relative: 4 %
HEMATOCRIT: 38.6 % (ref 36.0–46.0)
HEMOGLOBIN: 12.5 g/dL (ref 12.0–15.0)
Lymphocytes Relative: 38 %
Lymphs Abs: 3 10*3/uL (ref 0.7–4.0)
MCH: 28.1 pg (ref 26.0–34.0)
MCHC: 32.4 g/dL (ref 30.0–36.0)
MCV: 86.7 fL (ref 78.0–100.0)
MONOS PCT: 7 %
Monocytes Absolute: 0.6 10*3/uL (ref 0.1–1.0)
NEUTROS ABS: 4.1 10*3/uL (ref 1.7–7.7)
NEUTROS PCT: 51 %
Platelets: 198 10*3/uL (ref 150–400)
RBC: 4.45 MIL/uL (ref 3.87–5.11)
RDW: 13.2 % (ref 11.5–15.5)
WBC: 8.1 10*3/uL (ref 4.0–10.5)

## 2017-09-07 LAB — COMPREHENSIVE METABOLIC PANEL
ALBUMIN: 3.1 g/dL — AB (ref 3.5–5.0)
ALK PHOS: 67 U/L (ref 38–126)
ALT: 16 U/L (ref 14–54)
ANION GAP: 9 (ref 5–15)
AST: 20 U/L (ref 15–41)
BILIRUBIN TOTAL: 0.7 mg/dL (ref 0.3–1.2)
BUN: 19 mg/dL (ref 6–20)
CALCIUM: 9.2 mg/dL (ref 8.9–10.3)
CO2: 29 mmol/L (ref 22–32)
Chloride: 102 mmol/L (ref 101–111)
Creatinine, Ser: 0.81 mg/dL (ref 0.44–1.00)
GFR calc Af Amer: >60 mL/min (ref >60)
GFR calc non Af Amer: >60 mL/min (ref >60)
GLUCOSE: 121 mg/dL — AB (ref 65–99)
Potassium: 3.7 mmol/L (ref 3.5–5.1)
SODIUM: 140 mmol/L (ref 135–145)
TOTAL PROTEIN: 6.7 g/dL (ref 6.5–8.1)

## 2017-09-07 LAB — GLUCOSE, CAPILLARY
GLUCOSE-CAPILLARY: 135 mg/dL — AB (ref 65–99)
Glucose-Capillary: 125 mg/dL — ABNORMAL HIGH (ref 65–99)
Glucose-Capillary: 126 mg/dL — ABNORMAL HIGH (ref 65–99)
Glucose-Capillary: 134 mg/dL — ABNORMAL HIGH (ref 65–99)

## 2017-09-07 NOTE — Progress Notes (Signed)
[place] PHYSICAL MEDICINE & REHABILITATION     PROGRESS NOTE    Subjective/Complaints: No complaints.  Had a good night per staff  ROS: limited due to language/communication   Objective: Vital Signs: Blood pressure (!) 150/56, pulse 79, temperature 98.2 F (36.8 C), temperature source Oral, resp. rate 16, SpO2 96 %. Dg Chest 2 View  Result Date: 09/06/2017 CLINICAL DATA:  Follow-up right middle lobe atelectasis or pneumonia. EXAM: CHEST  2 VIEW COMPARISON:  AP radiograph 09/03/2017 FINDINGS: Previous right middle lobe opacity has resolved. Upper normal heart size with atherosclerotic aorta. No pulmonary edema. No consolidation, pleural effusion, or pneumothorax. No acute osseous abnormalities are seen. IMPRESSION: Resolved right middle lobe opacity.  No new abnormality. Electronically Signed   By: Rubye OaksMelanie  Ehinger M.D.   On: 09/06/2017 18:59   Recent Labs    09/05/17 0408 09/07/17 0509  WBC 9.4 8.1  HGB 12.9 12.5  HCT 39.1 38.6  PLT 196 198   Recent Labs    09/05/17 0408 09/07/17 0509  NA 139 140  K 4.0 3.7  CL 105 102  GLUCOSE 124* 121*  BUN 16 19  CREATININE 0.77 0.81  CALCIUM 9.3 9.2   CBG (last 3)  Recent Labs    09/06/17 1608 09/06/17 2103 09/07/17 0631  GLUCAP 115* 167* 125*    Wt Readings from Last 3 Encounters:  09/02/17 70.8 kg (156 lb 1.4 oz)    Physical Exam:  Constitutional: She appears well-developed and well-nourished. No distress.  HENT:  Head: Normocephalic and atraumatic.  Mouth/Throat: Oropharynx is clear and moist.  Eyes: Conjunctivae are normal. Pupils are equal, round, and reactive to light.  Neck: Normal range of motion. Neck supple.  Cardiovascular: RRR without murmur. No JVD  Respiratory: CTA Bilaterally without wheezes or rales. Normal effort .  GI: Soft. Bowel sounds are normal. She exhibits no distension. There is no tenderness.  Musculoskeletal: She exhibits no edema or tenderness.  Neurological: She is alert.  Right  facial weakness with right inattention.    Speech dysarthric    Easily distracted and needs redirection.  Had difficulty following simple motor command without verbal and tactile cues.  Right upper extremity is 0 out of 5 proximal to distal.  Right lower extremity is 2-3 out of 5 proximal to distal with inconsistent effort.  Left upper and left lower extremity grossly 4-5 out of 5.  Patient senses gross touch and pain in right arm and leg.    Skin: Skin is warm and dry. She is not diaphoretic.  Psychiatric: Generally pleasant but can be inattentive and delayed.     Assessment/Plan: 1.  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 Physiatrist is providing close team supervision and 24 hour management of active medical problems listed below. Physiatrist and rehab team continue to assess barriers to discharge/monitor patient progress toward functional and medical goals.  Function:  Bathing Bathing position      Bathing parts      Bathing assist        Upper Body Dressing/Undressing Upper body dressing                    Upper body assist        Lower Body Dressing/Undressing Lower body dressing  Lower body assist        Toileting Toileting          Toileting assist     Transfers Chair/bed Optician, dispensingtransfer             Locomotion Ambulation           Wheelchair          Cognition Comprehension    Expression    Social Interaction    Problem Solving    Memory     Medical Problem List and Plan: 1.  Functional, cognitive and mobility deficits  secondary to hemorrhagic left basal ganglia infarct              -Beginning therapies today 2.  DVT Prophylaxis/Anticoagulation: Mechanical: Sequential compression devices, below knee Bilateral lower extremities 3. Pain Management:  N/A 4. Mood: LCSW to follow for evaluation and support as mentation improves.  5. Neuropsych: This patient is not capable of making decisions on her own behalf. 6. Skin/Wound Care: routine pressure relief measures.  7. Fluids/Electrolytes/Nutrition: Encourage oral intake.   -All labs reviewed and essentially within normal limits today 8. HTN: Monitor BP qid. On lisinopril, HCTZ and metoprolol. Monitor renal status while on nectars.   -Fair control at present 9. Leukocytosis: Question due to RML aspiration PNA --being monitored and has resolved.     -Area in the right middle lobe has resolved    -White blood cell count normal.  Patient is afebrile 10 Dysphagia: Continue dysphagia 2, nectar liquids.Monitor hydration status with routine checks.  11. Dyslipidemia: On Lipitor  12. T2DM: Hgb A1C-7.1.  Monitor BS ac/hs. Resume metformin.    -Reasonable control CBG (last 3)  Recent Labs    09/06/17 1608 09/06/17 2103 09/07/17 0631  GLUCAP 115* 167* 125*      LOS (Days) 1 A FACE TO FACE EVALUATION WAS PERFORMED  Ranelle OysterSWARTZ,ZACHARY T, MD 09/07/2017 9:24 AM

## 2017-09-07 NOTE — Evaluation (Signed)
Occupational Therapy Assessment and Plan  Patient Details  Name: Cindy Thornton MRN: [id number] Date of Birth: 04-Jul-1951  OT Diagnosis: abnormal posture, cognitive deficits, disturbance of vision, hemiplegia affecting dominant side, muscle weakness (generalized) and coordination disorder Rehab Potential: Rehab Potential (ACUTE ONLY): Good ELOS: 3.5-4 weeks   Today's Date: 09/07/2017 OT Individual Time: 2353-6144 and 3154-0086 OT Individual Time Calculation (min): 70 min  And 74 min  Problem List:  Patient Active Problem List   Diagnosis Date Noted  . ICH (intracerebral hemorrhage) ([place]) 09/06/2017  . Leukocytosis   . Dysphagia, post-stroke   . Benign essential HTN   . Diabetes mellitus type 2 in obese ([place])   . 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 Diabetes mellitus without complication ([place])   . Hyperlipidemia   . Hypertension    Past Surgical History: History reviewed. No pertinent surgical history.  Assessment & Plan Clinical Impression: Cindy Thornton a 66 y.o.femalewith history of T2DM, HTN who was admitted on 09/02/17 after being found by family with left sided weakness, right facial droop and difficulty speaking.History taken from chart review and son.CT headreviewed, showing left basal ganglia hemorrhage  with surrounding edema and mild regional mass effect. Dr. Leonie Man felt to be hypertensive in nature and she was started on nicardipine drip. MRI brain done revealing hemorrhage in left basal ganglia infarct, advanced chronic small vessel disease and chronic left parietal infarct. 2D echo showed EF 60-65% with moderate LVH, mild aortic stenosis and trivial MVR. CTA head/neck done today revealing no evidence of increase in bleeding, aortic atherosclerosis and 20% stenosis of proximal L-SA.  MBS done 12/12 and she was placed on dysphagia 1, nectar liquids. Mentation improving but verbal output with  gibberish and severe dysarthria.  Patient with resultant right sided weakness with sensory deficits as well as severe dysarthria with minimal breath support--question aphasia. CIR recommended by therapy team due to functional decline.  Patient currently requires total with basic self-care skills secondary to muscle weakness and muscle paralysis, decreased cardiorespiratoy endurance, impaired timing and sequencing, abnormal tone, unbalanced muscle activation, decreased coordination and decreased motor planning, field cut, decreased midline orientation, decreased attention to right, right side neglect and decreased motor planning, decreased initiation, decreased attention, decreased awareness, decreased problem solving, decreased safety awareness, decreased memory and delayed processing and decreased sitting balance, decreased standing balance, decreased postural control, hemiplegia and decreased balance strategies.  Prior to hospitalization, patient could complete BADLs with independent .  Patient will benefit from skilled intervention to increase independence with basic self-care skills prior to discharge TBD by family within the next few days.  Anticipate patient will require minimal physical assistance 24/7 supervision and follow up home health.  OT - End of Session Endurance Deficit: Yes OT Assessment Rehab Potential (ACUTE ONLY): Good OT Barriers to Discharge: Decreased caregiver support;Home environment access/layout OT Barriers to Discharge Comments: Finding suitable d/c environment  OT Patient demonstrates impairments in the following area(s): Balance;Perception;Cognition;Safety;Sensory;Endurance;Skin Integrity;Vision;Motor OT Basic ADL's Functional Problem(s): Grooming;Bathing;Dressing;Toileting OT Transfers Functional Problem(s): Toilet;Tub/Shower OT Additional Impairment(s): Fuctional Use of Upper Extremity OT Plan OT Intensity: Minimum of 1-2 x/day, 45 to 90 minutes OT Frequency: 5 out  of 7 days OT Duration/Estimated Length of Stay: 3.5-4 weeks OT Treatment/Interventions: Balance/vestibular training;Discharge planning;Functional electrical stimulation;Pain management;Self Care/advanced ADL retraining;UE/LE Coordination activities;Therapeutic Activities;Cognitive remediation/compensation;Disease mangement/prevention;Functional mobility training;Patient/family education;Skin care/wound managment;Therapeutic Exercise;Visual/perceptual remediation/compensation;Community reintegration;Neuromuscular re-education;DME/adaptive equipment instruction;Psychosocial support;Splinting/orthotics;UE/LE Strength taining/ROM;Wheelchair propulsion/positioning OT Self Feeding Anticipated Outcome(s):  No goal OT Basic Self-Care Anticipated Outcome(s): Supervision-Min A OT Toileting Anticipated Outcome(s): Min A OT Bathroom Transfers Anticipated Outcome(s): Min A OT Recommendation Recommendations for Other Services: Therapeutic Recreation consult Patient destination: Home Follow Up Recommendations: Home health OT;24 hour supervision/assistance Equipment Recommended: To be determined Equipment Details: Depending on d/c location Skilled Therapeutic Intervention Skilled OT session completed with focus on initial evaluation, education on OT role/POC, and establishment of patient-centered goals.   Pt greeted supine in bed with RN present. She was finishing up eating breakfast, using Lt hand. Afterwards pt transitioned to EOB with Mod A and cues to attend to Rt side. Pt bathing with step by step cues for sequencing. She did initiate washing of R UE but required assist for extremity mgt due to flaccidity. HOH for integrating affected limb into bathing tasks. Pt able to achieve sit<stand with Max A (due to bed being too high). Total A for dynamic balance while she completed pericare. OT supported pt on Rt side to minimize knee buckling. Due to height of bed and pts short stature, she was not able to sit securely  EOB after stand<sit. Total A for sit<supine and then transitioned back into sitting EOB to thread LB garments. LB dressing completed withTotal A, lifting pants over hips via rolling Rt>Lt in bed with Mod A. At end of tx pt was repositioned to protect hemiplegic side and left with all needs within reach and bed alarm activated.   Interpretor present during session    2nd Session 1:1 tx (74 min) Tx focus on functional transfers, balance, and Lt NMR/attention during self care tasks.   Pt greeted supine in bed with granddaughters present. Discussed d/c plans with granddaughters who reported they do not know where pt will be d/c to yet (though she will be with family). Discussed with family pts OT goals, ELOS, and CIR process. Afterwards had pt practice toilet transfers with and without Stedy. Pt unsafe to complete stand pivots to toilet at this time due to Rt knee instability, though we tried several times and had to return to w/c. Pt ultimately successful with Stedy. She was incontinent of B+B in underwear, continued voiding B+B while on toilet. 2 helpers for hygiene/brief application. Pt able to maintain standing ~5 minutes with cues for upright neutral posture due to Rt lean when fatigued. Afterwards pt donned clean hospital gown, assisting with threading both arms. Oral care/grooming tasks completed w/c level at sink with pt exhibiting deficits with Rt attention, sequencing, and motor planning. She was able to use ADL items correctly when presented with them. Discussed with family self care activities that they could assist with in room for Rt NMR/attention. Also advised to converse with pt on Rt side. Family very receptive to information and asking appropriate questions regarding CVA recovery. At end of tx pt was left in w/c with granddaughters. Adaptive footstool used to prop up her feet due to lack of suitable leg rests for her small w/c.   Interpretor present during tx   OT  Evaluation Precautions/Restrictions  Precautions Precautions: Fall Precaution Comments: Rt hemi, Rt inattention, interpretor needs (Spanish)  Restrictions Weight Bearing Restrictions: No General Chart Reviewed: Yes Family/Caregiver Present: Yes(granddaughters) Vital Signs  Pain No c/o pain during tx    Home Living/Prior Functioning Home Living Family/patient expects to be discharged to: TBD. Family have yet to solidify. Per granddaughter, pt cannot return home with her  IADL History Homemaking Responsibilities: No Type of Occupation: Pt unable to provide information when asked Leisure  and Hobbies: Pt unable to provide information when asked IADL Comments: Per chart, pt was living with granddaughter and assisting with taking care of great-grandchildren Prior Function Level of Independence: Independent with basic ADLs, Independent with homemaking with ambulation, Independent with gait  Able to Take Stairs?: Yes Driving: No  ADL ADL ADL Comments: Please see functional navigator for ADL status Vision Vision Assessment?: Vision impaired- to be further tested in functional context Additional Comments: Pt reports visual disturbance/bluriness with Rt eye. Unable to read large print on walls or wall clock with Lt eye occluded  Perception  Perception: Impaired Inattention/Neglect: Does not attend to right side of body;Does not attend to right visual field(during functional tasks) Praxis Praxis: Impaired Praxis Impairment Details: Motor planning Praxis-Other Comments: during functional tasks Cognition Overall Cognitive Status: Impaired/Different from baseline Arousal/Alertness: Awake/alert Orientation Level: Person Year: (Pt unintelligible ) Month: (Pt unintelligible ) Day of Week: (Pt unintelligible ) Memory: Impaired Immediate Memory Recall: (0/3) Memory Recall: (0/3) Attention: Sustained Sustained Attention: Appears intact Selective Attention: Appears intact Awareness:  Impaired Problem Solving: Impaired Safety/Judgment: Appears intact Sensation Sensation Light Touch: Impaired Detail Light Touch Impaired Details: Impaired RLE;Impaired RUE (difficult to assess due to cognition. She did report her Rt side felt "asleep") Stereognosis: Not tested Hot/Cold: Not tested Proprioception: Impaired Detail Proprioception Impaired Details: Impaired RUE;Impaired RLE Coordination Gross Motor Movements are Fluid and Coordinated: No Fine Motor Movements are Fluid and Coordinated: No Coordination and Movement Description: Rt hemiparesis UE>LE Motor  Motor Motor: Abnormal tone;Hemiplegia Motor - Skilled Clinical Observations: R hemiparesis Mobility  Transfers Transfers: Sit to Stand;Stand to Sit Sit to Stand: 1: +1 Total assist;From toilet(with Stedy) Stand to Sit: 1: +1 Total assist;To toilet(with Stedy)   Trunk/Postural Assessment  Cervical Assessment Cervical Assessment: Within Functional Limits Thoracic Assessment Thoracic Assessment: Exceptions to WFL(Rt lean in standing/sitting ) Lumbar Assessment Lumbar Assessment: Within Functional Limits Postural Control Postural Control: Deficits on evaluation Balance Balance Balance Assessed: Yes Static Sitting Balance Static Sitting - Level of Assistance: 5: Stand by assistance Dynamic Sitting Balance Dynamic Sitting - Level of Assistance: 4: Min assist;3: Mod assist Sitting balance - Comments: Pt threading LEs into skirt EOB and losing balance, required Mod A to recover Dynamic Standing Balance Dynamic Standing - Level of Assistance: 1: +1 Total assist;1: +2 Total assist(LB self care + toileting) Extremity/Trunk Assessment RUE Assessment RUE Assessment: Exceptions to WFL(Brunnstrom Stage 1) LUE Assessment LUE Assessment: Within Functional Limits   See Function Navigator for Current Functional Status.   Refer to Care Plan for Long Term Goals  Recommendations for other services: Therapeutic Recreation   Pet therapy  Discharge Criteria: Patient will be discharged from OT if patient refuses treatment 3 consecutive times without medical reason, if treatment goals not met, if there is a change in medical status, if patient makes no progress towards goals or if patient is discharged from hospital.  The above assessment, treatment plan, treatment alternatives and goals were discussed and mutually agreed upon: by patient and by family  Skeet Simmer 09/07/2017, 4:41 PM

## 2017-09-07 NOTE — Progress Notes (Signed)
VASCULAR LAB PRELIMINARY  PRELIMINARY  PRELIMINARY  PRELIMINARY  Bilateral lower extremity venous duplex completed.    Preliminary report:  There is no DVT or SVT noted in the bilateral lower extremities.   Babita Amaker, RVT 09/07/2017, 11:52 AM

## 2017-09-07 NOTE — Evaluation (Signed)
Physical Therapy Assessment and Plan  Patient Details  Name: Cindy Thornton MRN: [id number] Date of Birth: 1951/04/21  PT Diagnosis: Difficulty walking, Hemiparesis dominant and Muscle weakness Rehab Potential: Good ELOS: 3 weeks   Today's Date: 09/07/2017 PT Individual Time: 1000-1058 PT Individual Time Calculation (min): 58 min    Problem List:  Patient Active Problem List   Diagnosis Date Noted  . ICH (intracerebral hemorrhage) ([place]) 09/06/2017  . Leukocytosis   . Dysphagia, post-stroke   . Benign essential HTN   . Diabetes mellitus type 2 in obese ([place])   . 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 Diabetes mellitus without complication ([place])   . Hyperlipidemia   . Hypertension    Past Surgical History: History reviewed. No pertinent surgical history.  Assessment & Plan Clinical Impression: Patient is a 66 y.o. year old female with history of T2DM, HTN who was admitted on 09/02/17 after being found by family with left sided weakness, right facial droop and difficulty speaking.History taken from chart review and son.CT headreviewed, showing left basal ganglia hemorrhage  with surrounding edema and mild regional mass effect. Dr. Leonie Man felt to be hypertensive in nature and she was started on nicardipine drip. MRI brain done revealing hemorrhage in left basal ganglia infarct, advanced chronic small vessel disease and chronic left parietal infarct. 2D echo showed EF 60-65% with moderate LVH, mild aortic stenosis and trivial MVR. CTA head/neck done today revealing no evidence of increase in bleeding, aortic atherosclerosis and 20% stenosis of proximal L-SA.  MBS done 12/12 and she was placed on dysphagia 1, nectar liquids. Mentation improving but verbal output with gibberish and severe dysarthria.  Patient with resultant right sided weakness with sensory deficits as well as severe dysarthria with minimal breath  support--question aphasia. CIR recommended by therapy team due to functional decline. Patient transferred to CIR on 09/06/2017 .   Patient currently requires max with mobility secondary to muscle weakness, impaired timing and sequencing, decreased coordination and decreased motor planning and decreased standing balance, decreased postural control, hemiplegia and decreased balance strategies.  Prior to hospitalization, patient was independent  with mobility and lived with Family in a   home.  Home access is 4Stairs to enter.  Patient will benefit from skilled PT intervention to maximize safe functional mobility, minimize fall risk and decrease caregiver burden for planned discharge home with 24 hour supervision.  Anticipate patient will benefit from follow up [place] at discharge.  PT - End of Session Activity Tolerance: Tolerates 30+ min activity with multiple rests Endurance Deficit: Yes PT Assessment Rehab Potential (ACUTE/IP ONLY): Good PT Barriers to Discharge: Decreased caregiver support;Home environment access/layout PT Barriers to Discharge Comments: pt was living with granddaughter states she will not return there after d/c from hospital PT Patient demonstrates impairments in the following area(s): Balance;Sensory;Behavior;Endurance;Motor;Nutrition;Safety PT Transfers Functional Problem(s): Bed Mobility;Bed to Chair;Car;Furniture PT Locomotion Functional Problem(s): Ambulation;Wheelchair Mobility;Stairs PT Plan PT Intensity: Minimum of 1-2 x/day ,45 to 90 minutes PT Frequency: 5 out of 7 days PT Duration Estimated Length of Stay: 3 weeks PT Treatment/Interventions: Ambulation/gait training;Discharge planning;Functional mobility training;Therapeutic Activities;Psychosocial support;Visual/perceptual remediation/compensation;Balance/vestibular training;Disease management/prevention;Neuromuscular re-education;Therapeutic Exercise;Wheelchair propulsion/positioning;Cognitive  remediation/compensation;DME/adaptive equipment instruction;Pain management;UE/LE Strength taining/ROM;Splinting/orthotics;Stair training;UE/LE Coordination activities;Patient/family education;Functional electrical stimulation;Community reintegration PT Transfers Anticipated Outcome(s): min assist PT Locomotion Anticipated Outcome(s): min assist PT Recommendation Follow Up Recommendations: Home health PT Patient destination: Home Equipment Recommended: To be determined  Skilled Therapeutic Intervention Evaluation completed (see details  above and below) with education on PT POC and goals and individual treatment initiated with focus on functional transfers, gait and education regarding pts current functional abilities. Interpreter present for entire session. Pt supine in bed upon PT arrival, agreeable to therapy tx and denies pain. Pt transferred from supine<>sitting with min assist. Pt transferred from bed>w/c stand pivot with mod assist, R knee buckling. Pt unable to propel w/c secondary to her height despite being in an ultra hemi height chair, total assist for w/c. Pt transported to the gym. Pt ambulated x 30 ft with L handrail and max assist, verbal cues for R knee extension during stance however pt continues to buckle and requiring max assist for R knee blocking. Pt performed car transfer stand pivot with mod assist. Pt ascended/descended 1 step with max assist and using L handrail. Pt left seated in w/c at end of session with needs in reach.    PT Evaluation Precautions/Restrictions Precautions Precautions: Fall Restrictions Weight Bearing Restrictions: No  Pain   denies pain Home Living/Prior Functioning Home Living Available Help at Discharge: Family;Available PRN/intermittently Home Access: Stairs to enter Entrance Stairs-Number of Steps: 4 Entrance Stairs-Rails: Can reach both;Right;Left Home Layout: One level Additional Comments: pt lives with granddaughter and is indep with  mobiltiy and ADLs, pt watches children in the home  Lives With: Family Prior Function Level of Independence: Independent with basic ADLs;Independent with homemaking with ambulation;Independent with gait  Able to Take Stairs?: Yes Driving: No Comments: Pt living with granddaughter prior to CVA and watched her children during the day, unsure where pt will d/c from here.  Cognition Overall Cognitive Status: Impaired/Different from baseline Arousal/Alertness: Awake/alert Orientation Level: Disoriented to person;Disoriented to place;Disoriented to situation Sustained Attention: Appears intact Selective Attention: Appears intact Memory: Appears intact Awareness: Impaired Problem Solving: Appears intact Sensation Sensation Light Touch: Appears Intact Proprioception: Appears Intact Additional Comments: sensation appears WFL B LEs Coordination Gross Motor Movements are Fluid and Coordinated: No Fine Motor Movements are Fluid and Coordinated: No Coordination and Movement Description: R hemiparesis, UE>LE Motor  Motor Motor: Abnormal tone;Hemiplegia Motor - Skilled Clinical Observations: R hemiparesis  Trunk/Postural Assessment  Cervical Assessment Cervical Assessment: Within Functional Limits Thoracic Assessment Thoracic Assessment: Exceptions to WFL(kyphotic) Lumbar Assessment Lumbar Assessment: Within Functional Limits Postural Control Postural Control: Deficits on evaluation Protective Responses: poor  Balance Balance Balance Assessed: Yes Static Sitting Balance Static Sitting - Level of Assistance: 5: Stand by assistance Dynamic Sitting Balance Dynamic Sitting - Level of Assistance: 4: Min assist Static Standing Balance Static Standing - Level of Assistance: 3: Mod assist Dynamic Standing Balance Dynamic Standing - Level of Assistance: 2: Max assist Extremity Assessment  RLE Assessment RLE Assessment: Exceptions to [facility] RLE Strength Right Hip Flexion: 3-/5 Right Hip  Extension: 3-/5 Right Knee Flexion: 2+/5 Right Knee Extension: 3-/5 Right Ankle Dorsiflexion: 1/5 Right Ankle Plantar Flexion: 0/5 LLE Assessment LLE Assessment: Within Functional Limits   See Function Navigator for Current Functional Status.   Refer to Care Plan for Long Term Goals  Recommendations for other services: None   Discharge Criteria: Patient will be discharged from PT if patient refuses treatment 3 consecutive times without medical reason, if treatment goals not met, if there is a change in medical status, if patient makes no progress towards goals or if patient is discharged from hospital.  The above assessment, treatment plan, treatment alternatives and goals were discussed and mutually agreed upon: by patient  Netta Corrigan, PT, DPT 09/07/2017, 1:03 PM

## 2017-09-07 NOTE — Plan of Care (Signed)
LTGs established 09/07/17

## 2017-09-08 ENCOUNTER — Inpatient Hospital Stay (HOSPITAL_COMMUNITY): Payer: Self-pay | Admitting: Physical Therapy

## 2017-09-08 ENCOUNTER — Inpatient Hospital Stay (HOSPITAL_COMMUNITY): Payer: Self-pay | Admitting: Speech Pathology

## 2017-09-08 ENCOUNTER — Inpatient Hospital Stay (HOSPITAL_COMMUNITY): Payer: Self-pay | Admitting: Occupational Therapy

## 2017-09-08 DIAGNOSIS — I69131 Monoplegia of upper limb following nontraumatic intracerebral hemorrhage affecting right dominant side: Secondary | ICD-10-CM

## 2017-09-08 DIAGNOSIS — E119 Type 2 diabetes mellitus without complications: Secondary | ICD-10-CM

## 2017-09-08 DIAGNOSIS — I69391 Dysphagia following cerebral infarction: Secondary | ICD-10-CM

## 2017-09-08 DIAGNOSIS — G8191 Hemiplegia, unspecified affecting right dominant side: Secondary | ICD-10-CM

## 2017-09-08 LAB — GLUCOSE, CAPILLARY
GLUCOSE-CAPILLARY: 117 mg/dL — AB (ref 65–99)
GLUCOSE-CAPILLARY: 111 mg/dL — AB (ref 65–99)
Glucose-Capillary: 114 mg/dL — ABNORMAL HIGH (ref 65–99)
Glucose-Capillary: 161 mg/dL — ABNORMAL HIGH (ref 65–99)

## 2017-09-08 NOTE — Progress Notes (Signed)
Orthopedic Tech Progress Note Patient Details:  Cindy Thornton 05/26/1951 [id number]  Patient ID: Cindy Thornton, female   DOB: 05/26/1951, 66 y.o.   MRN: [id number]   Nikki DomCrawford, [place] Gaut 09/08/2017, 11:43 AM Called in [facility]anger brace order; [facility]

## 2017-09-08 NOTE — Progress Notes (Signed)
Social Work  Social Work Assessment and Plan  Patient Details  Name: Cindy Thornton MRN: [id number] Date of Birth: 1951/05/04  Today's Date: 09/08/2017  Problem List:  Patient Active Problem List   Diagnosis Date Noted  . Diabetes mellitus type 2 in nonobese (HCC)   . Right hemiparesis (HCC)   . 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 ICH (intracerebral hemorrhage) (HCC) 09/06/2017  . Leukocytosis   . Dysphagia, post-stroke   . Benign essential HTN   . Diabetes mellitus type 2 in obese (HCC)   . 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 Diabetes mellitus without complication (HCC)   . Hyperlipidemia   . Hypertension    Past Surgical History: History reviewed. No pertinent surgical history. Social History:  reports that  has never smoked. she has never used smokeless tobacco. She reports that she does not drink alcohol or use drugs.  Family / Support Systems Marital Status: Widow/Widower How Long?: 30 yrs Patient Roles: Parent, Other (Comment)(grandparent) Children: son, Cindy Thornton (pt lives with) @ (C) [phone];  son, Cindy Thornton @ (C) [phone];  daughter, Cindy Thornton Administrator, arts([place]) and daughter, Cindy Thornton (siler [place]) Cindy Thornton, reports that pt will d/c to her home and she will be primary caregiver. Ability/Limitations of Caregiver: None Caregiver Availability: 24/7 Family Dynamics: Family very attentive and supportive to patient and are committed to providing 24/7 care as long as needed.  Social History Preferred language: English Religion:  Cultural Background: Pt originally from [place] and has been in the [place] x 20 yrs Education: Pt reports she did not attend school in [place]. Read: Yes(spanish) Write: Yes Employment Status: Retired Fish farm managerLegal Hisotry/Current Legal Issues: None Guardian/Conservator: None - per  MD, pt is not capable of making decisions on her own behalf - defer to daughter/ family   Abuse/Neglect Abuse/Neglect Assessment Can Be Completed: Yes Physical Abuse: Denies Verbal Abuse: Denies Sexual Abuse: Denies Exploitation of patient/patient's resources: Denies Self-Neglect: Denies  Emotional Status Pt's affect, behavior adn adjustment status: Pt very pleasant, frail appearing woman who is able to complete the assessment interview with little difficulty (interpreter assisted), however, daughter did assist at times.  She does not report any s/s of emotional distress about her CVA and hopeful she will make a good recovery.  Most concerned about her arm and hand.  Will monitor and refer for neuropsychology as indicated. Recent Psychosocial Issues: None Pyschiatric History: None Substance Abuse History: None  Patient / Family Perceptions, Expectations & Goals Pt/Family understanding of illness & functional limitations: Pt and family with good understanding of her stroke and resulting functional limitations/ need for CIR. Premorbid pt/family roles/activities: Pt was fully independent and helping to care for grandchildren. Cindy changes in roles/activities/participation: Daughter, Cindy Thornton to assume primary caregiver role. Pt/family expectations/goals: As noted, pt hopeful for function to return and worried about her hand.  Community Resources [employer] Agencies: None Premorbid Home Care/DME Agencies: None Transportation available at discharge: yes Resource referrals recommended: Neuropsychology, Support group (specify)  Discharge Planning Living Arrangements: Children Support Systems: Children Type of Residence: Private residence Insurance Resources: Customer service managerelf-pay Financial Resources: Family Support Financial Screen Referred: Previously completed Living Expenses: Lives with family Money Management: Family Does the patient have any problems obtaining your medications?: No(is  able [place] get medications from [place] Home Management: Pt and family Patient/Family Preliminary Plans: Pt to d/c home with daughter who will provide  24/7 assistance. Social Work Cindy Follow Up Needs: HH/OP Expected length of stay: 21 days  Clinical Impression Unfortunate woman here following a CVA (her 2nd) and with significant right hemiparesis.  Good family support and daughter reports she will be the primary caregiver.  Using interpreter for therapies as pt will little english skills.  Pt and family deny any emotional distress, however, will monitor and refer for neuropsychology as indicated.  Will follow for support and d/c planning needs.  Cindy Thornton 09/08/2017, 1:55 PM

## 2017-09-08 NOTE — Progress Notes (Signed)
Cindy Thornton PHYSICAL MEDICINE & REHABILITATION     PROGRESS NOTE    Subjective/Complaints: Pt seen laying in bed this AM.  She indicates she slept well overnight. Interaction limited by language.   ROS: Limited due to language.  Objective: Vital Signs: Blood pressure 130/70, pulse 74, temperature 97.9 F (36.6 C), temperature source Oral, resp. rate 16, height 5' (1.524 m), weight 69.6 kg (153 lb 7 oz), SpO2 100 %. Dg Chest 2 View  Result Date: 09/06/2017 CLINICAL DATA:  Follow-up right middle lobe atelectasis or pneumonia. EXAM: CHEST  2 VIEW COMPARISON:  AP radiograph 09/03/2017 FINDINGS: Previous right middle lobe opacity has resolved. Upper normal heart size with atherosclerotic aorta. No pulmonary edema. No consolidation, pleural effusion, or pneumothorax. No acute osseous abnormalities are seen. IMPRESSION: Resolved right middle lobe opacity.  No new abnormality. Electronically Signed   By: Rubye OaksMelanie  Ehinger M.D.   On: 09/06/2017 18:59   Recent Labs    09/07/17 0509  WBC 8.1  HGB 12.5  HCT 38.6  PLT 198   Recent Labs    09/07/17 0509  NA 140  K 3.7  CL 102  GLUCOSE 121*  BUN 19  CREATININE 0.81  CALCIUM 9.2   CBG (last 3)  Recent Labs    09/07/17 1645 09/07/17 2122 09/08/17 0651  GLUCAP 126* 134* 117*    Wt Readings from Last 3 Encounters:  09/06/17 69.6 kg (153 lb 7 oz)  09/02/17 70.8 kg (156 lb 1.4 oz)    Physical Exam:  Constitutional: She appears well-developed and well-nourished. No distress.  HENT: Normocephalic and atraumatic.  Eyes: EOMI. No discharge.  Cardiovascular: RRR. No JVD  Respiratory: CTA Bilaterally. Normal effort.  GI: Bowel sounds are normal. She exhibits no distension.  Musculoskeletal: She exhibits no edema or tenderness.  Neurological: She is alert.  Right facial weakness with right inattention.     Speech dysarthric     Had difficulty following simple motor commands (?language) Motor: Right upper extremity 0/5 proximal to  distal.   Right lower extremity: ?3/5 proximal to distal  Left upper/left lower extremity:  Grossly 5/5.   Skin: Skin is warm and dry. She is not diaphoretic.  Psychiatric: Limited due to language  Assessment/Plan: 1.  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 Physiatrist is providing close team supervision and 24 hour management of active medical problems listed below. Physiatrist and rehab team continue to assess barriers to discharge/monitor patient progress toward functional and medical goals.  Function:  Bathing Bathing position   Position: Sitting EOB  Bathing parts Body parts bathed by patient: Chest, Abdomen, Front perineal area, Buttocks Body parts bathed by helper: Right arm, Left arm, Right upper leg, Left upper leg, Right lower leg, Left lower leg, Back  Bathing assist        Upper Body Dressing/Undressing Upper body dressing   What is the patient wearing?: Pull over shirt/dress     Pull over shirt/dress - Perfomed by patient: Thread/unthread left sleeve, Put head through opening Pull over shirt/dress - Perfomed by helper: Thread/unthread right sleeve, Pull shirt over trunk        Upper body assist Assist Level: (Mod A)      Lower Body Dressing/Undressing Lower body dressing   What is the patient wearing?: Pants, Ted Hose, Non-skid slipper socks(skirt=pants)       Pants- Performed by helper: Thread/unthread right pants  leg, Thread/unthread left pants leg, Pull pants up/down   Non-skid slipper socks- Performed by helper: Don/doff right sock, Don/doff left sock               TED Hose - Performed by helper: Don/doff right TED hose, Don/doff left TED hose  Lower body assist Assist for lower body dressing: (Total A)      Toileting Toileting     Toileting steps completed by helper: Adjust clothing prior to  toileting, Performs perineal hygiene, Adjust clothing after toileting    Toileting assist Assist level: Two helpers   Transfers Chair/bed transfer   Chair/bed transfer method: Stand pivot Chair/bed transfer assist level: Moderate assist (Pt 50 - 74%/lift or lower) Chair/bed transfer assistive device: Armrests     Locomotion Ambulation     Max distance: 30 ft Assist level: Maximal assist (Pt 25 - 49%)   Wheelchair Wheelchair activity did not occur: (unable secondary to height) Type: Manual Max wheelchair distance: 150 ft Assist Level: Dependent (Pt equals 0%)  Cognition Comprehension Comprehension assist level: Understands basic 75 - 89% of the time/ requires cueing 10 - 24% of the time  Expression Expression assist level: Expresses basic 25 - 49% of the time/requires cueing 50 - 75% of the time. Uses single words/gestures.  Social Interaction Social Interaction assist level: Interacts appropriately 75 - 89% of the time - Needs redirection for appropriate language or to initiate interaction.  Problem Solving Problem solving assist level: Solves basic 25 - 49% of the time - needs direction more than half the time to initiate, plan or complete simple activities  Memory Memory assist level: Recognizes or recalls 25 - 49% of the time/requires cueing 50 - 75% of the time   Medical Problem List and Plan: 1.  Functional, cognitive and mobility deficits  secondary to hemorrhagic left basal ganglia infarct    Cont CIR   WHO ordered   Notes reviewed, images reviewed, labs reviewed 2.  DVT Prophylaxis/Anticoagulation: Mechanical: Sequential compression devices, below knee Bilateral lower extremities   Dopplers neg for DVT 3. Pain Management: N/A 4. Mood: LCSW to follow for evaluation and support as mentation improves.  5. Neuropsych: This patient is not capable of making decisions on her own behalf. 6. Skin/Wound Care: routine pressure relief measures.  7. Fluids/Electrolytes/Nutrition:  Encourage oral intake.   BMP within acceptable limits on 12/16 8. HTN: Monitor BP qid. On lisinopril, HCTZ and metoprolol. Monitor renal status while on nectars.   Relatively controlled on 12/17 9. Leukocytosis: Resolved   Question due to RML aspiration PNA --being monitored and has resolved.  10 Dysphagia: Continue dysphagia 2, nectar liquids.Monitor hydration status with routine checks.    Advance diet as tolerated 11. Dyslipidemia: On Lipitor  12. T2DM: Hgb A1C-7.1.  Monitor BS ac/hs. Resume metformin.  CBG (last 3)  Recent Labs    09/07/17 1645 09/07/17 2122 09/08/17 0651  GLUCAP 126* 134* 117*    Relatively controlled on 12/17  LOS (Days) 2 A FACE TO FACE EVALUATION WAS PERFORMED  Karlea Mckibbin Karis JubaAnil Tavarious Freel, MD 09/08/2017 11:21 AM

## 2017-09-08 NOTE — Care Management (Signed)
[facility] Individual Statement of Services  Patient Name:  Cindy Thornton  Date:  09/08/2017  Welcome to the [facility].  Our goal is to provide you with an individualized program based on your diagnosis and situation, designed to meet your specific needs.  With this comprehensive rehabilitation program, you will be expected to participate in at least 3 hours of rehabilitation therapies Monday-Friday, with modified therapy programming on the weekends.  Your rehabilitation program will include the following services:  Physical Therapy (PT), Occupational Therapy (OT), Speech Therapy (ST), 24 hour per day rehabilitation nursing, Therapeutic Recreaction (TR), Neuropsychology, Case Management (Social Worker), Rehabilitation Medicine, Nutrition Services, Engineer, productionharmacy Services and Interpreter Services  Weekly team conferences will be held on Wednesdays to discuss your progress.  Your Social Worker will talk with you frequently to get your input and to update you on team discussions.  Team conferences with you and your family in attendance may also be held.  Expected length of stay: 3 weeks    Overall anticipated outcome: minimal assistance  Depending on your progress and recovery, your program may change. Your Social Worker will coordinate services and will keep you informed of any changes. Your Social Worker's name and contact numbers are listed  below.  The following services may also be recommended but are not provided by the [facility]:   Driving Evaluations  Home Health Rehabiltiation Services  Outpatient Rehabilitation Services    Arrangements will be made to provide these services after discharge if needed.  Arrangements include referral to agencies that provide these services.  Your insurance has been verified to be:  None Your primary doctor is:  Thomasene MohairLaura Driggers, PA @ [facility]  Pertinent information will  be shared with your doctor and your insurance company.  Social Worker:  RooseveltLucy Shontae Rosiles, [place] [phone] or [phone]   Information discussed with and copy given to patient by: Amada JupiterHOYLE, [name], 09/08/2017, 1:55 PM

## 2017-09-08 NOTE — Progress Notes (Signed)
PMR Admission Coordinator Pre-Admission Assessment  Patient: Cindy Thornton is an 66 y.o., female MRN: [id number] DOB: 1951/02/12 Height: 5\' 5"  (165.1 cm) Weight: 70.8 kg (156 lb 1.4 oz)                                                                                                                                                  Insurance Information HMO:     PPO:      PCP:      IPA:      80/20:      OTHER:  PRIMARY: Uninsured/Self-pay      Policy#:       Subscriber:  CM Name:       Phone#:      Fax#:  Pre-Cert#:       Employer:  Benefits:  Phone #:      Name:  Eff. Date:      Deduct:       Out of Pocket Max:       Life Max:  CIR:       SNF:  Outpatient:      Co-Pay:  Home Health:       Co-Pay:  DME:      Co-Pay:  Providers:   Medicaid Application Date:       Case Manager:  Disability Application Date:       Case Worker:   Emergency Contact Information        Contact Information    Name Relation Home Work Mobile   Dominguez,Longino Son   [phone]   [place]   [phone]     Current Medical History  Patient Admitting Diagnosis: Left basal ganglia hemorrhage  History of Present Illness: Cindy Thornton a 66 y.o.femalewith history of T2DM, HTN who was admitted on 09/02/17 after being found by family with left sided weakness, right facial droop and difficulty speaking.History taken from chart review and son.CT headreviewed, showing left basal ganglia hemorrhage with surrounding edema and mild regional mass effect. Dr. Corbin Ade to be hypertensive in nature and she was started on nicardipine drip.MRI brain done revealing hemorrhage in left basal ganglia infarct, advanced chronic small vessel disease and chronic left parietal infarct.2D echo showed EF 60-65% with moderate LVH, mild aortic stenosis and trivial MVR. CTA head/neck done today revealing no evidence of increase in bleeding, aortic atherosclerosis and 20% stenosis of  proximal L-SA. MBS done 12/12and she was placedon dysphagia 1, nectar liquids.Mentation improving but verbal output with gibberish and severe dysarthria. Patient with resultant right sided weakness with sensory deficits as well as severe dysarthria with minimal breath support--question aphasia. CIR recommended by therapy team due to functional decline and patient admitted to IP Rehab 09/06/17.  NIH Total: 10  Past Medical History      Past Medical History:  Diagnosis Date  . Diabetes mellitus  without complication (HCC)   . Hyperlipidemia   . Hypertension     Family History  family history includes Healthy in her sister.  Prior Rehab/Hospitalizations:  Has the patient had major surgery during 100 days prior to admission? No  Current Medications   Current Facility-Administered Medications:  .  acetaminophen (TYLENOL) tablet 650 mg, 650 mg, Oral, Q4H PRN **OR** acetaminophen (TYLENOL) solution 650 mg, 650 mg, Per Tube, Q4H PRN **OR** acetaminophen (TYLENOL) suppository 650 mg, 650 mg, Rectal, Q4H PRN, Rejeana BrockKirkpatrick, McNeill P, MD .  atorvastatin (LIPITOR) tablet 10 mg, 10 mg, Oral, q1800, Costello, Mary A, NP, 10 mg at 09/04/17 1805 .  hydrochlorothiazide (MICROZIDE) capsule 12.5 mg, 12.5 mg, Oral, Daily, Micki RileySethi, Pramod S, MD, 12.5 mg at 09/05/17 0915 .  insulin aspart (novoLOG) injection 2-6 Units, 2-6 Units, Subcutaneous, Q4H, Rejeana BrockKirkpatrick, McNeill P, MD, 4 Units at 09/05/17 1332 .  lisinopril (PRINIVIL,ZESTRIL) tablet 20 mg, 20 mg, Oral, Daily, Costello, Mary A, NP, 20 mg at 09/05/17 0928 .  MEDLINE mouth rinse, 15 mL, Mouth Rinse, BID, Micki RileySethi, Pramod S, MD, 15 mL at 09/04/17 2142 .  metoprolol succinate (TOPROL-XL) 24 hr tablet 50 mg, 50 mg, Oral, Daily, Costello, Mary A, NP, 50 mg at 09/05/17 0915 .  pantoprazole (PROTONIX) EC tablet 40 mg, 40 mg, Oral, Daily, Masters, Darl Householderlison M, RPH, 40 mg at 09/04/17 2119 .  RESOURCE THICKENUP CLEAR, , Oral, PRN, Micki RileySethi, Pramod S, MD .   senna-docusate (Senokot-S) tablet 1 tablet, 1 tablet, Oral, BID, Rejeana BrockKirkpatrick, McNeill P, MD, 1 tablet at 09/05/17 0915  Patients Current Diet: DIET DYS 2 Room service appropriate? Yes; Fluid consistency: Nectar Thick  Precautions / Restrictions Precautions Precautions: Fall Precaution Comments: spanish speaking only Restrictions Weight Bearing Restrictions: No   Has the patient had 2 or more falls or a fall with injury in the past year?No  Prior Activity Level Community (5-7x/wk): Prior to admission patient lived with family and helped care for grandchildren.  She has a supportive family that are committed to now helping her as needed.  Home Assistive Devices / Equipment Home Assistive Devices/Equipment: None  Prior Device Use: Indicate devices/aids used by the patient prior to current illness, exacerbation or injury? None of the above  Prior Functional Level Prior Function Level of Independence: Independent Comments: ADLs. Does not read. Takes care of a few children in her home. Granddaughter does IADLs. Fatigues quickly with mobility  Self Care: Did the patient need help bathing, dressing, using the toilet or eating? Independent  Indoor Mobility: Did the patient need assistance with walking from room to room (with or without device)? Independent  Stairs: Did the patient need assistance with internal or external stairs (with or without device)? Independent  Functional Cognition: Did the patient need help planning regular tasks such as shopping or remembering to take medications? Independent  Current Functional Level Cognition  Arousal/Alertness: Awake/alert Overall Cognitive Status: Impaired/Different from baseline Current Attention Level: Sustained Orientation Level: Oriented to person, Oriented to place, Disoriented to time, Disoriented to situation Following Commands: Follows one step commands with increased time, Follows one step commands  inconsistently Safety/Judgement: Decreased awareness of safety, Decreased awareness of deficits General Comments: Pt with slow processing and requires increased time and cues for follow commands. Pt motivated to participate  Attention: Sustained, Selective Sustained Attention: Appears intact Selective Attention: Appears intact Memory: Appears intact Problem Solving: Appears intact Safety/Judgment: Appears intact    Extremity Assessment (includes Sensation/Coordination)  Upper Extremity Assessment: Overall WFL for tasks assessed, RUE deficits/detail  RUE Deficits / Details: No AROM of RUE. 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 RUE Sensation: decreased light touch RUE Coordination: decreased fine motor, decreased gross motor  Lower Extremity Assessment: Defer to PT evaluation RLE Deficits / Details: pt with initation of LAQ in sitting but no hip or ankle flexion    ADLs  Overall ADL's : Needs assistance/impaired Eating/Feeding: Minimal assistance, Sitting(supported sitting) Eating/Feeding Details (indicate cue type and reason): Pt able to bring spoon to mouth with LUE Grooming: Moderate assistance, Sitting Grooming Details (indicate cue type and reason): Pt able to maintain sitting at EOB with Min Guard A for safety. Pt requiring increased assistance for bilateral activities due to poor funcitonal use of RUE (dominant) with brunnstromm stage 1 Upper Body Bathing: Moderate assistance, Sitting Lower Body Bathing: Maximal assistance, Sit to/from stand, +2 for physical assistance Upper Body Dressing : Moderate assistance, Sitting Lower Body Dressing: Maximal assistance, Sit to/from stand, +2 for physical assistance Toilet Transfer: Moderate assistance, +2 for physical assistance, Stand-pivot(simulated to recliner) Toilet Transfer Details (indicate cue type and reason): Pt performing stand pivot to reclienr with 2 person hand held assistance and blocking of R  knee Functional mobility during ADLs: Moderate assistance, +2 for physical assistance, Cueing for sequencing(2 person hand held assist; stand pivot only) General ADL Comments: Pt demonstrating decreased fucnitonal performance and decreased balance, cognition, and fucntional use of RUE    Mobility  Overal bed mobility: Needs Assistance Bed Mobility: Supine to Sit, Sit to Supine Supine to sit: Min assist Sit to supine: Max assist, +2 for physical assistance General bed mobility comments: increased time and effort, assist to elevate trunk and assist of two to safely return to supine    Transfers  Overall transfer level: Needs assistance Equipment used: 2 person hand held assist Transfers: Sit to/from Stand Sit to Stand: Mod assist, +2 physical assistance Stand pivot transfers: Mod assist, +2 physical assistance General transfer comment: pt able to power up, R LE with noted instability and knee buckling requiring therapist to block knee    Ambulation / Gait / Stairs / Wheelchair Mobility  Ambulation/Gait General Gait Details: unable at this time    Posture / Balance Dynamic Sitting Balance Sitting balance - Comments: pt able to maintain midline without physical assist and Min guard for safety Balance Overall balance assessment: Needs assistance Sitting-balance support: No upper extremity supported Sitting balance-Leahy Scale: Fair Sitting balance - Comments: pt able to maintain midline without physical assist and Min guard for safety Standing balance support: Bilateral upper extremity supported, During functional activity Standing balance-Leahy Scale: Zero Standing balance comment: Reliant on physical assistance for standing    Special needs/care consideration BiPAP/CPAP: No CPM: No Continuous Drip IV: No Dialysis: No         Life Vest: No Oxygen: No Special Bed: No Trach Size: No Wound Vac (area): No Skin: WDL                               Bowel mgmt: None since  admission  Bladder mgmt: Incontinent  Diabetic mgmt: Hemoglobin A1c 7.1     Previous Home Environment Living Arrangements: Other relatives(granddaughter)  Lives With: Family Available Help at Discharge: Family, Available PRN/intermittently Home Care Services: No Additional Comments: pt lives with granddaughter and is indep with mobiltiy and ADLs, pt watches children in the home  Discharge Living Setting Plans for Discharge Living Setting: Other (Comment)(TBD family meeting this  weekend to determine who & where) Does the patient have any problems obtaining your medications?: Yes (Describe)(uninsured/self-pay )  Social/Family/Support Systems Patient Roles: Parent, Other (Comment)(Grandparent ) Contact Information: Antonio Christena Flakeominguez Para, Son Anticipated Caregiver: Family, Antonio stated family would meet this weekend to solidify Anticipated Caregiver's Contact Information: Granddaughter Leavy CellaJasmine speaks English and can coordinate [phone] Caregiver Availability: 24/7(among family members ) Discharge Plan Discussed with Primary Caregiver: Yes Is Caregiver In Agreement with Plan?: Yes Does Caregiver/Family have Issues with Lodging/Transportation while Pt is in Rehab?: No  Goals/Additional Needs Patient/Family Goal for Rehab: PT: Min A; OT: Supervision-Min A; SLP: Supervision  Expected length of stay: 15-20 days  Cultural Considerations: Spanish speaking  Dietary Needs: Dys.2 textures and nectar-thick liquids  Equipment Needs: TBD Special Service Needs: Intrepreter needed Pt/Family Agrees to Admission and willing to participate: Yes Program Orientation Provided & Reviewed with Pt/Caregiver Including Roles  & Responsibilities: Yes Additional Information Needs: Family meeting this weekend to determine who and where patient will be cared for after IP Rehab, clearly understand needed 24/7 at home is discharge plan  Information Needs to be Provided By: CSW for follow up   Barriers  to Discharge: Other (comments)(Clarification of who and where after weekend )  Decrease burden of Care through IP rehab admission: No  Possible need for SNF placement upon discharge: No  Patient Condition: This patient's medical and functional status has changed since the consult dated: 09/03/17 at 4:05 pm in which the Rehabilitation Physician determined and documented that the patient's condition is appropriate for intensive rehabilitative care in an inpatient rehabilitation facility. See "History of Present Illness" (above) for medical update. Functional changes are: Mod A +2 transfers. Patient's medical and functional status update has been discussed with the Rehabilitation physician and patient remains appropriate for inpatient rehabilitation. Will admit to inpatient rehab tomorrow.  Preadmission Screen Completed By:  Fae PippinMelissa Chigozie Basaldua, 09/05/2017 4:00 PM ______________________________________________________________________   Discussed status with Dr. Riley KillSwartz on 09/05/17 at 1605 and received telephone approval for admission tomorrow.  Admission Coordinator:  Fae PippinMelissa Murphy Duzan, time 1605/Date 09/05/17             Cosigned by: Ranelle OysterSwartz, Zachary T, MD at 09/05/2017 8:04 PM  Revision History

## 2017-09-08 NOTE — IPOC Note (Signed)
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)     Functional Problem List: Nursing Edema, Endurance, Sensory, Bladder, Motor  PT Balance, Sensory, Behavior, Endurance, Motor, Nutrition, Safety  OT Balance, Perception, Cognition, Safety, Sensory, Endurance, Skin Integrity, Vision, Motor  SLP Linguistic, Perception  TR         Basic ADL's: OT Grooming, Bathing, Dressing, Toileting     Advanced  ADL's: OT       Transfers: PT Bed Mobility, Bed to Chair, Car, Occupational psychologisturniture  OT Toilet, Research scientist (life sciences)Tub/Shower     Locomotion: PT Ambulation, Psychologist, prison and probation servicesWheelchair Mobility, Stairs     Additional Impairments: OT Fuctional Use of Upper Extremity  SLP Swallowing, Communication, Social Cognition comprehension, expression Problem Solving, Awareness  TR      Anticipated Outcomes Item Anticipated Outcome  Self Feeding No goal  Swallowing  Mod I   Basic self-care  Supervision-Min A  Toileting  Min A   Bathroom Transfers Min A  Bowel/Bladder  Pt will manage   Transfers  min assist  Locomotion  min assist  Communication  Min A  Cognition  Supervision  Pain  Pt will be free of pain  Safety/Judgment  Pt will call for assistance before getting up   Therapy Plan: PT Intensity: Minimum of 1-2 x/day ,45 to 90 minutes PT Frequency: 5 out of 7 days PT Duration Estimated Length of Stay: 3 weeks OT Intensity: Minimum of 1-2 x/day, 45 to 90 minutes OT Frequency: 5 out of 7 days OT Duration/Estimated Length of Stay: 3.5-4 weeks SLP Intensity: Minumum of 1-2 x/day, 30 to 90 minutes SLP Frequency: 3 to 5 out of 7 days SLP Duration/Estimated Length of Stay:  3.5 weeks    Team Interventions: Nursing Interventions Patient/Family Education, Bladder Management  PT interventions Ambulation/gait training, Discharge planning, Functional mobility training, Therapeutic Activities, Psychosocial support, Visual/perceptual remediation/compensation, Warden/rangerBalance/vestibular training, Disease management/prevention, Neuromuscular re-education, Therapeutic Exercise, Wheelchair propulsion/positioning, Cognitive remediation/compensation, DME/adaptive equipment instruction, Pain management, UE/LE Strength taining/ROM, Splinting/orthotics, Stair training, UE/LE Coordination activities, Patient/family education, Functional electrical stimulation, Community reintegration  OT Interventions Warden/rangerBalance/vestibular training, Discharge planning, Functional electrical stimulation, Pain management, Self Care/advanced ADL retraining, UE/LE Coordination activities, Therapeutic Activities, Cognitive remediation/compensation, Disease mangement/prevention, Functional mobility training, Patient/family education, Skin care/wound managment, Therapeutic Exercise, Visual/perceptual remediation/compensation, Community reintegration, Neuromuscular re-education, DME/adaptive equipment instruction, Psychosocial support, Splinting/orthotics, UE/LE Strength taining/ROM, Wheelchair propulsion/positioning  SLP Interventions Cueing hierarchy, Functional tasks, Multimodal communication approach, Patient/family education, Cognitive remediation/compensation  TR Interventions    SW/CM Interventions Discharge Planning, Psychosocial Support, Patient/Family Education   Barriers to Discharge MD  Medical stability  Nursing Medical stability, Incontinence, Weight    PT Decreased caregiver support, Home environment access/layout pt was living with granddaughter states she will not return there after d/c from hospital  OT Decreased caregiver support, Home environment access/layout Finding suitable d/c environment   SLP       SW       Team Discharge Planning: Destination: PT-Home ,OT- Home , SLP-Home Projected Follow-up: PT-Home health PT, OT-  Home health OT, 24 hour supervision/assistance, SLP-Home Health SLP, 24 hour supervision/assistance Projected Equipment Needs: PT-To be determined, OT- To be determined, SLP-None recommended by SLP Equipment Details: PT- , OT-Depending on d/c location Patient/family  involved in discharge planning: PT- Patient, Family member/caregiver,  OT-Patient, Family member/caregiver, SLP-Patient  MD ELOS: 22-25 days. Medical Rehab Prognosis:  Good Assessment: 66 y.o.femalewith history of T2DM, HTN who was admitted on 09/02/17 after being found by family with left sided weakness, right facial droop and difficulty speaking.CT headreviewed, showing left basal ganglia hemorrhage  with surrounding edema and mild regional mass effect. Dr. Pearlean BrownieSethi felt to be hypertensive in nature and she was started on nicardipine drip. MRI brain done revealing hemorrhage in left basal ganglia infarct, advanced chronic small vessel disease and chronic left parietal infarct. 2D echo showed EF 60-65% with moderate LVH, mild aortic stenosis and trivial MVR. CTA head/neck done today revealing no evidence of increase in bleeding, aortic atherosclerosis and 20% stenosis of proximal L-SA.  MBS done 12/12 and she was placed on dysphagia diet. Mentation improving but verbal output limited with severe dysarthria.  Patient with resultant right sided weakness with sensory deficits as well as severe dysarthria with minimal breath support ?aphasia. Will set goals for Min A for most tasks with PT/OT/SLP.   See Team Conference Notes for weekly updates to the plan of care

## 2017-09-08 NOTE — Progress Notes (Signed)
Physical Medicine and Rehabilitation Consult   Reason for Consult: Stroke with aphasia and functional deficits  Referring Physician: Dr. Pearlean BrownieSethi.    HPI: Cindy Thornton is a 66 y.o. female with history of T2DM, HTN who was admitted on 09/02/17 after being found by family with left sided weakness, right facial droop and difficulty speaking. History taken from chart review and son.  CT head reviewed, showing left basal ganglia hemorrhage.  Per report, left basal ganglia hemorrhage with surrounding edema and mild regional mass effect.  Bleed felt to be hypertensive in nature and she was started on nicardipine drip.  2D echo showed EF 60-65% with moderate LVH, mild aortic stenosis and trivial  MVR.  CTA head/neck done today revealing no evidence of increase in bleeding, aortic atherosclerosis and 20% stenosis of proximal L-SA.  MBS done today and patient started on dysphagia 1, nectar liquids.  Patient with resultant right sided weakness with sensory deficits as well as severe dysarthria with minimal breath support--question aphasia. CIR recommended by therapy team due to functional decline.    Review of Systems  Unable to perform ROS: Mental acuity       Past Medical History:  Diagnosis Date  . Diabetes mellitus without complication (HCC)   . Hyperlipidemia   . Hypertension     History reviewed. No pertinent surgical history.         Family History  Problem Relation Age of Onset  . Healthy Sister       Social History: Lives with niece and helped take care of 3 young children under 68 years old. She was sedentary per reports. Has family living in the surrounding area, but working.   Per reports that  has never smoked. She has never used smokeless tobacco. Per reports she does not drink alcohol or use drugs.    Allergies: No Known Allergies          Medications Prior to Admission  Medication Sig Dispense Refill  . atorvastatin (LIPITOR) 20 MG tablet Take 20 mg  by mouth daily.    Marland Kitchen. lisinopril-hydrochlorothiazide (PRINZIDE,ZESTORETIC) 10-12.5 MG tablet Take 1 tablet by mouth daily.    . metFORMIN (GLUCOPHAGE-XR) 500 MG 24 hr tablet Take 1,000 mg by mouth daily.    . metoprolol succinate (TOPROL-XL) 50 MG 24 hr tablet Take 50 mg by mouth daily. Take with or immediately following a meal.      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 Does not read. Takes care of a few children in her home. Granddaughter does IADLs. Fatigues quickly with mobility Functional Status:  Mobility: Bed Mobility Overal bed mobility: Needs Assistance Bed Mobility: Supine to Sit Supine to sit: Min assist, Mod assist, +2 for physical assistance General bed mobility comments: HOB elevated, pt able to bring LEs over to EOB, pt used L UE to pull up, modA to scoot to EOB Transfers Overall transfer level: Needs assistance Equipment used: (2 person lift with gait belt and bed pad) Transfers: Sit to/from Stand, Stand Pivot Transfers Sit to Stand: Mod assist, +2 physical assistance Stand pivot transfers: Mod assist, +2 physical assistance General transfer comment: pt able to power up, R LE with noted instability and knee buckling requiring therapist  to block knee Ambulation/Gait General Gait Details: unable at this time  ADL: ADL Overall ADL's : Needs assistance/impaired Eating/Feeding: Minimal assistance, Sitting(supported sitting)  Cognition: Cognition Overall Cognitive Status: Impaired/Different from baseline Arousal/Alertness: Awake/alert Orientation Level: Oriented to person,  Disoriented to time, Disoriented to situation, Oriented to place Attention: Sustained, Selective Sustained Attention: Appears intact Selective Attention: Appears intact Memory: Appears intact Problem Solving: Appears intact Safety/Judgment: Appears intact Cognition Arousal/Alertness: Awake/alert Behavior During Therapy: WFL for tasks assessed/performed, Flat affect Overall Cognitive Status: Impaired/Different from baseline Area of Impairment: Following commands, Safety/judgement, Awareness, Problem solving, Attention Current Attention Level: Sustained Following Commands: Follows one step commands with increased time, Follows one step commands inconsistently Safety/Judgement: Decreased awareness of safety, Decreased awareness of deficits Awareness: Intellectual Problem Solving: Slow processing, Requires verbal cues, Requires tactile cues General Comments: Pt with slow processing and requires increased time and cues for follow commands. Pt motivated to participate    Blood pressure (!) 138/53, pulse 87, temperature 99 F (37.2 C), temperature source Oral, resp. rate 13, height 5\' 5"  (1.651 m), weight 70.8 kg (156 lb 1.4 oz), SpO2 96 %. Physical Exam  Nursing note and vitals reviewed. Constitutional: She appears well-developed and well-nourished. She appears lethargic. She is easily aroused. No distress.  Fatigued appearing and needed sternal rubs to arouse. Nurse reported multiple tests but sat up for an hour this afternoon.   HENT:  Head: Normocephalic and atraumatic.  Nose: Nose normal.  Eyes: Conjunctivae are normal. Pupils are equal, round, and reactive to light. Right eye exhibits no discharge. Left eye exhibits no discharge.  Neck: Normal range of motion. Neck supple.  Cardiovascular: Normal rate and regular rhythm.  Respiratory: Effort normal and breath sounds normal. No stridor. No respiratory distress.  GI: Soft. Bowel sounds are normal. She exhibits no distension. There is  no tenderness.  Musculoskeletal: She exhibits edema. She exhibits no tenderness.  Right hand with min edema.   Neurological: She is easily aroused. She appears lethargic.  Right facial weakness with right inattention.  She was oriented to self only.  Garbled speech.   Unable to follow simple motor commands consistently. Global aphasia Motor: (limited by language, and cooperation):  RUE appears to be 0/5 proxial to distal RLE: 1+/5 proximal to distal LUE: Likely 5/5 proxiaml to distal LLE: Likely 4/5 proximal to distal  Skin: Skin is warm and dry. No rash noted. She is not diaphoretic.  Psychiatric: Her affect is blunt. Her speech is delayed. She is slowed. Cognition and memory are impaired. She is inattentive.   Assessment/Plan: Diagnosis: Left basal ganglia hemorrhage  Labs and images independently reviewed.  Records reviewed and summated above. Stroke: Continue secondary stroke prophylaxis and Risk Factor Modification listed below:   Blood Pressure Management:  Continue current medication with prn's with permisive HTN per primary team Diabetes management:   Right sided hemiparesis: fit for orthosis to prevent contractures (resting hand splint for day, wrist cock up splint at night, PRAFO, etc) Motor recovery: Fluoxetine  1. Does the need for close, 24 hr/day medical supervision in concert with the patient's rehab needs make it unreasonable for this patient to be served in a less intensive setting? Yes  2. Co-Morbidities requiring supervision/potential complications: dysphagia (advance diet as tolerated), T2 DM (Monitor in accordance with exercise and adjust meds as necessary), HTN (monitor and provide prns in accordance with increased physical exertion and pain), leukocytosis (cont to monitor for signs and symptoms of infection, further workup if indicated) 3. Due to bladder management,  safety, skin/wound care, disease management, medication administration, pain management and patient  education, does the patient require 24 hr/day rehab nursing? Yes 4. Does the patient require coordinated care of a physician, rehab nurse, PT (1-2 hrs/day, 5 days/week), OT (1-2 hrs/day, 5 days/week) and SLP (1-2 hrs/day, 5 days/week) to address physical and functional deficits in the context of the above medical diagnosis(es)? Yes Addressing deficits in the following areas: balance, endurance, locomotion, strength, transferring, bowel/bladder control, bathing, dressing, feeding, grooming, toileting, cognition, speech, language, swallowing and psychosocial support 5. Can the patient actively participate in an intensive therapy program of at least 3 hrs of therapy per day at least 5 days per week? Potentially 6. The potential for patient to make measurable gains while on inpatient rehab is excellent 7. Anticipated functional outcomes upon discharge from inpatient rehab are min assist  with PT, supervision and min assist with OT, supervision with SLP. 8. Estimated rehab length of stay to reach the above functional goals is: 15-20 days. 9. Anticipated D/C setting: Home 10. Anticipated post D/C treatments: HH therapy and Home excercise program 11. Overall Rehab/Functional Prognosis: good  RECOMMENDATIONS: This patient's condition is appropriate for continued rehabilitative care in the following setting: CIR when medically appropriate and able to tolerate 3 hours of therapy/day. Patient has agreed to participate in recommended program. Potentially Note that insurance prior authorization may be required for reimbursement for recommended care.  Comment: Rehab Admissions Coordinator to follow up.  Maryla MorrowAnkit Patel, MD, ABPMR Jacquelynn CreeLove, Pamela S, [place]PA-C 09/03/2017          Revision History

## 2017-09-08 NOTE — Evaluation (Addendum)
Speech Language Pathology Assessment and Plan  Patient Details  Name: Cindy Thornton MRN: [id number] Date of Birth: 01-Dec-1950  SLP Diagnosis: Aphasia;Dysphagia;Dysarthria  Rehab Potential: Good ELOS: 3.5 weeks    Today's Date: 09/08/2017 SLP Individual Time: 1505-6979 SLP Individual Time Calculation (min): 75 min   Problem List:  Patient Active Problem List   Diagnosis Date Noted  . Diabetes mellitus type 2 in nonobese (HCC)   . Right hemiparesis ([place])   . 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 ICH (intracerebral hemorrhage) ([place]) 09/06/2017  . Leukocytosis   . Dysphagia, post-stroke   . Benign essential HTN   . Diabetes mellitus type 2 in obese ([place])   . 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 Diabetes mellitus without complication ([place])   . Hyperlipidemia   . Hypertension    Past Surgical History: History reviewed. No pertinent surgical history.  Assessment / Plan / Recommendation Clinical Impression Cindy Zuba Sotais a 66 y.o.femalewith history of T2DM, HTN who was admitted on 09/02/17 after being found by family with left sided weakness, right facial droop and difficulty speaking.History taken from chart review and son.CT headreviewed, showing left basal ganglia hemorrhage with surrounding edema and mild regional mass effect. Dr. Wynn Banker to be hypertensive in nature and she was started on nicardipine drip.MRI brain done revealing hemorrhage in left basal ganglia infarct, advanced chronic small vessel disease and chronic left parietal infarct.2D echo showed EF 60-65% with moderate LVH, mild aortic stenosis and trivial MVR. CTA head/neck done today revealing no evidence of increase in bleeding, aortic atherosclerosis and 20% stenosis of proximal L-SA. MBS done 12/12and she was placedon dysphagia 1, nectar  liquids.Mentation improving but verbal output with gibberish and severe dysarthria. Patient with resultant right sided weakness with sensory deficits as well as severe dysarthria with minimal breath support--question aphasia. CIR recommended by therapy team due to functional decline. Comprehensive speech-language evaluation and bedside swallow evaluation completed on 09/08/17. Pt lives with son and spent "very little" time in schoool when living in [place]. 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 She doesn't speak [place]. Interpreter present during speech-language evaluation. At this time, pt presents with severe expressive aphasia. She has the ability to name common objects but when attenpting to describe their function or describe simple pictures, pt's speech contains neologisms and jargon. ~ 25% of pt's expressive communication is appropriate language.  In addition pt demonstrates mild dysarthria c/b imprecise articulation and decreased vocal intensity. Pt's receptive language is also decreased as she is unable to follow 2 step simple directions. Cognitively, pt is able to answer simple yes/no questions, has some awareness of expressive deficits and is able to sustain attention in mildly distracting environment. She is not aware of current physical deficits and their impact on daily function. BSE completed with skilled observation of pt consuming V8 without overt s/s of aspiration. Skilled ST is required to address the above mentioned goals and to further assess safety with diet and possibility of diet upgrade to increase functional independence and reduce caregiver burden. Anticipate that pt will require supervision and possibly SNF placement at discharge from CIR.    Skilled Therapeutic Interventions          Skilled treatment session focused on completion of speech-language evaluation and bedside swallow evaluation, see above.  Pt required Total A to  name days of week, months of year as well as phrase level description of pictures or object function. Phonemic cues were not helpful but semantic cues were helpful. Pt required Max A cues to increase vocal intensity at the word level. Pt consumed V8 without overt s/s of aspiration. At the end of session, pt able to describe feeling on not being able to get her words out of "her throat."   SLP Assessment  Patient will need skilled Speech Lanaguage Pathology Services during CIR admission    Recommendations  SLP Diet Recommendations: Dysphagia 2 (Fine chop);Nectar Liquid Administration via: Cup Medication Administration: Whole meds with puree Supervision: Full supervision/cueing for compensatory strategies Compensations: Slow rate;Small sips/bites;Use straw to facilitate chin tuck;Chin tuck Postural Changes and/or Swallow Maneuvers: Seated upright 90 degrees Oral Care Recommendations: Oral care BID Patient destination: Home Follow up Recommendations: Home Health SLP;24 hour supervision/assistance Equipment Recommended: None recommended by SLP    SLP Frequency 3 to 5 out of 7 days   SLP Duration  SLP Intensity  SLP Treatment/Interventions 3.5 weeks  Minumum of 1-2 x/day, 30 to 90 minutes  Cueing hierarchy;Functional tasks;Multimodal communication approach;Patient/family education;Cognitive remediation/compensation    Pain    Prior Functioning Cognitive/Linguistic Baseline: Within functional limits Type of Home: House  Lives With: Family(Son) Available Help at Discharge: Family;Available PRN/intermittently Education: illiterate Vocation: Works at [employer] care of cooking, cleaning and child care for son)  Function:  Eating Eating   Modified Consistency Diet: Yes Eating Assist Level: Swallowing techniques: self managed;Supervision or verbal cues;Set up assist for;More than reasonable amount of time   Eating Set Up Assist For: Opening containers;Cutting  food       Cognition Comprehension Comprehension assist level: Understands basic 50 - 74% of the time/ requires cueing 25 - 49% of the time;Understands basic 25 - 49% of the time/ requires cueing 50 - 75% of the time(With interpreter present)  Expression Expression assistive device: (via interpreter) Expression assist level: Expresses basis less than 25% of the time/requires cueing >75% of the time.(d/t expressive aphasia)  Social Interaction Social Interaction assist level: Interacts appropriately 75 - 89% of the time - Needs redirection for appropriate language or to initiate interaction.  Problem Solving Problem solving assist level: Solves basic 25 - 49% of the time - needs direction more than half the time to initiate, plan or complete simple activities  Memory Memory assist level: Recognizes or recalls 25 - 49% of the time/requires cueing 50 - 75% of the time   Short Term Goals: Week 1: SLP Short Term Goal 1 (Week 1): Pt will follow 2 step simple directions with Min A cues to achieve ~ 75% accuracy.  SLP Short Term Goal 2 (Week 1): Pt will utilize an increased vocal intensity at the word level to achieve increased intelligibility of ~ 75% with Mod A cues.  SLP Short Term Goal 3 (Week 1): Pt will answer yes/no questions about current physical and aphasic condition to demonstrate intellectual awareness with ~ 75% accuracy and Mod A cues.  SLP Short Term Goal 4 (Week 1): Pt will complete rote/automatic verbal sequences with Max A cues.  SLP Short Term Goal 5 (Week 1): Pt will produce 2 to 3 word utterances during structured verbal tasks with Max A cues.  SLP Short Term Goal 6 (Week 1): Pt will consume thin liquids and dysphagia 3 with minimal overt s/s of aspiration and Min A cues for use of compensatory strategies.   Refer to Care Plan for Long Term Goals  Recommendations for other services: None   Discharge Criteria: Patient will be discharged from SLP if patient refuses treatment 3  consecutive times without medical reason, if treatment goals not met, if there is a change in medical status, if patient makes no progress towards goals or if patient is discharged from hospital.  The above assessment, treatment plan, treatment alternatives and goals were discussed and mutually agreed upon: by patient  Yarden Hillis 09/08/2017, 3:52 PM

## 2017-09-08 NOTE — Progress Notes (Signed)
Physical Therapy Note  Patient Details  Name: [name] MRN: [id number] Date of Birth: 1951/01/27 Today's Date: 09/08/2017    Time: 900-957 57 minutes  1:1 No c/o pain.  Pt performed rolling in bed with min A, pants donned with total A.  Supine to sit with mod A.  squat pivot transfers throughout session mod/max A due to deficits in motor planning, strength and coordination.  Sit to stands and standing wt shifts with max A to stand, total A to block Rt knee to prevent buckling, pt with slight pushing tendencies to Rt, difficulty crossing midline to Lt.  Gait at railing in hallway 10' x 2 with max A, pt with increased Rt knee buckling and pushing when fatigued.  kinetron 3 x 2 minutes for LE strengthening.  Pt seated in ultra hemi height w/c for improved posture and sitting tolerance.   Tonika Eden 09/08/2017, 9:58 AM

## 2017-09-08 NOTE — Progress Notes (Signed)
Occupational Therapy Session Note  Patient Details  Name: Cindy Thornton MRN: [id number] Date of Birth: 1951-01-10  Today's Date: 09/08/2017 OT Individual Time: 1100-1200 OT Individual Time Calculation (min): 60 min    Short Term Goals: Week 1:  OT Short Term Goal 1 (Week 1): Pt will complete UB dressing with Min A OT Short Term Goal 2 (Week 1): Pt will complete 1/3 components of donning pants OT Short Term Goal 3 (Week 1): Pt will complete toileting tasks with 1 helper  Skilled Therapeutic Interventions/Progress Updates:    Pt seen for ADL training with a focus on sit to stand using Stedy and hemidressing techniques.  Pt received in w/c and completed a squat pivot to her hemiside with max A.  Worked on sit to stand with max A but due to RLE stability and leaning to the R, opted to use Stedy for support for the rest of the transfers.  Pt used stedy to stand with mod A for cleansing and then to transfer to tub bench. Bench placed on lowest height but her feet could not touch so placed plastic aerobic bench under her feet for support. Pt completed bathing with mod A, UB with min A and total for LB dressing.  Pt resting in w/c. PROM to RUE.  RUE placed on pillow for support and lower table brought to room so she could reach her meal tray.  RN in room with pt.   Therapy Documentation Precautions:  Precautions Precautions: Fall Precaution Comments: Rt hemi, Rt inattention, interpretor needs (Spanish)  Restrictions Weight Bearing Restrictions: No   Pain: no c/o pain   ADL: ADL ADL Comments: Please see functional navigator for ADL status  See Function Navigator for Current Functional Status.   Therapy/Group: Individual Therapy  Vickey Ewbank 09/08/2017, 12:52 PM

## 2017-09-09 ENCOUNTER — Inpatient Hospital Stay (HOSPITAL_COMMUNITY): Payer: Self-pay | Admitting: Physical Therapy

## 2017-09-09 ENCOUNTER — Inpatient Hospital Stay (HOSPITAL_COMMUNITY): Payer: Self-pay | Admitting: Occupational Therapy

## 2017-09-09 ENCOUNTER — Inpatient Hospital Stay (HOSPITAL_COMMUNITY): Payer: Self-pay | Admitting: Speech Pathology

## 2017-09-09 LAB — GLUCOSE, CAPILLARY
GLUCOSE-CAPILLARY: 117 mg/dL — AB (ref 65–99)
GLUCOSE-CAPILLARY: 119 mg/dL — AB (ref 65–99)
Glucose-Capillary: 158 mg/dL — ABNORMAL HIGH (ref 65–99)
Glucose-Capillary: 114 mg/dL — ABNORMAL HIGH (ref 65–99)

## 2017-09-09 NOTE — Progress Notes (Signed)
Occupational Therapy Session Note  Patient Details  Name: Cindy Thornton MRN: [id number] Date of Birth: 1951-09-23  Today's Date: 09/09/2017 OT Individual Time: 1300-1415 OT Individual Time Calculation (min): 75 min    Short Term Goals: Week 1:  OT Short Term Goal 1 (Week 1): Pt will complete UB dressing with Min A OT Short Term Goal 2 (Week 1): Pt will complete 1/3 components of donning pants OT Short Term Goal 3 (Week 1): Pt will complete toileting tasks with 1 helper  Skilled Therapeutic Interventions/Progress Updates:    Pt received sitting up in w/c, agreeable to OT tx session. Pt completes bathing/dressing ADLs seated EOB to challenge static/dynamic sitting balance, transfers to EOB using Stedy with Min-ModA for sit<>stand. Pt requires mod verbal cues to attend to RUE during bathing; Max HOH assist to wash LUE using RUE. ModA for task completion and Max verbal cues for using hemi techniques to don overhead shirt, ModA for LB dressing, with MaxA for sit<>stand and Mod steadying assist provided in standing while therapist advances pants over hips. Pt completes stand pivot transfer EOB>w/c to the R with MaxA. Transported Pt total assist to therapy gym; Pt engaging in seated tabletop activity with focus on scanning to the R; Pt completes approx 50% of activity, though with increased effort, reports she typically wears reading glasses and does not have them with her. Returned to room where Pt engaged in AA/PROM to RUE. Pt left end of session seated in w/c, QRB donned, call bell and needs within reach.   Therapy Documentation Precautions:  Precautions Precautions: Fall Precaution Comments: Rt hemi, Rt inattention, interpretor needs (Spanish)  Restrictions Weight Bearing Restrictions: No   Pain: Pain Assessment Pain Assessment: No/denies pain ADL: ADL ADL Comments: Please see functional navigator for ADL status  See Function Navigator for Current Functional  Status.   Therapy/Group: Individual Therapy  Orlando PennerBreanna L Keala Drum 09/09/2017, 3:55 PM

## 2017-09-09 NOTE — Progress Notes (Signed)
Speech Language Pathology Daily Session Note  Patient Details  Name: Cindy Thornton MRN: [id number] Date of Birth: [birth year]  Today's Date: 09/09/2017 SLP Individual Time: 1100-1200 SLP Individual Time Calculation (min): 60 min  Short Term Goals: Week 1: SLP Short Term Goal 1 (Week 1): Pt will follow 2 step simple directions with Min A cues to achieve ~ 75% accuracy.  SLP Short Term Goal 2 (Week 1): Pt will utilize an increased vocal intensity at the word level to achieve increased intelligibility of ~ 75% with Mod A cues.  SLP Short Term Goal 3 (Week 1): Pt will answer yes/no questions about current physical and aphasic condition to demonstrate intellectual awareness with ~ 75% accuracy and Mod A cues.  SLP Short Term Goal 4 (Week 1): Pt will complete rote/automatic verbal sequences with Max A cues.  SLP Short Term Goal 5 (Week 1): Pt will produce 2 to 3 word utterances during structured verbal tasks with Max A cues.  SLP Short Term Goal 6 (Week 1): Pt will consume thin liquids and dysphagia 3 with minimal overt s/s of aspiration and Min A cues for use of compensatory strategies.   Skilled Therapeutic Interventions:  Pt was seen for skilled ST targeting communication and dysphagia goals.  Pt needed max assist to coordinate onset of phonation with respiration to achieve intelligibility when naming basic, familiar objects [facility].  Pt also needed max assist to generate 2-3 word responses when describing problems in pictures due to verbal errors characterized by groping for words.  Pt needed min verbal cues to recall chin tuck posture for when consuming thin liquids.  She returned demonstration of chin tuck with min faded to supervision cues with no overt s/s of aspiration with trials.  Pt was returned to room and left in wheelchair with quick release belt donned and call bell within reach.  Continue per current plan of care.    Function:  Eating Eating   Modified Consistency Diet:  Yes Eating Assist Level: Supervision or verbal cues           Cognition Comprehension Comprehension assist level: Understands basic 50 - 74% of the time/ requires cueing 25 - 49% of the time  Expression   Expression assist level: Expresses basic 25 - 49% of the time/requires cueing 50 - 75% of the time. Uses single words/gestures.  Social Interaction Social Interaction assist level: Interacts appropriately 75 - 89% of the time - Needs redirection for appropriate language or to initiate interaction.  Problem Solving Problem solving assist level: Solves basic 50 - 74% of the time/requires cueing 25 - 49% of the time  Memory Memory assist level: Recognizes or recalls 25 - 49% of the time/requires cueing 50 - 75% of the time    Pain Pain Assessment Pain Assessment: No/denies pain  Therapy/Group: Individual Therapy  Durante Violett, [name] 09/09/2017, 12:22 PM

## 2017-09-09 NOTE — ED Provider Notes (Signed)
Pt was a "Code Stroke." Her airway was cleared by me on her arrival to the ED and then she immediately went to CT. She was immediately evaluated by neurology who managed her care in the ED and admission.    Raeford RazorKohut, Auden Tatar, MD 09/09/17 1104

## 2017-09-09 NOTE — Progress Notes (Signed)
Physical Therapy Note  Patient Details  Name: Cindy Thornton MRN: [id number] Date of Birth: Apr 10, 1951 Today's Date: 09/09/2017    Time: 900-956 56 minutes  1:1  No c/o pain.  Pt performs bed mobility with mod A with cues for awareness of Rt UE.  Sitting balance with supervision with assist to don shirt and pants.  Standing balance to don pants with mod A.  Stand pivot transfers throughout session with mod/max A.  Standing reaching task with focus on Rt knee and hip strength and stability with mod/max manual facilitation for neutral pelvic alignment and to prevent Rt knee buckling.  Supine NMR for LE and UE strength and coordination with pt improving motor planning with tactile cues and repetition.  Pt left in bed with needs at hand.   Graden Hoshino 09/09/2017, 9:58 AM

## 2017-09-09 NOTE — Progress Notes (Signed)
[place] PHYSICAL MEDICINE & REHABILITATION     PROGRESS NOTE    Subjective/Complaints: Pt seen laying in bed this AM.  She selpt well overnight.  No reported issues.   ROS: Limited due to language.  Objective: Vital Signs: Blood pressure (!) 154/47, pulse 80, temperature 98.2 F (36.8 C), temperature source Oral, resp. rate 18, height 5' (1.524 m), weight 69.6 kg (153 lb 7 oz), SpO2 95 %. No results found. Recent Labs    09/07/17 0509  WBC 8.1  HGB 12.5  HCT 38.6  PLT 198   Recent Labs    09/07/17 0509  NA 140  K 3.7  CL 102  GLUCOSE 121*  BUN 19  CREATININE 0.81  CALCIUM 9.2   CBG (last 3)  Recent Labs    09/08/17 1636 09/08/17 2108 09/09/17 0743  GLUCAP 111* 161* 117*    Wt Readings from Last 3 Encounters:  09/06/17 69.6 kg (153 lb 7 oz)  09/02/17 70.8 kg (156 lb 1.4 oz)    Physical Exam:  Constitutional: She appears well-developed and well-nourished. No distress.  HENT: Normocephalic and atraumatic.  Eyes: EOMI. No discharge.  Cardiovascular: RRR. No JVD  Respiratory: CTA Bilaterally. Normal effort.  GI: Bowel sounds are normal. She exhibits no distension.  Musculoskeletal: She exhibits no edema or tenderness.  Neurological: She is alert.  Right facial weakness with right inattention.     Speech dysarthric     Had difficulty following simple motor commands (?language) Motor: Right upper extremity 0/5 proximal to distal (stable).   Right lower extremity: ?3/5 proximal to distal  Left upper/left lower extremity:  Grossly 5/5.   Skin: Skin is warm and dry. She is not diaphoretic.  Psychiatric: Limited due to language  Assessment/Plan: 1.  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 Physiatrist is providing close team supervision and 24 hour management of active medical problems listed below. Physiatrist  and rehab team continue to assess barriers to discharge/monitor patient progress toward functional and medical goals.  Function:  Bathing Bathing position   Position: Shower  Bathing parts Body parts bathed by patient: Chest, Abdomen, Front perineal area, Right upper leg, Left upper leg, Right arm Body parts bathed by helper: Left arm, Buttocks  Bathing assist        Upper Body Dressing/Undressing Upper body dressing   What is the patient wearing?: Pull over shirt/dress     Pull over shirt/dress - Perfomed by patient: Thread/unthread left sleeve, Put head through opening, Pull shirt over trunk Pull over shirt/dress - Perfomed by helper: Thread/unthread right sleeve        Upper body assist Assist Level: (Mod A)      Lower Body Dressing/Undressing Lower body dressing   What is the patient wearing?: Pants, Socks, Shoes       Pants- Performed by helper: Thread/unthread right pants leg, Thread/unthread left pants leg, Pull pants up/down   Non-skid slipper socks- Performed by helper: Don/doff right sock, Don/doff left sock Socks - Performed by patient: Don/doff left sock   Shoes - Performed by patient: Don/doff right shoe, Don/doff left shoe, Fasten right, Fasten left         TED Hose - Performed by helper: Don/doff right TED hose, Don/doff left TED hose  Lower body assist Assist for lower body dressing: (Total A)      Toileting Toileting Toileting activity did not occur:  No continent bowel/bladder event   Toileting steps completed by helper: Performs perineal hygiene, Adjust clothing prior to toileting, Adjust clothing after toileting Toileting Assistive Devices: Grab bar or rail  Toileting assist Assist level: Two helpers   Transfers Chair/bed transfer   Chair/bed transfer method: Stand pivot Chair/bed transfer assist level: Moderate assist (Pt 50 - 74%/lift or lower) Chair/bed transfer assistive device: Armrests     Locomotion Ambulation     Max distance:  30 ft Assist level: Maximal assist (Pt 25 - 49%)   Wheelchair Wheelchair activity did not occur: (unable secondary to height) Type: Manual Max wheelchair distance: 150 ft Assist Level: Dependent (Pt equals 0%)  Cognition Comprehension Comprehension assist level: Understands basic 50 - 74% of the time/ requires cueing 25 - 49% of the time, Understands basic 25 - 49% of the time/ requires cueing 50 - 75% of the time(With interpreter present)  Expression Expression assist level: Expresses basis less than 25% of the time/requires cueing >75% of the time.(d/t expressive aphasia)  Social Interaction Social Interaction assist level: Interacts appropriately 75 - 89% of the time - Needs redirection for appropriate language or to initiate interaction.  Problem Solving Problem solving assist level: Solves basic 25 - 49% of the time - needs direction more than half the time to initiate, plan or complete simple activities  Memory Memory assist level: Recognizes or recalls 25 - 49% of the time/requires cueing 50 - 75% of the time   Medical Problem List and Plan: 1.  Functional, cognitive and mobility deficits  secondary to hemorrhagic left basal ganglia infarct    Cont CIR   WHO, ROM 2.  DVT Prophylaxis/Anticoagulation: Mechanical: Sequential compression devices, below knee Bilateral lower extremities   Dopplers neg for DVT 3. Pain Management: N/A 4. Mood: LCSW to follow for evaluation and support as mentation improves.  5. Neuropsych: This patient is not capable of making decisions on her own behalf. 6. Skin/Wound Care: routine pressure relief measures.  7. Fluids/Electrolytes/Nutrition: Encourage oral intake.   BMP within acceptable limits on 12/16 8. HTN: Monitor BP qid. On lisinopril, HCTZ and metoprolol. Monitor renal status while on nectars.   Relatively controlled on 12/18 9. Leukocytosis: Resolved 10 Dysphagia: Continue dysphagia 2, nectar liquids.Monitor hydration status with routine checks.     Advance diet as tolerated 11. Dyslipidemia: On Lipitor  12. T2DM: Hgb A1C-7.1.  Monitor BS ac/hs. Resume metformin.  CBG (last 3)  Recent Labs    09/08/17 1636 09/08/17 2108 09/09/17 0743  GLUCAP 111* 161* 117*    Relatively controlled on 12/18  LOS (Days) 3 A FACE TO FACE EVALUATION WAS PERFORMED  Cindy Thornton Cindy JubaAnil Enyla Lisbon, MD 09/09/2017 9:52 AM

## 2017-09-10 ENCOUNTER — Inpatient Hospital Stay (HOSPITAL_COMMUNITY): Payer: Self-pay | Admitting: Speech Pathology

## 2017-09-10 ENCOUNTER — Inpatient Hospital Stay (HOSPITAL_COMMUNITY): Payer: Self-pay

## 2017-09-10 ENCOUNTER — Inpatient Hospital Stay (HOSPITAL_COMMUNITY): Payer: Self-pay | Admitting: Occupational Therapy

## 2017-09-10 LAB — BASIC METABOLIC PANEL
Anion gap: 9 (ref 5–15)
BUN: 30 mg/dL — ABNORMAL HIGH (ref 6–20)
CHLORIDE: 103 mmol/L (ref 101–111)
CO2: 29 mmol/L (ref 22–32)
CREATININE: 0.96 mg/dL (ref 0.44–1.00)
Calcium: 9.4 mg/dL (ref 8.9–10.3)
GFR calc non Af Amer: 59 mL/min — ABNORMAL LOW (ref >60)
Glucose, Bld: 117 mg/dL — ABNORMAL HIGH (ref 65–99)
POTASSIUM: 4.2 mmol/L (ref 3.5–5.1)
SODIUM: 141 mmol/L (ref 135–145)

## 2017-09-10 LAB — GLUCOSE, CAPILLARY
GLUCOSE-CAPILLARY: 116 mg/dL — AB (ref 65–99)
GLUCOSE-CAPILLARY: 121 mg/dL — AB (ref 65–99)
GLUCOSE-CAPILLARY: 99 mg/dL (ref 65–99)
Glucose-Capillary: 108 mg/dL — ABNORMAL HIGH (ref 65–99)

## 2017-09-10 MED ORDER — LISINOPRIL 20 MG PO TABS
20.0000 mg | ORAL_TABLET | Freq: Two times a day (BID) | ORAL | Status: DC
  Administered 2017-09-10 – 2017-09-30 (×38): 20 mg via ORAL
  Filled 2017-09-10 (×41): qty 1

## 2017-09-10 NOTE — Progress Notes (Signed)
[place] PHYSICAL MEDICINE & REHABILITATION     PROGRESS NOTE   Subjective/Complaints: Patient seen lying in bed this morning. She indicates he slept well overnight. He denies complaints.  ROS: Limited due to language, but appears to deny CP, SOB, nausea, vomiting, diarrhea..  Objective: Vital Signs: Blood pressure (!) 173/45, pulse 79, temperature 98 F (36.7 C), temperature source Oral, resp. rate 20, height 5' (1.524 m), weight 69.6 kg (153 lb 7 oz), SpO2 98 %. No results found. No results for input(s): WBC, HGB, HCT, PLT in the last 72 hours. Recent Labs    09/10/17 0528  NA 141  K 4.2  CL 103  GLUCOSE 117*  BUN 30*  CREATININE 0.96  CALCIUM 9.4   CBG (last 3)  Recent Labs    09/09/17 1629 09/09/17 2049 09/10/17 0621  GLUCAP 114* 119* 116*    Wt Readings from Last 3 Encounters:  09/06/17 69.6 kg (153 lb 7 oz)  09/02/17 70.8 kg (156 lb 1.4 oz)    Physical Exam:  Constitutional: She appears well-developed and well-nourished. No distress.  HENT: Normocephalic and atraumatic.  Eyes: EOMI. No discharge.  Cardiovascular: RRR. No JVD  Respiratory: CTA Bilaterally. Normal effort.  GI: Bowel sounds are normal. She exhibits no distension.  Musculoskeletal: She exhibits no edema or tenderness.  Neurological: She is alert.  Right facial weakness with right inattention.     Speech dysarthric     Had difficulty following simple motor commands (?language) Motor: Right upper extremity: Shoulder abduction 1+/5, distally 0/5.   Right lower extremity: 4/5 proximal to distal  Left upper/left lower extremity:  Grossly 5/5.   Skin: Skin is warm and dry. She is not diaphoretic.  Psychiatric: Limited due to language  Assessment/Plan: 1.  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 Physiatrist is providing close team supervision and 24  hour management of active medical problems listed below. Physiatrist and rehab team continue to assess barriers to discharge/monitor patient progress toward functional and medical goals.  Function:  Bathing Bathing position   Position: Shower  Bathing parts Body parts bathed by patient: Chest, Abdomen, Front perineal area, Right upper leg, Left upper leg, Right arm Body parts bathed by helper: Left arm, Buttocks  Bathing assist        Upper Body Dressing/Undressing Upper body dressing   What is the patient wearing?: Pull over shirt/dress     Pull over shirt/dress - Perfomed by patient: Thread/unthread left sleeve, Put head through opening, Pull shirt over trunk Pull over shirt/dress - Perfomed by helper: Thread/unthread right sleeve        Upper body assist Assist Level: (Mod A)      Lower Body Dressing/Undressing Lower body dressing   What is the patient wearing?: Pants, Socks, Shoes       Pants- Performed by helper: Thread/unthread right pants leg, Thread/unthread left pants leg, Pull pants up/down   Non-skid slipper socks- Performed by helper: Don/doff right sock, Don/doff left sock Socks - Performed by patient: Don/doff left sock   Shoes - Performed by patient: Don/doff right shoe, Don/doff left shoe, Fasten right, Fasten left         TED Hose - Performed by helper: Don/doff right TED hose, Don/doff left TED hose  Lower body assist Assist for lower body dressing: (Total A)      Toileting Toileting Toileting activity did not occur: No  continent bowel/bladder event   Toileting steps completed by helper: Adjust clothing prior to toileting, Adjust clothing after toileting, Performs perineal hygiene Toileting Assistive Devices: Grab bar or rail  Toileting assist Assist level: Two helpers   Transfers Chair/bed transfer   Chair/bed transfer method: Stand pivot Chair/bed transfer assist level: Moderate assist (Pt 50 - 74%/lift or lower) Chair/bed transfer  assistive device: Armrests     Locomotion Ambulation     Max distance: 30 ft Assist level: Maximal assist (Pt 25 - 49%)   Wheelchair Wheelchair activity did not occur: (unable secondary to height) Type: Manual Max wheelchair distance: 150 ft Assist Level: Dependent (Pt equals 0%)  Cognition Comprehension Comprehension assist level: Understands basic 50 - 74% of the time/ requires cueing 25 - 49% of the time  Expression Expression assist level: Expresses basic 25 - 49% of the time/requires cueing 50 - 75% of the time. Uses single words/gestures.  Social Interaction Social Interaction assist level: Interacts appropriately 75 - 89% of the time - Needs redirection for appropriate language or to initiate interaction.  Problem Solving Problem solving assist level: Solves basic 50 - 74% of the time/requires cueing 25 - 49% of the time  Memory Memory assist level: Recognizes or recalls 25 - 49% of the time/requires cueing 50 - 75% of the time   Medical Problem List and Plan: 1.  Functional, cognitive and mobility deficits  secondary to hemorrhagic left basal ganglia infarct    Cont CIR   WHO, ROM 2.  DVT Prophylaxis/Anticoagulation: Mechanical: Sequential compression devices, below knee Bilateral lower extremities   Dopplers neg for DVT 3. Pain Management: N/A 4. Mood: LCSW to follow for evaluation and support as mentation improves.  5. Neuropsych: This patient is not capable of making decisions on her own behalf. 6. Skin/Wound Care: routine pressure relief measures.  7. Fluids/Electrolytes/Nutrition: Encourage oral intake.   BMP within acceptable limits on 12/19 8. HTN: Monitor BP qid.    Lisinopril increased to 20 mg BID on 12/19   Cont HCTZ and metoprolol. Monitor renal status while on nectars.   Relatively controlled on 12/19 9. Leukocytosis: Resolved 10 Dysphagia: Continue dysphagia 2, nectar liquids.Monitor hydration status with routine checks.    Advance diet as tolerated 11.  Dyslipidemia: On Lipitor  12. T2DM: Hgb A1C-7.1.  Monitor BS ac/hs. Resume metformin.  CBG (last 3)  Recent Labs    09/09/17 1629 09/09/17 2049 09/10/17 0621  GLUCAP 114* 119* 116*    Relatively controlled on 12/19  LOS (Days) 4 A FACE TO FACE EVALUATION WAS PERFORMED  Garrick Midgley Karis JubaAnil Eddith Mentor, MD 09/10/2017 9:14 AM

## 2017-09-10 NOTE — Evaluation (Signed)
Recreational Therapy Assessment and Plan  Patient Details  Name: Cindy Thornton MRN: [id number] Date of Birth: 11-03-1950 Today's Date: 09/10/2017  Rehab Potential: Good ELOS: 3 weeks   Assessment   Problem List:      Patient Active Problem List   Diagnosis Date Noted  . ICH (intracerebral hemorrhage) ([place]) 09/06/2017  . Leukocytosis   . Dysphagia, post-stroke   . Benign essential HTN   . Diabetes mellitus type 2 in obese ([place])   . 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 Diabetes mellitus without complication ([place])   . Hyperlipidemia   . Hypertension    Past Surgical History: History reviewed. No pertinent surgical history.  Assessment & Plan Clinical Impression: Patient is a 66 y.o. year old female with history of T2DM, HTN who was admitted on 09/02/17 after being found by family with left sided weakness, right facial droop and difficulty speaking.History taken from chart review and son.CT headreviewed, showing left basal ganglia hemorrhage with surrounding edema and mild regional mass effect. Dr. Leonie Man felt to be hypertensive in nature and she was started on nicardipine drip. MRI brain done revealing hemorrhage in left basal ganglia infarct, advanced chronic small vessel disease and chronic left parietal infarct. 2D echo showed EF 60-65% with moderate LVH, mild aortic stenosis and trivial MVR. CTA head/neck done today revealing no evidence of increase in bleeding, aortic atherosclerosis and 20% stenosis of proximal L-SA. MBS done 12/12 and she was placed on dysphagia 1, nectar liquids. Mentation improving but verbal output with gibberish and severe dysarthria. Patient with resultant right sided weakness with sensory deficits as well as severe dysarthria with minimal breath support--question aphasia. CIR recommended by therapy team due to functional decline. Patient transferred to CIR on  09/06/2017 .   Pt presents with decreased activity tolerance, decreased functional mobility, decreased balance, decreased coordination Limiting pt's independence with leisure/community pursuits.  Leisure History/Participation Premorbid leisure interest/current participation: Crafts - Knitting/Crocheting Other Leisure Interests: Television;Cooking/Baking;Housework Leisure Participation Style: With Family/Friends Awareness of Community Resources: Good-identify 3 post discharge leisure resources Psychosocial / Spiritual Spiritual Interests: Church Patient agreeable to Pet Therapy: Yes Does patient have pets?: Yes(5 doves) Social interaction - Mood/Behavior: Cooperative Academic librarian Appropriate for Education?: Yes Strengths/Weaknesses Patient Strengths/Abilities: Willingness to participate Patient weaknesses: Physical limitations;Minimal Premorbid Leisure Activity TR Patient demonstrates impairments in the following area(s): Endurance;Motor;Pain  Plan Rec Therapy Plan Is patient appropriate for Therapeutic Recreation?: Yes Rehab Potential: Good Treatment times per week: Min 1 TR session/group >25 minutes during LOS  Estimated Length of Stay: 3 weeks TR Treatment/Interventions: Adaptive equipment instruction;Group participation (Comment);Therapeutic exercise;Wheelchair propulsion/positioning;1:1 session;Community reintegration;Recreation/leisure participation;UE/LE Chartered certified accountant training;Functional mobility training;Patient/family education;Therapeutic activities  Recommendations for other services: None   Discharge Criteria: Patient will be discharged from TR if patient refuses treatment 3 consecutive times without medical reason.  If treatment goals not met, if there is a change in medical status, if patient makes no progress towards goals or if patient is discharged from hospital.  The above assessment, treatment plan, treatment alternatives and  goals were discussed and mutually agreed upon: by patient  Ehrenfeld 09/10/2017, 12:30 PM

## 2017-09-10 NOTE — Progress Notes (Signed)
Physical Therapy Session Note  Patient Details  Name: Cindy Thornton MRN: [id number] Date of Birth: 1951/06/14  Today's Date: 09/10/2017 PT Individual Time: 1140-1200 PT Individual Time Calculation (min): 20 min   Short Term Goals: Week 1:  PT Short Term Goal 1 (Week 1): Pt will ambulate x 50 ft with mod assist and LRAD PT Short Term Goal 2 (Week 1): Pt will perform w/c propulsion with min assist PT Short Term Goal 3 (Week 1): Pt will maintain dynamic standing balance without UE support with mod assist  Skilled Therapeutic Interventions/Progress Updates:    Patient in bathroom with nursing upon entry.  RN reporting pt fell earlier with therapy and now with +2 A.  Patient in w/c after toileting and educated on w/c management with L UE and LE and performed with min A x 80'.  In parallel bars in gym performed sit to stand with mod to min A x 5 reps with standing weight shift R/L with assist at R knee to prevent buckling.  Patient assisted to room I w/c and left with grandaughter and all needs in reach.   Therapy Documentation Precautions:  Precautions Precautions: Fall Precaution Comments: Rt hemi, Rt inattention, interpretor needs (Spanish)  Restrictions Weight Bearing Restrictions: No General: PT Amount of Missed Time (min): 10 Minutes PT Missed Treatment Reason: Nursing care Vital Signs:  Pain: Pain Assessment Pain Assessment: No/denies pain   See Function Navigator for Current Functional Status.   Therapy/Group: Individual Therapy  Elray McgregorCynthia Doloris Servantes 09/10/2017, 6:12 PM

## 2017-09-10 NOTE — Progress Notes (Signed)
Physical Therapy Note  Patient Details  Name: Cindy Thornton MRN: [id number] Date of Birth: 08/06/1951 Today's Date: 09/10/2017  0900-1000, 60 min individual tx Pain: "sore neck" due to uncomfortable position in bed with HOB raises; PT informed Bri, NT  Translator here, but communication still difficult.  Pt communicated that she wanted to use toilet to void.  Bed mobility with min/mod assist.  Pt sat EOB x 3 minutes with supervision.  Stand pivot bed> w/c to R with max assist, R knee buckling.  Stand pivot w/c> toilet using wall bar with max assist, RLE scissoring and difficult for PT to see due to pt's body habitus.  PT assisted pt to slide to floor, sitting in long sitting wtihout difficutly.   Pt still stated she wanted to sit on toilet.  Jamie and BisonJasmine, NTs assisted.pt to sit on toilet, where she voided continently.  Stedy + 2 assist Misty Stanley(Lisa, Rec therapist)  toilet> w/c.  +2 assist for standing at sink to wash face.  Discussed safety re: oral care with granddaughter who wanted to help pt brush teeth .  She expressed understanding.  Pt left resting in w/c in care of granddaughter.  Pt left resting in w/c with quick release belt applied and all needs within reach.   [name] 09/10/2017, 7:55 AM

## 2017-09-10 NOTE — Progress Notes (Signed)
Was notified by secretary "pt on floor in bathroom." There staff in the bathroom with pt: therapy, and two techs. No additional bruising, nor pt did not hit head. Nurse reassess fall risk, pt 2 assist with steady remains.

## 2017-09-10 NOTE — Progress Notes (Addendum)
Fall Assessment:  Fall occurred RM [id number] Notified P. Love, PA of patient's fall.  Meet with pt/g-daughter Hydrographic surveyor(Writer, Bri, and [place]) Pt was being assisted by therapy in bathroom. Pt stated she was too heavy for therapy to handle her by herself.  Pt suggest  2 people to assist at this time. Staff suggest gait belt will be in place at time of transfer Not a repeated fall  Language barrier is another barrier, so staff will have interpreter to assist with communication. Encourage and monitor pt's RLE is positioned appropriated before transfer.   Staff will continue to refer to Pt's safety plan in the room, communicate effective during shift change, and other interdiscplinary team. Assess pt for tiredness/weakness before any transfers. Include pt in making decision, if she comfortable with one or two persons assist.

## 2017-09-10 NOTE — Progress Notes (Signed)
Speech Language Pathology Daily Session Note  Patient Details  Name: Cindy Thornton MRN: [id number] Date of Birth: [birth year]  Today's Date: 09/10/2017 SLP Individual Time: 1100-1130 SLP Individual Time Calculation (min): 30 min  Short Term Goals: Week 1: SLP Short Term Goal 1 (Week 1): Pt will follow 2 step simple directions with Min A cues to achieve ~ 75% accuracy.  SLP Short Term Goal 2 (Week 1): Pt will utilize an increased vocal intensity at the word level to achieve increased intelligibility of ~ 75% with Mod A cues.  SLP Short Term Goal 3 (Week 1): Pt will answer yes/no questions about current physical and aphasic condition to demonstrate intellectual awareness with ~ 75% accuracy and Mod A cues.  SLP Short Term Goal 4 (Week 1): Pt will complete rote/automatic verbal sequences with Max A cues.  SLP Short Term Goal 5 (Week 1): Pt will produce 2 to 3 word utterances during structured verbal tasks with Max A cues.  SLP Short Term Goal 6 (Week 1): Pt will consume thin liquids and dysphagia 3 with minimal overt s/s of aspiration and Min A cues for use of compensatory strategies.   Skilled Therapeutic Interventions:  Pt was seen for skilled ST targeting communication goals.  SLP facilitated the session with a basic sequencing task to address verbal expression of 2-3 word utterances.   Pt needed max assist for organization to sequence picture cards accurately but was able to describe basic actions in pictures with mod assist multimodal cues to recognize and correct verbal errors.  Pt was also able to coordinate respiration with onset of phonation in ~25% of opportunities to achieve intelligibility with max assist demonstration cues.  Pt was returned to room and left in wheelchair with granddaughter at bedside.  Continue per current plan of care.    Function:  Eating Eating                 Cognition Comprehension Comprehension assist level: Understands basic 50 - 74% of the time/  requires cueing 25 - 49% of the time  Expression   Expression assist level: Expresses basic 25 - 49% of the time/requires cueing 50 - 75% of the time. Uses single words/gestures.  Social Interaction    Problem Solving Problem solving assist level: Solves basic 50 - 74% of the time/requires cueing 25 - 49% of the time  Memory Memory assist level: Recognizes or recalls 25 - 49% of the time/requires cueing 50 - 75% of the time    Pain Pain Assessment Pain Assessment: No/denies pain  Therapy/Group: Individual Therapy  Mattthew Ziomek, [name] 09/10/2017, 1:45 PM

## 2017-09-10 NOTE — Plan of Care (Signed)
  Coping: Will identify appropriate support needs 09/10/2017 1127 - Progressing by Pollyann SavoyLloyd, Paxon Propes D, RN   Coping: Will verbalize positive feelings about self 09/10/2017 1127 - Progressing by Pollyann SavoyLloyd, Kinzleigh Kandler D, RN Pt notes she will feel better, if staff will assist her with transfer x2 people

## 2017-09-10 NOTE — Progress Notes (Signed)
Occupational Therapy Session Note  Patient Details  Name: Cindy Thornton MRN: [id number] Date of Birth: Mar 14, 1951  Today's Date: 09/10/2017 OT Individual Time: 1300-1413 OT Individual Time Calculation (min): 73 min    Short Term Goals: Week 1:  OT Short Term Goal 1 (Week 1): Pt will complete UB dressing with Min A OT Short Term Goal 2 (Week 1): Pt will complete 1/3 components of donning pants OT Short Term Goal 3 (Week 1): Pt will complete toileting tasks with 1 helper  Skilled Therapeutic Interventions/Progress Updates:    Upon entering the room, pt seated in wheelchair with interpreter present in room. Pt with no c/o,signs, or symptoms of pain this session. [facility]. Pt engaged in dynavision task while seated unsupported. Bottom half of board on and pt reaching across midline and laterally to locate all targets with L hand. Pt able to hit 79 targets within 3 minutes. Pt required increased time and min A as time progressed to return to midline after reaching laterally. Pt fatiguing quickly and needing multiple rest breaks. Pt oriented to self only this session and needing max cues for orientation. Pt also asked to recall ingredients and sequence for making her favorite meal. Pt required mod verbal guidance cues for task. Pt returning to room at end of session and propelling self with min A and hemiplegic technique for 75'. Pt remained in room in wheelchair with quick release belt donned and call bell within reach.   Therapy Documentation Precautions:  Precautions Precautions: Fall Precaution Comments: Rt hemi, Rt inattention, interpretor needs (Spanish)  Restrictions Weight Bearing Restrictions: No General:   Vital Signs: Therapy Vitals Temp: (!) 97.5 F (36.4 C)(patient was off unit. RN notified) Temp Source: Oral Pulse Rate: 82 Resp: 20 BP: (!) 138/57 Patient Position (if appropriate): Sitting Oxygen Therapy SpO2: 99  % O2 Device: Not Delivered Pain: Pain Assessment Pain Assessment: No/denies pain ADL: ADL ADL Comments: Please see functional navigator for ADL status  See Function Navigator for Current Functional Status.   Therapy/Group: Individual Therapy  Alen BleacherBradsher, Hussain Maimone P 09/10/2017, 2:39 PM

## 2017-09-11 ENCOUNTER — Inpatient Hospital Stay (HOSPITAL_COMMUNITY): Payer: Self-pay

## 2017-09-11 ENCOUNTER — Inpatient Hospital Stay (HOSPITAL_COMMUNITY): Payer: Self-pay | Admitting: Occupational Therapy

## 2017-09-11 ENCOUNTER — Inpatient Hospital Stay (HOSPITAL_COMMUNITY): Payer: Self-pay | Admitting: Physical Therapy

## 2017-09-11 ENCOUNTER — Inpatient Hospital Stay (HOSPITAL_COMMUNITY): Payer: Self-pay | Admitting: Speech Pathology

## 2017-09-11 LAB — GLUCOSE, CAPILLARY
GLUCOSE-CAPILLARY: 99 mg/dL (ref 65–99)
GLUCOSE-CAPILLARY: 102 mg/dL — AB (ref 65–99)
GLUCOSE-CAPILLARY: 107 mg/dL — AB (ref 65–99)
GLUCOSE-CAPILLARY: 155 mg/dL — AB (ref 65–99)

## 2017-09-11 NOTE — Progress Notes (Signed)
Occupational Therapy Session Note  Patient Details  Name: Cindy Thornton MRN: [id number] Date of Birth: [birth year]  Today's Date: 09/11/2017 OT Individual Time: 1300-1345 OT Individual Time Calculation (min): 45 min    Short Term Goals: Week 1:  OT Short Term Goal 1 (Week 1): Pt will complete UB dressing with Min A OT Short Term Goal 2 (Week 1): Pt will complete 1/3 components of donning pants OT Short Term Goal 3 (Week 1): Pt will complete toileting tasks with 1 helper  Skilled Therapeutic Interventions/Progress Updates:    Pt presents seated in w/c in room, agreeable to OT tx session. Transported Pt total assist via w/c for energy conservation to therapy gym. Pt engages in RUE AA/PROM across all planes. Completed toe tapping and LE marching seated in w/c with manual facilitation for R hip flexion, max multimodal cues for Pt to understand and correctly complete activity. Attempted use of peg board activity for cognitive challenge and to facilitate scanning towards R visual field. Pt with significant difficulty completing task, requiring max verbal instructional cues. Decreased challenge of activity by having Pt locate and obtain specific colored pieces from peg board for further assessment of Pt's visual field with Pt able to complete with increased accuracy. Pt left seated in w/c in therapy gym with handoff to PT for start of session.   Therapy Documentation Precautions:  Precautions Precautions: Fall Precaution Comments: Rt hemi, Rt inattention, interpretor needs (Spanish)  Restrictions Weight Bearing Restrictions: No  Pain: Pain Assessment Pain Assessment: No/denies pain ADL: ADL ADL Comments: Please see functional navigator for ADL status  See Function Navigator for Current Functional Status.   Therapy/Group: Individual Therapy  Orlando PennerBreanna L Keavon Sensing 09/11/2017, 4:03 PM

## 2017-09-11 NOTE — Progress Notes (Signed)
Speech Language Pathology Daily Session Note  Patient Details  Name: Cindy Thornton MRN: [id number] Date of Birth: [birth year]  Today's Date: 09/11/2017 SLP Individual Time: 1100-1200 SLP Individual Time Calculation (min): 60 min  Short Term Goals: Week 1: SLP Short Term Goal 1 (Week 1): Pt will follow 2 step simple directions with Min A cues to achieve ~ 75% accuracy.  SLP Short Term Goal 2 (Week 1): Pt will utilize an increased vocal intensity at the word level to achieve increased intelligibility of ~ 75% with Mod A cues.  SLP Short Term Goal 3 (Week 1): Pt will answer yes/no questions about current physical and aphasic condition to demonstrate intellectual awareness with ~ 75% accuracy and Mod A cues.  SLP Short Term Goal 4 (Week 1): Pt will complete rote/automatic verbal sequences with Max A cues.  SLP Short Term Goal 5 (Week 1): Pt will produce 2 to 3 word utterances during structured verbal tasks with Max A cues.  SLP Short Term Goal 6 (Week 1): Pt will consume thin liquids and dysphagia 3 with minimal overt s/s of aspiration and Min A cues for use of compensatory strategies.   Skilled Therapeutic Interventions:  Pt was seen for skilled ST targeting goals for speech and swallowing.  Pt needed max assist verbal cues to identify and verbally describe safety concerns in pictures due to decreased safety awareness and word finding deficits.  Pt consumed dys 2 textures and nectar thick liquids on her lunch tray with min assist verbal cues to utilize liquid wash to clear solids from the oral cavity.  Pt had very prolonged mastication of solids and intake was minimal.  Pt's family reported that the food in the hospital is not what pt typically eats at home.  SLP discussed foods that pt's family could bring from home and provided them with a list of dys 2 textures.  No overt s/s of aspiration were evident with solids or thickened liquids.  Pt was returned to room and left in wheelchair with family  at bedside.  Continue per current plan of care.    Function:  Eating Eating   Modified Consistency Diet: Yes Eating Assist Level: Supervision or verbal cues;Set up assist for   Eating Set Up Assist For: Opening containers       Cognition Comprehension Comprehension assist level: Understands basic 50 - 74% of the time/ requires cueing 25 - 49% of the time  Expression Expression assistive device: Other (Comment)(interpreter) Expression assist level: Expresses basic 50 - 74% of the time/requires cueing 25 - 49% of the time. Needs to repeat parts of sentences.  Social Interaction Social Interaction assist level: Interacts appropriately 50 - 74% of the time - May be physically or verbally inappropriate.  Problem Solving Problem solving assist level: Solves basic 50 - 74% of the time/requires cueing 25 - 49% of the time  Memory Memory assist level: Recognizes or recalls 25 - 49% of the time/requires cueing 50 - 75% of the time    Pain Pain Assessment Pain Assessment: No/denies pain  Therapy/Group: Individual Therapy  Imogene Gravelle, [name] 09/11/2017, 2:17 PM

## 2017-09-11 NOTE — Progress Notes (Signed)
Physical Therapy Note  Patient Details  Name: Cindy Thornton MRN: [id number] Date of Birth: 13-Jul-1951 Today's Date: 09/11/2017    Time: 900-954 54 minutes  1:1 No c/o pain.  Supine to sit with mod A.  Sitting balance edge of bed to take meds with close supervision. squat pivot transfers mod A.  Standing posture, balance, wt shifts with focus on Rt LE control and stability in parallel bars with manual facilitation for pelvic alignment and Rt knee and hip control.  Sit to stand with focus on slow controlled movements without UE support with mod/max A, improving with repetition.  Gait in parallel bars 6' fwd, 6' bckwd x 2 with mod A, improved posture noted.  Rt UE NMR with AAROM for shoulder internal rotation and elbow flexion with trace mm activation palpated.  Pt propel w/c with hemi technique with min A in controlled environments.  Pt left in w/c in room with needs at hand.   Shian Goodnow 09/11/2017, 9:57 AM

## 2017-09-11 NOTE — Progress Notes (Signed)
[facility] PHYSICAL MEDICINE & REHABILITATION     PROGRESS NOTE   Subjective/Complaints: Pt seen laying in bed this AM.  She slept well overnight. No improvement in RUE.   ROS: Limited due to language, but appears to deny CP, SOB, nausea, vomiting, diarrhea..  Objective: Vital Signs: Blood pressure (!) 128/42, pulse 76, temperature 98.5 F (36.9 C), temperature source Oral, resp. rate 19, height 5' (1.524 m), weight 69.6 kg (153 lb 7 oz), SpO2 97 %. No results found. No results for input(s): WBC, HGB, HCT, PLT in the last 72 hours. Recent Labs    09/10/17 0528  NA 141  K 4.2  CL 103  GLUCOSE 117*  BUN 30*  CREATININE 0.96  CALCIUM 9.4   CBG (last 3)  Recent Labs    09/10/17 1624 09/10/17 2111 09/11/17 0642  GLUCAP 108* 99 99    Wt Readings from Last 3 Encounters:  09/06/17 69.6 kg (153 lb 7 oz)  09/02/17 70.8 kg (156 lb 1.4 oz)    Physical Exam:  Constitutional: She appears well-developed and well-nourished. No distress.  HENT: Normocephalic and atraumatic.  Eyes: EOMI. No discharge.  Cardiovascular: RRR. No JVD  Respiratory: CTA Bilaterally. Normal effort.  GI: Bowel sounds are normal. She exhibits no distension.  Musculoskeletal: She exhibits no edema or tenderness.  Neurological: She is alert.  Right facial weakness with right inattention.     Speech dysarthric     Had difficulty following simple motor commands (?language) Motor: Right upper extremity: Shoulder abduction 1+/5, distally 0/5.   Right lower extremity: ?4/5 proximal to distal  Left upper/left lower extremity:  Grossly 5/5.   Skin: Skin is warm and dry. She is not diaphoretic.  Psychiatric: Limited due to language  Assessment/Plan: 1.  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 Physiatrist is providing close team supervision and 24 hour management of  active medical problems listed below. Physiatrist and rehab team continue to assess barriers to discharge/monitor patient progress toward functional and medical goals.  Function:  Bathing Bathing position   Position: Shower  Bathing parts Body parts bathed by patient: Chest, Abdomen, Front perineal area, Right upper leg, Left upper leg, Right arm Body parts bathed by helper: Left arm, Buttocks  Bathing assist        Upper Body Dressing/Undressing Upper body dressing   What is the patient wearing?: Pull over shirt/dress     Pull over shirt/dress - Perfomed by patient: Thread/unthread left sleeve, Put head through opening, Pull shirt over trunk Pull over shirt/dress - Perfomed by helper: Thread/unthread right sleeve        Upper body assist Assist Level: (Mod A)      Lower Body Dressing/Undressing Lower body dressing   What is the patient wearing?: Pants, Socks, Shoes       Pants- Performed by helper: Thread/unthread right pants leg, Thread/unthread left pants leg, Pull pants up/down   Non-skid slipper socks- Performed by helper: Don/doff right sock, Don/doff left sock Socks - Performed by patient: Don/doff left sock   Shoes - Performed by patient: Don/doff right shoe, Don/doff left shoe, Fasten right, Fasten left         TED Hose - Performed by helper: Don/doff right TED hose, Don/doff left TED hose  Lower body assist Assist for lower body dressing: (Total A)      Toileting Toileting Toileting activity did not occur:  No continent bowel/bladder event   Toileting steps completed by helper: Adjust clothing prior to toileting, Adjust clothing after toileting, Performs perineal hygiene Toileting Assistive Devices: Grab bar or rail  Toileting assist Assist level: Two helpers   Transfers Chair/bed transfer   Chair/bed transfer method: Stand pivot Chair/bed transfer assist level: Moderate assist (Pt 50 - 74%/lift or lower) Chair/bed transfer assistive device:  Armrests     Locomotion Ambulation     Max distance: 30 ft Assist level: Maximal assist (Pt 25 - 49%)   Wheelchair Wheelchair activity did not occur: (unable secondary to height) Type: Manual Max wheelchair distance: 80 Assist Level: Touching or steadying assistance (Pt > 75%)  Cognition Comprehension Comprehension assist level: Understands basic 50 - 74% of the time/ requires cueing 25 - 49% of the time  Expression Expression assist level: Expresses basic 25 - 49% of the time/requires cueing 50 - 75% of the time. Uses single words/gestures.  Social Interaction Social Interaction assist level: Interacts appropriately 75 - 89% of the time - Needs redirection for appropriate language or to initiate interaction.  Problem Solving Problem solving assist level: Solves basic 50 - 74% of the time/requires cueing 25 - 49% of the time  Memory Memory assist level: Recognizes or recalls 25 - 49% of the time/requires cueing 50 - 75% of the time   Medical Problem List and Plan: 1.  Functional, cognitive and mobility deficits  secondary to hemorrhagic left basal ganglia infarct    Cont CIR   WHO, ROM 2.  DVT Prophylaxis/Anticoagulation: Mechanical: Sequential compression devices, below knee Bilateral lower extremities   Dopplers neg for DVT 3. Pain Management: N/A 4. Mood: LCSW to follow for evaluation and support as mentation improves.  5. Neuropsych: This patient is not capable of making decisions on her own behalf. 6. Skin/Wound Care: routine pressure relief measures.  7. Fluids/Electrolytes/Nutrition: Encourage oral intake.   BMP within acceptable limits on 12/19 8. HTN: Monitor BP qid.    Lisinopril increased to 20 mg BID on 12/19   Cont HCTZ and metoprolol.    Monitor renal status while on nectars.   Relatively controlled on 12/19   Controlled on 12/20 9. Leukocytosis: Resolved 10 Dysphagia: Continue dysphagia 2, nectar liquids.Monitor hydration status with routine checks.    Advance  diet as tolerated 11. Dyslipidemia: On Lipitor  12. T2DM: Hgb A1C-7.1.  Monitor BS ac/hs. Resume metformin.  CBG (last 3)  Recent Labs    09/10/17 1624 09/10/17 2111 09/11/17 0642  GLUCAP 108* 99 99    Relatively controlled on 12/20  LOS (Days) 5 A FACE TO FACE EVALUATION WAS PERFORMED  Lindsee Labarre Karis JubaAnil Tanveer Brammer, MD 09/11/2017 11:02 AM

## 2017-09-11 NOTE — Progress Notes (Signed)
Physical Therapy Note  Patient Details  Name: Cindy Thornton MRN: [id number] Date of Birth: 05/15/1951 Today's Date: 09/11/2017  1350-1420, 30 min individual tx Pain: none per pt  Interpreter Cindy Thornton present.   Tx focused on w/c mobility in ultra hemi ht wc, using hemi method on level tile, also weaving in/out of cones, and avoiding a line of cones on R. Despite demo and many cues, pt never turned head to scan to her R side.  Therapeutic activity at w/c level level for R attention, pointing to cones on her R with L hand, and using L hand to convey cones to Cindy Thornton, standing on her R side.  Pt spontaneously turned her head to R to achieve this.   Pt left resting in w/c with quick release belt applied and all needs within reach. PT re-educated pt about call bell.  When presented to her L of midline she noticed it and indicated the nurse call button.   See function navigator for current status.  Nalleli Largent 09/11/2017, 2:33 PM

## 2017-09-12 ENCOUNTER — Inpatient Hospital Stay (HOSPITAL_COMMUNITY): Payer: Self-pay | Admitting: Speech Pathology

## 2017-09-12 ENCOUNTER — Inpatient Hospital Stay (HOSPITAL_COMMUNITY): Payer: Self-pay | Admitting: Occupational Therapy

## 2017-09-12 ENCOUNTER — Inpatient Hospital Stay (HOSPITAL_COMMUNITY): Payer: Self-pay | Admitting: Physical Therapy

## 2017-09-12 ENCOUNTER — Inpatient Hospital Stay (HOSPITAL_COMMUNITY): Payer: Self-pay

## 2017-09-12 DIAGNOSIS — I10 Essential (primary) hypertension: Secondary | ICD-10-CM

## 2017-09-12 LAB — GLUCOSE, CAPILLARY
GLUCOSE-CAPILLARY: 107 mg/dL — AB (ref 65–99)
GLUCOSE-CAPILLARY: 139 mg/dL — AB (ref 65–99)
Glucose-Capillary: 125 mg/dL — ABNORMAL HIGH (ref 65–99)
Glucose-Capillary: 115 mg/dL — ABNORMAL HIGH (ref 65–99)

## 2017-09-12 NOTE — Progress Notes (Signed)
Occupational Therapy Session Note  Patient Details  Name: Cindy Thornton MRN: [id number] Date of Birth: 09/16/1951  Today's Date: 09/12/2017 OT Individual Time: 0900-1000 OT Individual Time Calculation (min): 60 min    Short Term Goals: Week 1:  OT Short Term Goal 1 (Week 1): Pt will complete UB dressing with Min A OT Short Term Goal 2 (Week 1): Pt will complete 1/3 components of donning pants OT Short Term Goal 3 (Week 1): Pt will complete toileting tasks with 1 helper  Skilled Therapeutic Interventions/Progress Updates:      Pt seen for BADL retraining of toileting, bathing, and dressing with a focus on initiating use of RUE, postural control and standing.  Pt received in bed and sat to EOB with mod A and guided A on how to use LUE to support RUE.  Pt sat at EOB for several minutes demonstrating good static sit balance.  She stood with Stedy to be transferred to toilet and then to tub bench. With stedy and using L side for support she was able to hold static balance without A.  Pt needs A with clothing management.  In shower worked on R hand wt bearing on tub bench as she wt shifted to her R to cleanse L side of bottom. She did actively elevate her shoulder and slight retraction of shoulder.  She was able to actively lift R leg to get foot on stedy pad and to place in pant leg.  For donning pants, pt's w/c was placed next to sink and she used her L hand on sink for support as she stood with mod A.  Pt overall demonstrated good improvement in RUE awareness,tone and RUE AROM and postural control. Pt resting in w/c with quick release belt on and call light in reach.  Therapy Documentation Precautions:  Precautions Precautions: Fall Precaution Comments: Rt hemi, Rt inattention, interpretor needs (Spanish)  Restrictions Weight Bearing Restrictions: No  Pain: Pain Assessment Pain Assessment: No/denies pain ADL: ADL ADL Comments: Please see functional navigator for ADL status  See  Function Navigator for Current Functional Status.   Therapy/Group: Individual Therapy  Kerra Guilfoil 09/12/2017, 11:34 AM

## 2017-09-12 NOTE — Progress Notes (Signed)
Physical Therapy Note  Patient Details  Name: Cindy Thornton MRN: [id number] Date of Birth: 1951-03-27 Today's Date: 09/12/2017    Time: 1055-1150 55 minutes  1:1 No c/o pain.  Pt able to propel w/c with supervision in home and controlled environments with min cuing for Rt attention 2 x 50'.  Sit to stand training at parallel bars with focus on midline stand, Rt LE stability and control with pt requiring max A for pelvic and Rt LE alignment, mod manual facilitation for midline posture.  Gait at railing in hallway 2 x 25' with mod A, improved wt shifts with manual facilitation, emerging Rt LE knee flexor and hip adductor tone.  Supine bridging, LTR and PNF all for continued core, hip and LE strength, coordination and stability with pt requiring frequent rests due to fatigue at end of session.  Pt left in w/c in room with quick release belt donned   [name] 09/12/2017, 12:52 PM

## 2017-09-12 NOTE — Progress Notes (Signed)
Occupational Therapy Session Note  Patient Details  Name: Cindy Thornton MRN: [id number] Date of Birth: 11/24/1950  Today's Date: 09/12/2017 OT Individual Time: 1345-1415 OT Individual Time Calculation (min): 30 min    Short Term Goals: Week 1:  OT Short Term Goal 1 (Week 1): Pt will complete UB dressing with Min A OT Short Term Goal 2 (Week 1): Pt will complete 1/3 components of donning pants OT Short Term Goal 3 (Week 1): Pt will complete toileting tasks with 1 helper  Skilled Therapeutic Interventions/Progress Updates:    1:1. Pt seated in w/c ready for tx with interpreter present. OT propels w/c to/from all tx destinations. At high low table with LUE on arm skate for weight bearing and NMR, pt able to complete protraction/retraction with trace activation, horizonal ab/adduction and elbow flexion/extension with min A for full range achievement, and circles in B directions with MOD A for full/smooth motion. Exited session with pt seated in w/c with call light in reach, all needs met and QRB donned Therapy Documentation Precautions:  Precautions Precautions: Fall Precaution Comments: Rt hemi, Rt inattention, interpretor needs (Spanish)  Restrictions Weight Bearing Restrictions: No  See Function Navigator for Current Functional Status.   Therapy/Group: Individual Therapy  Tonny Branch 09/12/2017, 2:12 PM

## 2017-09-12 NOTE — Progress Notes (Signed)
Speech Language Pathology Daily Session Note  Patient Details  Name: Cindy Thornton MRN: [id number] Date of Birth: 24-May-1951  Today's Date: 09/12/2017 SLP Individual Time: 1300-1345 SLP Individual Time Calculation (min): 45 min  Short Term Goals: Week 1: SLP Short Term Goal 1 (Week 1): Pt will follow 2 step simple directions with Min A cues to achieve ~ 75% accuracy.  SLP Short Term Goal 2 (Week 1): Pt will utilize an increased vocal intensity at the word level to achieve increased intelligibility of ~ 75% with Mod A cues.  SLP Short Term Goal 3 (Week 1): Pt will answer yes/no questions about current physical and aphasic condition to demonstrate intellectual awareness with ~ 75% accuracy and Mod A cues.  SLP Short Term Goal 4 (Week 1): Pt will complete rote/automatic verbal sequences with Max A cues.  SLP Short Term Goal 5 (Week 1): Pt will produce 2 to 3 word utterances during structured verbal tasks with Max A cues.  SLP Short Term Goal 6 (Week 1): Pt will consume thin liquids and dysphagia 3 with minimal overt s/s of aspiration and Min A cues for use of compensatory strategies.   Skilled Therapeutic Interventions:  Pt was seen for skilled ST targeting speech and swallowing goals.  Given pt's ongoing respiratory deficits and their impact on her communication and swallowing function, SLP assessed pt for RMST.  Both pt's IMST and EMST were well below normal limits (17 and 26 respectively); however,  pt needed mod-max assist instructional cues for sequencing and coordination when using manometer.  SLP initiated EMST with device set at lowest resistance which pt was unable to utilize despite max assist instructional cues.    Pt was able to return demonstration of incentive spirometer to encourage deep breathing for improved respiratory drive for speech and swallowing with min-mod assist verbal cues for 10 repetitions.  SLP is hopeful that as pt regains strength and endurance that she will be able  to progress to an RMST exercise program but for now recommend use of incentive spirometer for reasons mentioned above.  Pt consumed therapeutic trials of thin liquids to continue working towards diet progression with pt demonstrating no overt s/s of aspiration when using chin tuck posture or when head was in neutral position.  Recommend initiating the water protocol.  Pt was returned to room and left in wheelchair, handed off to OT to being therapy session.  Continue per current plan of care.     Function:  Eating Eating   Modified Consistency Diet: Yes Eating Assist Level: Supervision or verbal cues           Cognition Comprehension Comprehension assist level: Understands basic 50 - 74% of the time/ requires cueing 25 - 49% of the time  Expression   Expression assist level: Expresses basic 50 - 74% of the time/requires cueing 25 - 49% of the time. Needs to repeat parts of sentences.  Social Interaction Social Interaction assist level: Interacts appropriately 75 - 89% of the time - Needs redirection for appropriate language or to initiate interaction.  Problem Solving Problem solving assist level: Solves basic 50 - 74% of the time/requires cueing 25 - 49% of the time  Memory Memory assist level: Recognizes or recalls 25 - 49% of the time/requires cueing 50 - 75% of the time    Pain Pain Assessment Pain Assessment: No/denies pain  Therapy/Group: Individual Therapy  Cindy Thornton, Cindy SpryNicole Thornton 09/12/2017, 3:50 PM

## 2017-09-12 NOTE — Progress Notes (Signed)
Social Work Patient ID: Cindy Thornton, female   DOB: May 26, 1951, 66 y.o.   MRN: [id number]   Met with family yesterday to review team conference information.  Aware of targeted 09/30/17 and minimal assistance goals.  Continue to follow for discharge planning and support needs.  Jadan Hinojos, LCSW

## 2017-09-12 NOTE — Patient Care Conference (Signed)
Inpatient RehabilitationTeam Conference and Plan of Care Update Date: 09/10/2017   Time: 11:40 AM    Patient Name: Cindy Thornton      Medical Record Number: [id number]  Date of Birth: [birth year] Sex: Female         Room/Bed: 4W19C/4W19C-01 Payor Info: Payor: /    Admitting Diagnosis: CVA  Admit Date/Time:  09/06/2017  3:56 PM Admission Comments: No comment available   Primary Diagnosis:  <principal problem not specified> Principal Problem: <principal problem not specified>  Patient Active Problem List   Diagnosis Date Noted  . Diabetes mellitus type 2 in nonobese (HCC)   . Right hemiparesis (HCC)   . 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 ICH (intracerebral hemorrhage) (HCC) 09/06/2017  . Leukocytosis   . Dysphagia, post-stroke   . Benign essential HTN   . Diabetes mellitus type 2 in obese (HCC)   . 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 Maryla MorrowAnkit Patel Social Worker Present: Amada JupiterLucy Aanshi Batchelder, LCSW Nurse Present: Kennyth ArnoldStacey Jennings, RN PT Present: Wanda Plumparoline Cook, PT OT Present: Primitivo GauzeJulia Saguier, OT SLP Present: Jackalyn LombardNicole Page, SLP     Current Status/Progress Goal Weekly Team Focus  Medical   Functional, cognitive and mobility deficits  secondary to hemorrhagic left basal ganglia infarct  Improve mobility, transfers, HTN, DM, dysphagia  See above   Bowel/Bladder   continent of bowel and bladder last BM 09/09/17  continent of b/b min assist  free of constipation  normal bowel bladder function   Swallow/Nutrition/ Hydration   dys 2, nectar thick liquids; trials of thin liquids following oral care with full staff supervision   mod I   trials of advanced textures to continue working towards progression    ADL's   MaxA LB self-care; ModA UB self-care; MaxA stand pivot transfers (min-modA using  Stedy)  MinA overall  ADL retraining; RUE NMR; functional transfers; Pt/family education    Mobility   mod/max A transfers, max A to stand, pre gait  min A overall  NMR, transfer training   Communication   mod-max assist   min assist   naming, intelligibility, coordination of respiration with phonation   Safety/Cognition/ Behavioral Observations  decreased awareness of verbal errors, mod-max    min assist   continue to address recognition and correction of errors    Pain   denies any pain   pain less than or equal to 2/10  remains free of pain, relief of pain with current plan of care   Skin   ecchymosis on abdomen and wrists  skin intact bruises healing  bruises healing no skin breakdown    Rehab Goals Patient on target to meet rehab goals: Yes *See Care Plan and progress notes for long and short-term goals.     Barriers to Discharge  Current Status/Progress Possible Resolutions Date Resolved   Physician    Medical stability     See above  Therapies, optimize HTN/DM meds, advance diet as tolerated      Nursing                  PT                    OT                  SLP  SW                Discharge Planning/Teaching Needs:  Pt to d/c to daughter's home [facility]([name]) and she will provide 24/7 assistance.  Teaching to be planned closer to d/c.   Team Discussion:  Medically staable;  Had assisted fall this morning.  Can require as much as max assist overall;  Apraxic.  Unaware of right leg and very unstable. Body size is a major barrier.  Aphasic; mod-max cues for sommunication and poor self awareness of deficits.  Goals set for min assist overall.  Revisions to Treatment Plan:  None    Continued Need for Acute Rehabilitation Level of Care: The patient requires daily medical management by a physician with specialized training in physical medicine and rehabilitation for the following conditions: Daily direction of a multidisciplinary physical rehabilitation  program to ensure safe treatment while eliciting the highest outcome that is of practical value to the patient.: Yes Daily medical management of patient stability for increased activity during participation in an intensive rehabilitation regime.: Yes Daily analysis of laboratory values and/or radiology reports with any subsequent need for medication adjustment of medical intervention for : Neurological problems;Diabetes problems;Blood pressure problems;Nutritional problems  Cindy Thornton 09/12/2017, 8:54 AM

## 2017-09-12 NOTE — Progress Notes (Signed)
[place] PHYSICAL MEDICINE & REHABILITATION     PROGRESS NOTE   Subjective/Complaints: Patient seen lying in bed this morning. She indicates she slept well overnight.  ROS: Ltd. due to language, but appears to deny CP, SOB, nausea, vomiting, diarrhea..  Objective: Vital Signs: Blood pressure (!) 156/52, pulse 75, temperature 97.7 F (36.5 C), temperature source Oral, resp. rate 18, height 5' (1.524 m), weight 69.6 kg (153 lb 7 oz), SpO2 96 %. No results found. No results for input(s): WBC, HGB, HCT, PLT in the last 72 hours. Recent Labs    09/10/17 0528  NA 141  K 4.2  CL 103  GLUCOSE 117*  BUN 30*  CREATININE 0.96  CALCIUM 9.4   CBG (last 3)  Recent Labs    09/11/17 2121 09/12/17 0636 09/12/17 1147  GLUCAP 155* 125* 107*    Wt Readings from Last 3 Encounters:  09/06/17 69.6 kg (153 lb 7 oz)  09/02/17 70.8 kg (156 lb 1.4 oz)    Physical Exam:  Constitutional: She appears well-developed and well-nourished. No distress.  HENT: Normocephalic and atraumatic.  Eyes: EOMI. No discharge.  Cardiovascular: RRR. No JVD  Respiratory: CTA Bilaterally. Normal effort.  GI: Bowel sounds are normal. She exhibits no distension.  Musculoskeletal: She exhibits no edema or tenderness.  Neurological: She is alert.  Right facial weakness with right inattention.     Speech dysarthric     Has difficulty following simple motor commands (?language) Motor: Right upper extremity: Shoulder abduction 1+/5, distally 0/5 (stable).   Right lower extremity: ?4/5 proximal to distal  Left upper/left lower extremity:  Grossly 5/5.   Skin: Skin is warm and dry. She is not diaphoretic.  Psychiatric: Limited due to language  Assessment/Plan: 1.  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 Physiatrist is providing close team supervision and 24 hour  management of active medical problems listed below. Physiatrist and rehab team continue to assess barriers to discharge/monitor patient progress toward functional and medical goals.  Function:  Bathing Bathing position   Position: Shower  Bathing parts Body parts bathed by patient: Chest, Abdomen, Front perineal area, Right upper leg, Left upper leg, Right arm, Left lower leg, Right lower leg Body parts bathed by helper: Left arm, Back, Buttocks  Bathing assist        Upper Body Dressing/Undressing Upper body dressing   What is the patient wearing?: Pull over shirt/dress     Pull over shirt/dress - Perfomed by patient: Thread/unthread left sleeve, Put head through opening, Pull shirt over trunk Pull over shirt/dress - Perfomed by helper: Thread/unthread right sleeve        Upper body assist Assist Level: (Mod A)      Lower Body Dressing/Undressing Lower body dressing   What is the patient wearing?: Pants, Socks, Shoes     Pants- Performed by patient: Thread/unthread left pants leg Pants- Performed by helper: Thread/unthread right pants leg, Pull pants up/down   Non-skid slipper socks- Performed by helper: Don/doff right sock, Don/doff left sock Socks - Performed by patient: Don/doff left sock Socks - Performed by helper: Don/doff right sock, Don/doff left sock Shoes - Performed by patient: (incorrect entry on 09/08/17, should have been helper A) Shoes - Performed by helper: Don/doff right shoe, Don/doff left shoe, Fasten right, Fasten left       TED Hose - Performed by helper: Don/doff right TED hose,  Don/doff left TED hose  Lower body assist Assist for lower body dressing: (Total A)      Toileting Toileting Toileting activity did not occur: No continent bowel/bladder event Toileting steps completed by patient: Performs perineal hygiene(urinated only) Toileting steps completed by helper: Adjust clothing prior to toileting, Adjust clothing after toileting Toileting  Assistive Devices: Grab bar or rail  Toileting assist Assist level: (used Stedy)   Transfers Chair/bed transfer   Chair/bed transfer method: Stand pivot Chair/bed transfer assist level: Moderate assist (Pt 50 - 74%/lift or lower) Chair/bed transfer assistive device: Armrests     Locomotion Ambulation     Max distance: 30 ft Assist level: Maximal assist (Pt 25 - 49%)   Wheelchair Wheelchair activity did not occur: (unable secondary to height) Type: Manual Max wheelchair distance: 100 Assist Level: Touching or steadying assistance (Pt > 75%)  Cognition Comprehension Comprehension assist level: Understands basic 50 - 74% of the time/ requires cueing 25 - 49% of the time  Expression Expression assist level: Expresses basic 50 - 74% of the time/requires cueing 25 - 49% of the time. Needs to repeat parts of sentences.  Social Interaction Social Interaction assist level: Interacts appropriately 50 - 74% of the time - May be physically or verbally inappropriate.  Problem Solving Problem solving assist level: Solves basic 50 - 74% of the time/requires cueing 25 - 49% of the time  Memory Memory assist level: Recognizes or recalls 25 - 49% of the time/requires cueing 50 - 75% of the time   Medical Problem List and Plan: 1.  Functional, cognitive and mobility deficits  secondary to hemorrhagic left basal ganglia infarct    Cont CIR   WHO, ROM 2.  DVT Prophylaxis/Anticoagulation: Mechanical: Sequential compression devices, below knee Bilateral lower extremities   Dopplers neg for DVT 3. Pain Management: N/A 4. Mood: LCSW to follow for evaluation and support as mentation improves.  5. Neuropsych: This patient is not capable of making decisions on her own behalf. 6. Skin/Wound Care: routine pressure relief measures.  7. Fluids/Electrolytes/Nutrition: Encourage oral intake.   BMP within acceptable limits on 12/19   Labs ordered for Monday 8. HTN: Monitor BP qid.    Lisinopril increased to 20  mg BID on 12/19   Cont HCTZ and metoprolol.    Monitor renal status while on nectars.   Relatively controlled on 12/19   Slightly labile, but overall controlled on 12/21 9. Leukocytosis: Resolved 10 Dysphagia: Continue dysphagia 2, nectar liquids.Monitor hydration status with routine checks.    Advance diet as tolerated 11. Dyslipidemia: On Lipitor  12. T2DM: Hgb A1C-7.1.  Monitor BS ac/hs. Resume metformin.  CBG (last 3)  Recent Labs    09/11/17 2121 09/12/17 0636 09/12/17 1147  GLUCAP 155* 125* 107*    Relatively controlled on 12/21  LOS (Days) 6 A FACE TO FACE EVALUATION WAS PERFORMED  Legacy Lacivita Karis JubaAnil Albert Devaul, MD 09/12/2017 12:53 PM

## 2017-09-13 ENCOUNTER — Inpatient Hospital Stay (HOSPITAL_COMMUNITY): Payer: Self-pay | Admitting: Occupational Therapy

## 2017-09-13 LAB — GLUCOSE, CAPILLARY
GLUCOSE-CAPILLARY: 152 mg/dL — AB (ref 65–99)
GLUCOSE-CAPILLARY: 95 mg/dL (ref 65–99)
GLUCOSE-CAPILLARY: 127 mg/dL — AB (ref 65–99)
Glucose-Capillary: 112 mg/dL — ABNORMAL HIGH (ref 65–99)

## 2017-09-13 MED ORDER — CHLORTHALIDONE 25 MG PO TABS
25.0000 mg | ORAL_TABLET | Freq: Every day | ORAL | Status: DC
  Administered 2017-09-14 – 2017-09-30 (×17): 25 mg via ORAL
  Filled 2017-09-13 (×18): qty 1

## 2017-09-13 NOTE — Progress Notes (Signed)
[facility] PHYSICAL MEDICINE & REHABILITATION     PROGRESS NOTE   Subjective/Complaints: Patient seen lying in bed this morning. She states she slept well overnight. She denies complaints.  ROS: Ltd. due to language, but appears to deny CP, SOB, nausea, vomiting, diarrhea..  Objective: Vital Signs: Blood pressure (!) 146/50, pulse 80, temperature 98 F (36.7 C), temperature source Oral, resp. rate 16, height 5' (1.524 m), weight 69.6 kg (153 lb 7 oz), SpO2 97 %. No results found. No results for input(s): WBC, HGB, HCT, PLT in the last 72 hours. No results for input(s): NA, K, CL, GLUCOSE, BUN, CREATININE, CALCIUM in the last 72 hours.  Invalid input(s): CO CBG (last 3)  Recent Labs    09/12/17 2120 09/13/17 0629 09/13/17 1135  GLUCAP 139* 112* 152*    Wt Readings from Last 3 Encounters:  09/06/17 69.6 kg (153 lb 7 oz)  09/02/17 70.8 kg (156 lb 1.4 oz)    Physical Exam:  Constitutional: She appears well-developed and well-nourished. No distress.  HENT: Normocephalic and atraumatic.  Eyes: EOMI. No discharge.  Cardiovascular: RRR. No JVD  Respiratory: CTA Bilaterally. Normal effort.  GI: Bowel sounds are normal. She exhibits no distension.  Musculoskeletal: She exhibits no edema or tenderness.  Neurological: She is alert.  Right facial weakness with right inattention.     Speech dysarthric     Has difficulty following simple motor commands (?language) Motor: Right upper extremity: Shoulder abduction 1+/5, distally 0/5 (unchanged).   Right lower extremity: ?4/5 proximal to distal  Left upper/left lower extremity:  Grossly 5/5.   Skin: Skin is warm and dry. She is not diaphoretic.  Psychiatric: Limited due to language  Assessment/Plan: 1.  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 Physiatrist is providing close team supervision  and 24 hour management of active medical problems listed below. Physiatrist and rehab team continue to assess barriers to discharge/monitor patient progress toward functional and medical goals.  Function:  Bathing Bathing position   Position: Shower  Bathing parts Body parts bathed by patient: Chest, Abdomen, Front perineal area, Right upper leg, Left upper leg, Right arm, Left lower leg, Right lower leg Body parts bathed by helper: Left arm, Back, Buttocks  Bathing assist        Upper Body Dressing/Undressing Upper body dressing   What is the patient wearing?: Pull over shirt/dress     Pull over shirt/dress - Perfomed by patient: Thread/unthread left sleeve, Put head through opening, Pull shirt over trunk Pull over shirt/dress - Perfomed by helper: Thread/unthread right sleeve        Upper body assist Assist Level: (Mod A)      Lower Body Dressing/Undressing Lower body dressing   What is the patient wearing?: Pants, Socks, Shoes     Pants- Performed by patient: Thread/unthread left pants leg Pants- Performed by helper: Thread/unthread right pants leg, Pull pants up/down   Non-skid slipper socks- Performed by helper: Don/doff right sock, Don/doff left sock Socks - Performed by patient: Don/doff left sock Socks - Performed by helper: Don/doff right sock, Don/doff left sock Shoes - Performed by patient: (incorrect entry on 09/08/17, should have been helper A) Shoes - Performed by helper: Don/doff right shoe, Don/doff left shoe, Fasten right, Fasten left       TED Hose - Performed by helper: Don/doff right TED hose, Don/doff left TED hose  Lower body  assist Assist for lower body dressing: (Total A)      Toileting Toileting Toileting activity did not occur: No continent bowel/bladder event Toileting steps completed by patient: Performs perineal hygiene(urinated only) Toileting steps completed by helper: Adjust clothing prior to toileting, Performs perineal hygiene, Adjust  clothing after toileting Toileting Assistive Devices: Grab bar or rail  Toileting assist Assist level: (used Stedy)   Transfers Chair/bed transfer   Chair/bed transfer method: Stand pivot Chair/bed transfer assist level: Moderate assist (Pt 50 - 74%/lift or lower) Chair/bed transfer assistive device: Armrests     Locomotion Ambulation     Max distance: 30 ft Assist level: Maximal assist (Pt 25 - 49%)   Wheelchair Wheelchair activity did not occur: (unable secondary to height) Type: Manual Max wheelchair distance: 100 Assist Level: Touching or steadying assistance (Pt > 75%)  Cognition Comprehension Comprehension assist level: Understands basic 50 - 74% of the time/ requires cueing 25 - 49% of the time  Expression Expression assist level: Expresses basic 50 - 74% of the time/requires cueing 25 - 49% of the time. Needs to repeat parts of sentences.  Social Interaction Social Interaction assist level: Interacts appropriately 50 - 74% of the time - May be physically or verbally inappropriate.  Problem Solving Problem solving assist level: Solves basic 50 - 74% of the time/requires cueing 25 - 49% of the time  Memory Memory assist level: Recognizes or recalls 25 - 49% of the time/requires cueing 50 - 75% of the time   Medical Problem List and Plan: 1.  Functional, cognitive and mobility deficits  secondary to hemorrhagic left basal ganglia infarct    Cont CIR   WHO, ROM 2.  DVT Prophylaxis/Anticoagulation: Mechanical: Sequential compression devices, below knee Bilateral lower extremities   Dopplers neg for DVT 3. Pain Management: N/A 4. Mood: LCSW to follow for evaluation and support as mentation improves.  5. Neuropsych: This patient is not capable of making decisions on her own behalf. 6. Skin/Wound Care: routine pressure relief measures.  7. Fluids/Electrolytes/Nutrition: Encourage oral intake.   BMP within acceptable limits on 12/19   Labs ordered for Monday 8. HTN: Monitor BP  qid.    Lisinopril increased to 20 mg BID on 12/19   HCTZ DC'd and chlorthalidone started on 12/23   Cont metoprolol.    Monitor renal status while on nectars. 9. Leukocytosis: Resolved 10 Dysphagia: Continue dysphagia 2, nectar liquids.Monitor hydration status with routine checks.    Advance diet as tolerated 11. Dyslipidemia: On Lipitor  12. T2DM: Hgb A1C-7.1.  Monitor BS ac/hs. Resume metformin.  CBG (last 3)  Recent Labs    09/12/17 2120 09/13/17 0629 09/13/17 1135  GLUCAP 139* 112* 152*    Relatively controlled on 12/22  LOS (Days) 7 A FACE TO FACE EVALUATION WAS PERFORMED  Lilyrose Tanney Karis JubaAnil Meziah Blasingame, MD 09/13/2017 12:06 PM

## 2017-09-13 NOTE — Progress Notes (Signed)
Occupational Therapy Session Note  Patient Details  Name: Cindy Thornton MRN: [id number] Date of Birth: [birth year]  Today's Date: 09/13/2017 OT Individual Time: 0801-0901 OT Individual Time Calculation (min): 60 min    Short Term Goals: Week 1:  OT Short Term Goal 1 (Week 1): Pt will complete UB dressing with Min A OT Short Term Goal 2 (Week 1): Pt will complete 1/3 components of donning pants OT Short Term Goal 3 (Week 1): Pt will complete toileting tasks with 1 helper  Skilled Therapeutic Interventions/Progress Updates:    Pt in supported long sitting in bed with NA present eating breakfast. Pt required additional 15 mins to complete breakfast with OT S for safety.  No s/s aspiration, min cues to locate items on R side of tray.  Pt seen for skilled ADL retraining with interpreter present to improve functional safety/I with BADLs.  Pt transitioned to sitting EOB with Min A to reposition R hip and RUE with increased time (HOB elevated).  Pt reported need to toilet, pt stood in STEDY with Min A for toilet transfer.  Total A to manage clothing and perform perineal care following loose BM.  Pt donned pants with total A at toilet d/t time constraints.  Pt transferred to w/c with STEDY, Min A to stand from toilet.  Pt completed oral care with set up of supplies and multimodal cues to locate items on R side of sink.  HOH assist provided to incorporate RUE into grooming tasks to hold toothpaste/toothbrush.  Pt doffed shirt with increased time and cueing for hemi-technique with SBA.  With assist to orient shirt, pt threaded RUE with verbal cueing for hemi strategies, threaded LUE and donned overhead.  Increased cueing required to attend to shirt gathered around R shoulder, no improvements noted with mirror used for visual fieldback.  Min A overall to adjust shirt around R side.  Pt remained seated in w/c with QRB in place and all needs met upon OT departure.  Therapy Documentation Precautions:   Precautions Precautions: Fall Precaution Comments: Rt hemi, Rt inattention, interpretor needs (Spanish)  Restrictions Weight Bearing Restrictions: No Pain: Pain Assessment Pain Assessment: No/denies pain ADL: ADL ADL Comments: Please see functional navigator for ADL status  See Function Navigator for Current Functional Status.   Therapy/Group: Individual Therapy  Marcella Dubs 09/13/2017, 12:06 PM

## 2017-09-14 LAB — GLUCOSE, CAPILLARY
GLUCOSE-CAPILLARY: 114 mg/dL — AB (ref 65–99)
GLUCOSE-CAPILLARY: 122 mg/dL — AB (ref 65–99)
GLUCOSE-CAPILLARY: 133 mg/dL — AB (ref 65–99)
Glucose-Capillary: 166 mg/dL — ABNORMAL HIGH (ref 65–99)

## 2017-09-14 NOTE — Progress Notes (Signed)
[facility]     PROGRESS NOTE   Subjective/Complaints: Pt seen laying in bed this AM.  She slept well overnight.  No improvement in RUE.  ROS: Limited due to language, but appears to deny CP, SOB, nausea, vomiting, diarrhea..  Objective: Vital Signs: Blood pressure (!) 150/52, pulse 77, temperature 98.1 F (36.7 C), temperature source Oral, resp. rate 18, height 5' (1.524 m), weight 69.6 kg (153 lb 7 oz), SpO2 98 %. No results found. No results for input(s): WBC, HGB, HCT, PLT in the last 72 hours. No results for input(s): NA, K, CL, GLUCOSE, BUN, CREATININE, CALCIUM in the last 72 hours.  Invalid input(s): CO CBG (last 3)  Recent Labs    09/13/17 2145 09/14/17 0638 09/14/17 1136  GLUCAP 127* 114* 166*    Wt Readings from Last 3 Encounters:  09/06/17 69.6 kg (153 lb 7 oz)  09/02/17 70.8 kg (156 lb 1.4 oz)    Physical Exam:  Constitutional: She appears well-developed and well-nourished. No distress.  HENT: Normocephalic and atraumatic.  Eyes: EOMI. No discharge.  Cardiovascular: RRR. No JVD  Respiratory: CTA Bilaterally. Normal effort.  GI: Bowel sounds are normal. She exhibits no distension.  Musculoskeletal: She exhibits no edema or tenderness.  Neurological: She is alert.  Right facial weakness with right inattention.     Speech dysarthric     Has difficulty following simple motor commands (?language) Motor: Right upper extremity: Shoulder abduction 1+/5, distally 0/5 (stable).   Right lower extremity: ?4/5 proximal to distal  Left upper/left lower extremity:  Grossly 5/5.   Skin: Skin is warm and dry. She is not diaphoretic.  Psychiatric: Limited due to language  Assessment/Plan: 1.  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 Physiatrist is providing close team supervision and 24 hour  management of active medical problems listed below. Physiatrist and rehab team continue to assess barriers to discharge/monitor patient progress toward functional and medical goals.  Function:  Bathing Bathing position   Position: Shower  Bathing parts Body parts bathed by patient: Chest, Abdomen, Front perineal area, Right upper leg, Left upper leg, Right arm, Left lower leg, Right lower leg Body parts bathed by helper: Left arm, Back, Buttocks  Bathing assist        Upper Body Dressing/Undressing Upper body dressing   What is the patient wearing?: Pull over shirt/dress     Pull over shirt/dress - Perfomed by patient: Thread/unthread left sleeve, Put head through opening, Pull shirt over trunk Pull over shirt/dress - Perfomed by helper: Thread/unthread right sleeve        Upper body assist Assist Level: (Mod A)      Lower Body Dressing/Undressing Lower body dressing   What is the patient wearing?: Pants, Socks, Shoes     Pants- Performed by patient: Thread/unthread left pants leg Pants- Performed by helper: Thread/unthread right pants leg, Pull pants up/down   Non-skid slipper socks- Performed by helper: Don/doff right sock, Don/doff left sock Socks - Performed by patient: Don/doff left sock Socks - Performed by helper: Don/doff right sock, Don/doff left sock Shoes - Performed by patient: (incorrect entry on 09/08/17, should have been helper A) Shoes - Performed by helper: Don/doff right shoe, Don/doff left shoe, Fasten right, Fasten left       TED Hose - Performed by helper: Don/doff right TED hose, Don/doff left TED hose  Lower  body assist Assist for lower body dressing: (Total A)      Toileting Toileting Toileting activity did not occur: No continent bowel/bladder event Toileting steps completed by patient: Performs perineal hygiene(urinated only) Toileting steps completed by helper: Adjust clothing prior to toileting, Performs perineal hygiene, Adjust clothing  after toileting Toileting Assistive Devices: Grab bar or rail  Toileting assist Assist level: (used Stedy)   Transfers Chair/bed transfer   Chair/bed transfer method: Stand pivot Chair/bed transfer assist level: Moderate assist (Pt 50 - 74%/lift or lower) Chair/bed transfer assistive device: Armrests     Locomotion Ambulation     Max distance: 30 ft Assist level: Maximal assist (Pt 25 - 49%)   Wheelchair Wheelchair activity did not occur: (unable secondary to height) Type: Manual Max wheelchair distance: 100 Assist Level: Touching or steadying assistance (Pt > 75%)  Cognition Comprehension Comprehension assist level: Understands basic 50 - 74% of the time/ requires cueing 25 - 49% of the time  Expression Expression assist level: Expresses basic 50 - 74% of the time/requires cueing 25 - 49% of the time. Needs to repeat parts of sentences.  Social Interaction Social Interaction assist level: Interacts appropriately 50 - 74% of the time - May be physically or verbally inappropriate.  Problem Solving Problem solving assist level: Solves basic 50 - 74% of the time/requires cueing 25 - 49% of the time  Memory Memory assist level: Recognizes or recalls 50 - 74% of the time/requires cueing 25 - 49% of the time   Medical Problem List and Plan: 1.  Functional, cognitive and mobility deficits  secondary to hemorrhagic left basal ganglia infarct    Cont CIR   WHO, ROM 2.  DVT Prophylaxis/Anticoagulation: Mechanical: Sequential compression devices, below knee Bilateral lower extremities   Dopplers neg for DVT 3. Pain Management: N/A 4. Mood: LCSW to follow for evaluation and support as mentation improves.  5. Neuropsych: This patient is not capable of making decisions on her own behalf. 6. Skin/Wound Care: routine pressure relief measures.  7. Fluids/Electrolytes/Nutrition: Encourage oral intake.   BMP within acceptable limits on 12/19   Labs ordered for tomorrow 8. HTN: Monitor BP qid.     Lisinopril increased to 20 mg BID on 12/19   HCTZ DC'd and chlorthalidone started on 12/23   Cont metoprolol.    Monitor renal status while on nectars.   Will consider further adjustments tomorrow 9. Leukocytosis: Resolved 10 Dysphagia: Continue dysphagia 2, nectar liquids.Monitor hydration status with routine checks.    Advance diet as tolerated 11. Dyslipidemia: On Lipitor  12. T2DM: Hgb A1C-7.1.  Monitor BS ac/hs. Resume metformin.  CBG (last 3)  Recent Labs    09/13/17 2145 09/14/17 0638 09/14/17 1136  GLUCAP 127* 114* 166*    Relatively controlled on 12/23  LOS (Days) 8 A FACE TO FACE EVALUATION WAS PERFORMED  Daejah Klebba Karis JubaAnil Zeva Leber, MD 09/14/2017 3:25 PM

## 2017-09-15 ENCOUNTER — Inpatient Hospital Stay (HOSPITAL_COMMUNITY): Payer: Self-pay | Admitting: Speech Pathology

## 2017-09-15 ENCOUNTER — Inpatient Hospital Stay (HOSPITAL_COMMUNITY): Payer: Self-pay

## 2017-09-15 ENCOUNTER — Inpatient Hospital Stay (HOSPITAL_COMMUNITY): Payer: Self-pay | Admitting: Occupational Therapy

## 2017-09-15 LAB — CBC WITH DIFFERENTIAL/PLATELET
BASOS PCT: 0 %
Basophils Absolute: 0 10*3/uL (ref 0.0–0.1)
EOS ABS: 0.2 10*3/uL (ref 0.0–0.7)
Eosinophils Relative: 3 %
HCT: 39.6 % (ref 36.0–46.0)
HEMOGLOBIN: 13.1 g/dL (ref 12.0–15.0)
LYMPHS ABS: 2.9 10*3/uL (ref 0.7–4.0)
Lymphocytes Relative: 32 %
MCH: 28.7 pg (ref 26.0–34.0)
MCHC: 33.1 g/dL (ref 30.0–36.0)
MCV: 86.8 fL (ref 78.0–100.0)
MONO ABS: 0.6 10*3/uL (ref 0.1–1.0)
MONOS PCT: 7 %
NEUTROS PCT: 58 %
Neutro Abs: 5.4 10*3/uL (ref 1.7–7.7)
Platelets: 184 10*3/uL (ref 150–400)
RBC: 4.56 MIL/uL (ref 3.87–5.11)
RDW: 13.1 % (ref 11.5–15.5)
WBC: 9.2 10*3/uL (ref 4.0–10.5)

## 2017-09-15 LAB — BASIC METABOLIC PANEL
Anion gap: 8 (ref 5–15)
BUN: 23 mg/dL — AB (ref 6–20)
CALCIUM: 9.8 mg/dL (ref 8.9–10.3)
CHLORIDE: 104 mmol/L (ref 101–111)
CO2: 27 mmol/L (ref 22–32)
CREATININE: 0.9 mg/dL (ref 0.44–1.00)
GFR calc non Af Amer: >60 mL/min (ref >60)
GLUCOSE: 132 mg/dL — AB (ref 65–99)
Potassium: 4.1 mmol/L (ref 3.5–5.1)
Sodium: 139 mmol/L (ref 135–145)

## 2017-09-15 LAB — GLUCOSE, CAPILLARY
GLUCOSE-CAPILLARY: 115 mg/dL — AB (ref 65–99)
GLUCOSE-CAPILLARY: 175 mg/dL — AB (ref 65–99)
Glucose-Capillary: 132 mg/dL — ABNORMAL HIGH (ref 65–99)
Glucose-Capillary: 137 mg/dL — ABNORMAL HIGH (ref 65–99)

## 2017-09-15 NOTE — Progress Notes (Signed)
Occupational Therapy Session Note  Patient Details  Name: Cindy Thornton MRN: [id number] Date of Birth: 18-Mar-1951  Today's Date: 09/15/2017 OT Individual Time: 1110-1210 OT Individual Time Calculation (min): 60 min    Short Term Goals: Week 1:  OT Short Term Goal 1 (Week 1): Pt will complete UB dressing with Min A OT Short Term Goal 2 (Week 1): Pt will complete 1/3 components of donning pants OT Short Term Goal 3 (Week 1): Pt will complete toileting tasks with 1 helper  Skilled Therapeutic Interventions/Progress Updates:    Pt seen for ADL training with a focus on R leg stability, postural control in standing, and adaptive techniques.  Pt received in bed and requested toileting and shower.  Mod A bed mobility and min A with Stedy from bed to toilet to tub bench to wc.   She stands well with knee support from the stedy pads.  Standing without the stedy at the sink for dressing pulling pants over hips and brushing teeth, max A to support R knee from buckling. Improved hemi dressing techniques with improved awareness of R UE.  Pt did cough just after brushing her teeth, even though she seemed to spit all of the water out.  She stated she felt that some of the water "went down the wrong way".  After coughing and clearing her throat she was able to take 5 small sips of regular water.  Pt resting in chair with quick release belt on and interpretor in room with her.    Therapy Documentation Precautions:  Precautions Precautions: Fall Precaution Comments: Rt hemi, Rt inattention, interpretor needs (Spanish)  Restrictions Weight Bearing Restrictions: No  Pain: Pain Assessment Pain Assessment: No/denies pain ADL: ADL ADL Comments: Please see functional navigator for ADL status   See Function Navigator for Current Functional Status.   Therapy/Group: Individual Therapy  Christl Fessenden 09/15/2017, 12:37 PM

## 2017-09-15 NOTE — Progress Notes (Signed)
Occupational Therapy Weekly Progress Note  Patient Details  Name: Cindy Thornton MRN: [id number] Date of Birth: 1951/09/19  Beginning of progress report period: September 07, 2017 End of progress report period: September 15, 2017    Patient has met 1 of 3 short term goals.  Pt is progressing with the 2 goals she did not meet. For example she progress from max to mod A with donning a shirt but is not yet able to don with min A.  She progressed with toileting to be able to cleanse front perineal area but is not able to reach bottom.  She is progressing with her use of adaptive strategies, AROM of RLE and trace development of active movement in RUE, and sitting balance.  Pt is highly motivated and does put in good effort.  Patient continues to demonstrate the following deficits: decreased cardiorespiratoy endurance, abnormal tone and unbalanced muscle activation, decreased attention to right, decreased memory and decreased standing balance, decreased postural control, hemiplegia and decreased balance strategies and therefore will continue to benefit from skilled OT intervention to enhance overall performance with BADL.  Patient progressing toward long term goals..  Continue plan of care.  OT Short Term Goals Week 1:  OT Short Term Goal 1 (Week 1): Pt will complete UB dressing with Min A OT Short Term Goal 1 - Progress (Week 1): Progressing toward goal OT Short Term Goal 2 (Week 1): Pt will complete 1/3 components of donning pants OT Short Term Goal 2 - Progress (Week 1): Met OT Short Term Goal 3 (Week 1): Pt will complete toileting tasks with 1 helper OT Short Term Goal 3 - Progress (Week 1): Progressing toward goal Week 2:  OT Short Term Goal 1 (Week 2): Pt will be able to stand at toilet with mod A to support balance to allow her to cleanse and manage clothing. OT Short Term Goal 2 (Week 2): Pt will be able to don shirt with min A. OT Short Term Goal 3 (Week 2): Pt will be able to transfer on  and off toilet with mod A of 1.       Therapy Documentation Precautions:  Precautions Precautions: Fall Precaution Comments: Rt hemi, Rt inattention, interpretor needs (Spanish)  Restrictions Weight Bearing Restrictions: No  ADL: ADL ADL Comments: Please see functional navigator for ADL status  See Function Navigator for Current Functional Status.   Buchanan 09/15/2017, 12:45 PM

## 2017-09-15 NOTE — Progress Notes (Signed)
Speech Language Pathology Weekly Progress and Session Note  Patient Details  Name: Cindy Thornton MRN: [id number] Date of Birth: [birth year]  Beginning of progress report period: September 08, 2017 End of progress report period: September 15, 2017  Today's Date: 09/15/2017 SLP Individual Time: 0900-1000 SLP Individual Time Calculation (min): 60 min  Short Term Goals: Week 1: SLP Short Term Goal 1 (Week 1): Pt will follow 2 step simple directions with Min A cues to achieve ~ 75% accuracy.  SLP Short Term Goal 1 - Progress (Week 1): Not met SLP Short Term Goal 2 (Week 1): Pt will utilize an increased vocal intensity at the word level to achieve increased intelligibility of ~ 75% with Mod A cues.  SLP Short Term Goal 2 - Progress (Week 1): Met SLP Short Term Goal 3 (Week 1): Pt will answer yes/no questions about current physical and aphasic condition to demonstrate intellectual awareness with ~ 75% accuracy and Mod A cues.  SLP Short Term Goal 3 - Progress (Week 1): Met SLP Short Term Goal 4 (Week 1): Pt will complete rote/automatic verbal sequences with Max A cues.  SLP Short Term Goal 4 - Progress (Week 1): Met SLP Short Term Goal 5 (Week 1): Pt will produce 2 to 3 word utterances during structured verbal tasks with Max A cues.  SLP Short Term Goal 5 - Progress (Week 1): Met SLP Short Term Goal 6 (Week 1): Pt will consume thin liquids and dysphagia 3 with minimal overt s/s of aspiration and Min A cues for use of compensatory strategies.  SLP Short Term Goal 6 - Progress (Week 1): Not met    New Short Term Goals: Week 2: SLP Short Term Goal 1 (Week 2): Pt will follow 2 step simple directions with Min A cues to achieve ~ 75% accuracy.  SLP Short Term Goal 2 (Week 2): Pt will utilize an increased vocal intensity at the word level to achieve increased intelligibility of ~ 90% with Min A cues.  SLP Short Term Goal 3 (Week 2): Pt will answer yes/no questions about current physical and aphasic  condition to demonstrate intellectual awareness with ~ 75% accuracy and Min A cues.  SLP Short Term Goal 4 (Week 2): Pt will complete rote/automatic verbal sequences with Mod A cues.  SLP Short Term Goal 5 (Week 2): Pt will produce 2 to 3 word utterances during structured verbal tasks with Mod A cues.  SLP Short Term Goal 6 (Week 2): Pt will consume thin liquids and dysphagia 3 with minimal overt s/s of aspiration and Min A cues for use of compensatory strategies.   Weekly Progress Updates:Pt has made good progress during skilled ST sessions and as a result she has met 3 out of 6 STGs. Pt has increase utterance length and increase word finding abilities in native language. Pt with increased receptive language as evidenced by ability to answer yes/no questions and follow 2 step directions with decreased cues. Pt continues to require skilled ST to increase pt's ability to express wants and needs and well as consume least restrictive diet. Anticipate that pt may require 24 hour supervision at discharge.      Intensity: Minumum of 1-2 x/day, 30 to 90 minutes Frequency: 3 to 5 out of 7 days Duration/Length of Stay: 3.5 weeks Treatment/Interventions: Cueing hierarchy;Functional tasks;Multimodal communication approach;Patient/family education;Cognitive remediation/compensation   Daily Session  Skilled Therapeutic Interventions: Skilled treatment session focused on dysphagia and speech communication goals. SLP facilitated the session with help of interpreter and  provided skilled supervision of pt consuming dysphagia 2 breakfast tray with trials of dysphagia 3 supplemented with tray. Pt with good oral manipulation of dysphagia 3 textures. Recommend observation of pt consuming full tray before upgrade. SLP further facilitated session by providing Max A faded to Mod A to expand utterance length to 2 to 3 words. Pt with good progress. Pt left upright in bed and interpreter staying in room for personal  conversation. Continue per current plan of care.     Function:   Eating Eating   Modified Consistency Diet: Yes Eating Assist Level: Supervision or verbal cues   Eating Set Up Assist For: Opening containers       Cognition Comprehension Comprehension assist level: Understands basic 75 - 89% of the time/ requires cueing 10 - 24% of the time  Expression   Expression assist level: Expresses basic 50 - 74% of the time/requires cueing 25 - 49% of the time. Needs to repeat parts of sentences.  Social Interaction Social Interaction assist level: Interacts appropriately 50 - 74% of the time - May be physically or verbally inappropriate.  Problem Solving Problem solving assist level: Solves basic 50 - 74% of the time/requires cueing 25 - 49% of the time  Memory Memory assist level: Recognizes or recalls 50 - 74% of the time/requires cueing 25 - 49% of the time   General    Pain    Therapy/Group: Individual Therapy  Ana Liaw 09/15/2017, 10:09 AM

## 2017-09-15 NOTE — Progress Notes (Signed)
[facility]     PROGRESS NOTE   Subjective/Complaints: Pt seen laying in bed this AM.  She slept well overnight. She attempts to tell me something.    ROS: Limited due to language, but appears to deny CP, SOB, nausea, vomiting, diarrhea.  Objective: Vital Signs: Blood pressure (!) 149/45, pulse 72, temperature 97.6 F (36.4 C), temperature source Oral, resp. rate 16, height 5' (1.524 m), weight 69.6 kg (153 lb 7 oz), SpO2 97 %. No results found. No results for input(s): WBC, HGB, HCT, PLT in the last 72 hours. No results for input(s): NA, K, CL, GLUCOSE, BUN, CREATININE, CALCIUM in the last 72 hours.  Invalid input(s): CO CBG (last 3)  Recent Labs    09/14/17 1633 09/14/17 2105 09/15/17 0631  GLUCAP 122* 133* 115*    Wt Readings from Last 3 Encounters:  09/06/17 69.6 kg (153 lb 7 oz)  09/02/17 70.8 kg (156 lb 1.4 oz)    Physical Exam:  Constitutional: She appears well-developed and well-nourished. No distress.  HENT: Normocephalic and atraumatic.  Eyes: EOMI. No discharge.  Cardiovascular: RRR. No JVD  Respiratory: CTA Bilaterally. Normal effort.  GI: Bowel sounds are normal. She exhibits no distension.  Musculoskeletal: She exhibits no edema or tenderness.  Neurological: She is alert.  Right facial weakness with right inattention.     Speech dysarthric     Has difficulty following simple motor commands (?language) Motor: Right upper extremity: Shoulder abduction 1+/5, distally 0/5 (unchanged).   Right lower extremity: ?4/5 proximal to distal  Left upper/left lower extremity:  Grossly 5/5.   Skin: Skin is warm and dry. She is not diaphoretic.  Psychiatric: Limited due to language  Assessment/Plan: 1.  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 Physiatrist is providing close team supervision  and 24 hour management of active medical problems listed below. Physiatrist and rehab team continue to assess barriers to discharge/monitor patient progress toward functional and medical goals.  Function:  Bathing Bathing position   Position: Shower  Bathing parts Body parts bathed by patient: Chest, Abdomen, Front perineal area, Right upper leg, Left upper leg, Right arm, Left lower leg, Right lower leg Body parts bathed by helper: Left arm, Back, Buttocks  Bathing assist        Upper Body Dressing/Undressing Upper body dressing   What is the patient wearing?: Pull over shirt/dress     Pull over shirt/dress - Perfomed by patient: Thread/unthread left sleeve, Put head through opening, Pull shirt over trunk Pull over shirt/dress - Perfomed by helper: Thread/unthread right sleeve        Upper body assist Assist Level: (Mod A)      Lower Body Dressing/Undressing Lower body dressing   What is the patient wearing?: Pants, Socks, Shoes     Pants- Performed by patient: Thread/unthread left pants leg Pants- Performed by helper: Thread/unthread right pants leg, Pull pants up/down   Non-skid slipper socks- Performed by helper: Don/doff right sock, Don/doff left sock Socks - Performed by patient: Don/doff left sock Socks - Performed by helper: Don/doff right sock, Don/doff left sock Shoes - Performed by patient: (incorrect entry on 09/08/17, should have been helper A) Shoes - Performed by helper: Don/doff right shoe, Don/doff left shoe, Fasten right, Fasten left       TED Hose - Performed by helper: Don/doff right TED hose, Don/doff left TED  hose  Lower body assist Assist for lower body dressing: (Total A)      Toileting Toileting Toileting activity did not occur: No continent bowel/bladder event Toileting steps completed by patient: Performs perineal hygiene(urinated only) Toileting steps completed by helper: Adjust clothing prior to toileting, Performs perineal hygiene, Adjust  clothing after toileting Toileting Assistive Devices: Grab bar or rail  Toileting assist Assist level: (used Stedy)   Transfers Chair/bed transfer   Chair/bed transfer method: Stand pivot Chair/bed transfer assist level: Moderate assist (Pt 50 - 74%/lift or lower) Chair/bed transfer assistive device: Armrests     Locomotion Ambulation     Max distance: 30 ft Assist level: Maximal assist (Pt 25 - 49%)   Wheelchair Wheelchair activity did not occur: (unable secondary to height) Type: Manual Max wheelchair distance: 100 Assist Level: Touching or steadying assistance (Pt > 75%)  Cognition Comprehension Comprehension assist level: Understands basic 75 - 89% of the time/ requires cueing 10 - 24% of the time  Expression Expression assist level: Expresses basic 50 - 74% of the time/requires cueing 25 - 49% of the time. Needs to repeat parts of sentences.  Social Interaction Social Interaction assist level: Interacts appropriately 50 - 74% of the time - May be physically or verbally inappropriate.  Problem Solving Problem solving assist level: Solves basic 50 - 74% of the time/requires cueing 25 - 49% of the time  Memory Memory assist level: Recognizes or recalls 50 - 74% of the time/requires cueing 25 - 49% of the time   Medical Problem List and Plan: 1.  Functional, cognitive and mobility deficits  secondary to hemorrhagic left basal ganglia infarct    Cont CIR   WHO, ROM 2.  DVT Prophylaxis/Anticoagulation: Mechanical: Sequential compression devices, below knee Bilateral lower extremities   Dopplers neg for DVT 3. Pain Management: N/A 4. Mood: LCSW to follow for evaluation and support as mentation improves.  5. Neuropsych: This patient is not capable of making decisions on her own behalf. 6. Skin/Wound Care: routine pressure relief measures.  7. Fluids/Electrolytes/Nutrition: Encourage oral intake.   BMP within acceptable limits on 12/19   Labs pending 8. HTN: Monitor BP qid.     Lisinopril increased to 20 mg BID on 12/19   HCTZ DC'd and chlorthalidone started on 12/23   Cont metoprolol.    Monitor renal status while on nectars.   Will consider further adjustments tomorrow 9. Leukocytosis: Resolved 10 Dysphagia: Continue dysphagia 2, nectar liquids.Monitor hydration status with routine checks.    Advance diet as tolerated 11. Dyslipidemia: On Lipitor  12. T2DM: Hgb A1C-7.1.  Monitor BS ac/hs. Resume metformin.  CBG (last 3)  Recent Labs    09/14/17 1633 09/14/17 2105 09/15/17 0631  GLUCAP 122* 133* 115*    Relatively controlled on 12/24  LOS (Days) 9 A FACE TO FACE EVALUATION WAS PERFORMED  Hansika Leaming Karis JubaAnil Adena Sima, MD 09/15/2017 10:56 AM

## 2017-09-15 NOTE — Progress Notes (Signed)
Physical Therapy Weekly Progress Note  Patient Details  Name: Cindy Thornton MRN: [id number] Date of Birth: 28-Nov-1950  Beginning of progress report period:09/07/17 End of progress report period: 09/15/17 Today's Date: 09/15/2017 PT Individual Time: 1300-1415 PT Individual Time Calculation (min): 75 min   Patient has met 2 of 3 short term goals.  Pt's body habitus (extremely short, with large abdomen) contributes to her difficulties, as she is unable to see her knees or feet when standing , for typical visual feedback.  Patient continues to demonstrate the following deficits decreased cardiorespiratoy endurance, impaired timing and sequencing, abnormal tone, unbalanced muscle activation, motor apraxia, decreased coordination and decreased motor planning, decreased visual perceptual skills, decreased visual motor skills and field cut, decreased midline orientation, decreased attention to right and decreased motor planning, decreased attention, decreased awareness, decreased problem solving, decreased safety awareness, decreased memory and delayed processing and decreased sitting balance, decreased standing balance, decreased postural control, hemiplegia and decreased balance strategies and therefore will continue to benefit from skilled PT intervention to increase functional independence with mobility.  Patient progressing toward long term goals..  Continue plan of care.  PT Short Term Goals Week 1:  PT Short Term Goal 1 (Week 1): Pt will ambulate x 50 ft with mod assist and LRAD PT Short Term Goal 2 (Week 1): Pt will perform w/c propulsion with min assist PT Short Term Goal 3 (Week 1): Pt will maintain dynamic standing balance without UE support with mod assist     Skilled Therapeutic Interventions/Progress Updates:  Translator [name] present.  W/c propulsion using hemi method with supervision and cues on straight path, min assist on turns due to R inattention.  W/c propulsion through cone  obstacle course, with accuracy 3/3 cones on R to avoid hitting them, when following PT's demo in front of her, 1/3  accuracy without PT demo.  Pt noted to turn head to R when cued, rarely spontaneously.  Sit> stand in parallel bars, ACe wrap on R knee for stability, and ACE on RUe to secure RUE in sttanding. Neuro re-ed via mirror feedback, multimodal cues for R knee control during bil mini squats x 10 with LUE support on bar. Gait training in // with mod/max assist for balance, R knee stance control.  Pt tends to lean backwards in standing, even with LUE support.   Seated R heel taps accurately to floor targets x 10.  Kicking activity with R foot in sitting, for knee ext velocity, with improved coordination with practice.  Kinetron in sitting in w/c, x 10 cycles x 25 cycles x 15 cycles.  Family returned pt back to room, with quick release belt already donned.    See function navigator for current status..    Therapy Documentation Precautions:  Precautions Precautions: Fall Precaution Comments: Rt hemi, Rt inattention, interpretor needs (Spanish)  Restrictions Weight Bearing Restrictions: No    See Function Navigator for Current Functional Status.  Therapy/Group: Individual Therapy  Kirsten Spearing 09/15/2017, 4:45 PM

## 2017-09-16 DIAGNOSIS — R0989 Other specified symptoms and signs involving the circulatory and respiratory systems: Secondary | ICD-10-CM | POA: Insufficient documentation

## 2017-09-16 LAB — GLUCOSE, CAPILLARY
GLUCOSE-CAPILLARY: 117 mg/dL — AB (ref 65–99)
GLUCOSE-CAPILLARY: 148 mg/dL — AB (ref 65–99)
GLUCOSE-CAPILLARY: 117 mg/dL — AB (ref 65–99)
Glucose-Capillary: 145 mg/dL — ABNORMAL HIGH (ref 65–99)

## 2017-09-16 NOTE — Progress Notes (Signed)
[facility] PHYSICAL MEDICINE & REHABILITATION     PROGRESS NOTE   Subjective/Complaints: Pt seen laying in bed this AM.  She slept well overnight.  Denies complaints.   ROS: Limited due to language, but appears to deny CP, SOB, nausea, vomiting, diarrhea.  Objective: Vital Signs: Blood pressure (!) 122/46, pulse 71, temperature 98.2 F (36.8 C), temperature source Oral, resp. rate 17, height 5' (1.524 m), weight 69.6 kg (153 lb 7 oz), SpO2 96 %. No results found. Recent Labs    09/15/17 1104  WBC 9.2  HGB 13.1  HCT 39.6  PLT 184   Recent Labs    09/15/17 1104  NA 139  K 4.1  CL 104  GLUCOSE 132*  BUN 23*  CREATININE 0.90  CALCIUM 9.8   CBG (last 3)  Recent Labs    09/15/17 1633 09/15/17 2105 09/16/17 0640  GLUCAP 137* 175* 117*    Wt Readings from Last 3 Encounters:  09/06/17 69.6 kg (153 lb 7 oz)  09/02/17 70.8 kg (156 lb 1.4 oz)    Physical Exam:  Constitutional: She appears well-developed and well-nourished. No distress.  HENT: Normocephalic and atraumatic.  Eyes: EOMI. No discharge.  Cardiovascular: RRR. No JVD  Respiratory: CTA Bilaterally. Normal effort.  GI: Bowel sounds are normal. She exhibits no distension.  Musculoskeletal: She exhibits no edema or tenderness.  Neurological: She is alert.  Right facial weakness with right inattention.     Speech dysarthric     Has difficulty following simple motor commands (?language) Motor: Right upper extremity: Shoulder abduction 1+/5, distally 0/5 (stable).   Right lower extremity: 4/5 proximal to distal  Skin: Skin is warm and dry. She is not diaphoretic.  Psychiatric: Limited due to language  Assessment/Plan: 1.  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 Physiatrist is providing close team supervision and 24 hour management of active medical problems listed  below. Physiatrist and rehab team continue to assess barriers to discharge/monitor patient progress toward functional and medical goals.  Function:  Bathing Bathing position   Position: Shower  Bathing parts Body parts bathed by patient: Chest, Abdomen, Front perineal area, Right upper leg, Left upper leg, Right arm, Left lower leg, Right lower leg Body parts bathed by helper: Left arm, Back, Buttocks  Bathing assist        Upper Body Dressing/Undressing Upper body dressing   What is the patient wearing?: Pull over shirt/dress, Bra   Bra - Perfomed by helper: Thread/unthread right bra strap, Thread/unthread left bra strap, Hook/unhook bra (pull down sports bra) Pull over shirt/dress - Perfomed by patient: Thread/unthread left sleeve, Pull shirt over trunk Pull over shirt/dress - Perfomed by helper: Thread/unthread right sleeve, Put head through opening        Upper body assist Assist Level: (Mod A)      Lower Body Dressing/Undressing Lower body dressing   What is the patient wearing?: Pants, Socks, Shoes     Pants- Performed by patient: Thread/unthread right pants leg, Thread/unthread left pants leg Pants- Performed by helper: Pull pants up/down   Non-skid slipper socks- Performed by helper: Don/doff right sock, Don/doff left sock Socks - Performed by patient: Don/doff left sock Socks - Performed by helper: Don/doff right sock, Don/doff left sock Shoes - Performed by patient: (incorrect entry on 09/08/17, should have been helper A) Shoes - Performed by helper: Don/doff right shoe, Don/doff left shoe,  Fasten right, Fasten left       TED Hose - Performed by helper: Don/doff right TED hose, Don/doff left TED hose  Lower body assist Assist for lower body dressing: (Total A)      Toileting Toileting Toileting activity did not occur: No continent bowel/bladder event Toileting steps completed by patient: Performs perineal hygiene(urinated only) Toileting steps completed by  helper: Performs perineal hygiene, Adjust clothing prior to toileting, Adjust clothing after toileting Toileting Assistive Devices: Grab bar or rail  Toileting assist Assist level: (used Stedy)   Transfers Chair/bed transfer   Chair/bed transfer method: Stand pivot Chair/bed transfer assist level: Moderate assist (Pt 50 - 74%/lift or lower) Chair/bed transfer assistive device: Armrests     Locomotion Ambulation     Max distance: 4 Assist level: Maximal assist (Pt 25 - 49%)   Wheelchair Wheelchair activity did not occur: (unable secondary to height) Type: Manual Max wheelchair distance: 150 Assist Level: Touching or steadying assistance (Pt > 75%)  Cognition Comprehension Comprehension assist level: Understands basic 50 - 74% of the time/ requires cueing 25 - 49% of the time  Expression Expression assist level: Expresses basic 25 - 49% of the time/requires cueing 50 - 75% of the time. Uses single words/gestures.  Social Interaction Social Interaction assist level: Interacts appropriately 50 - 74% of the time - May be physically or verbally inappropriate.  Problem Solving Problem solving assist level: Solves basic 50 - 74% of the time/requires cueing 25 - 49% of the time  Memory Memory assist level: Recognizes or recalls 50 - 74% of the time/requires cueing 25 - 49% of the time   Medical Problem List and Plan: 1.  Functional, cognitive and mobility deficits  secondary to hemorrhagic left basal ganglia infarct    Cont CIR   WHO, ROM 2.  DVT Prophylaxis/Anticoagulation: Mechanical: Sequential compression devices, below knee Bilateral lower extremities   Dopplers neg for DVT 3. Pain Management: N/A 4. Mood: LCSW to follow for evaluation and support as mentation improves.  5. Neuropsych: This patient is not capable of making decisions on her own behalf. 6. Skin/Wound Care: routine pressure relief measures.  7. Fluids/Electrolytes/Nutrition: Encourage oral intake.   BMP within  acceptable limits on 12/24 8. HTN: Monitor BP qid.    Lisinopril increased to 20 mg BID on 12/19   HCTZ DC'd and chlorthalidone started on 12/23   Cont metoprolol.    Monitor renal status while on nectars.   Labile, but overall improving on 12/25 9. Leukocytosis: Resolved 10 Dysphagia: Continue dysphagia 2, nectar liquids.Monitor hydration status with routine checks.    Advance diet as tolerated 11. Dyslipidemia: On Lipitor  12. T2DM: Hgb A1C-7.1.  Monitor BS ac/hs. Resume metformin.  CBG (last 3)  Recent Labs    09/15/17 1633 09/15/17 2105 09/16/17 0640  GLUCAP 137* 175* 117*    Relatively controlled on 12/25  LOS (Days) 10 A FACE TO FACE EVALUATION WAS PERFORMED  Ahilyn Nell Karis JubaAnil Natosha Bou, MD 09/16/2017 8:44 AM

## 2017-09-17 ENCOUNTER — Inpatient Hospital Stay (HOSPITAL_COMMUNITY): Payer: Self-pay | Admitting: Occupational Therapy

## 2017-09-17 ENCOUNTER — Inpatient Hospital Stay (HOSPITAL_COMMUNITY): Payer: Self-pay | Admitting: Speech Pathology

## 2017-09-17 ENCOUNTER — Inpatient Hospital Stay (HOSPITAL_COMMUNITY): Payer: Self-pay | Admitting: Physical Therapy

## 2017-09-17 LAB — GLUCOSE, CAPILLARY
GLUCOSE-CAPILLARY: 121 mg/dL — AB (ref 65–99)
Glucose-Capillary: 121 mg/dL — ABNORMAL HIGH (ref 65–99)
Glucose-Capillary: 132 mg/dL — ABNORMAL HIGH (ref 65–99)
Glucose-Capillary: 126 mg/dL — ABNORMAL HIGH (ref 65–99)

## 2017-09-17 LAB — BASIC METABOLIC PANEL
ANION GAP: 10 (ref 5–15)
BUN: 29 mg/dL — AB (ref 6–20)
CALCIUM: 9.6 mg/dL (ref 8.9–10.3)
CO2: 27 mmol/L (ref 22–32)
Chloride: 103 mmol/L (ref 101–111)
Creatinine, Ser: 0.93 mg/dL (ref 0.44–1.00)
GFR calc Af Amer: >60 mL/min (ref >60)
GLUCOSE: 115 mg/dL — AB (ref 65–99)
Potassium: 4.1 mmol/L (ref 3.5–5.1)
SODIUM: 140 mmol/L (ref 135–145)

## 2017-09-17 NOTE — Progress Notes (Signed)
Rested good. No unsafe behaviors. Cindy MartinezMurray, Tyiesha Brackney A

## 2017-09-17 NOTE — Progress Notes (Signed)
Orthopedic Tech Progress Note Patient Details:  [name] 1951/03/01 [id number]  Patient ID: [name], female   DOB: 1951/03/01, 66 y.o.   MRN: [id number]   Nikki DomCrawford, Jarelyn Bambach 09/17/2017, 12:16 PM Called in [employer] brace order; spoke with Viviann SpareSteven

## 2017-09-17 NOTE — Progress Notes (Signed)
Physical Therapy Note  Patient Details  Name: Cindy PewMaria Parra Thornton MRN: [id number] Date of Birth: 12-18-1950 Today's Date: 09/17/2017    Time: 1100-1155 55 minutes  1:1 No c/o pain.  Pt propels w/c in room and hallway with supervision, rest breaks every 75' due to fatigue.  stair negotiation 4 stairs x 2 with 1 handrail, mod A, manual facilitation for upright posture and Rt knee and hip control.  Gait with RW with Rt hand splint with mod A for trunk control and posture, improving Rt LE strength noted, continues to need assist for placing Rt LE and manual facilitation for wt shifts and to increase step length on Lt.  Sit to stand repetitions with focus on eccentric control and midline posture with min/mod A.  kinetron for LE strengthening 3 x 2 minutes.  Pt improving Rt LE strength, continues to be limited by poor hip and trunk stability on Rt.   Rayma Hegg 09/17/2017, 12:55 PM

## 2017-09-17 NOTE — Progress Notes (Signed)
[facility] PHYSICAL MEDICINE & REHABILITATION     PROGRESS NOTE   Subjective/Complaints: Pt seen laying in bed this AM.  She slept well overnight.  Interaction limited by language.   ROS: Limited due to language, but appears to deny CP, SOB, nausea, vomiting, diarrhea.  Objective: Vital Signs: Blood pressure (!) 134/50, pulse 82, temperature 98.4 F (36.9 C), temperature source Oral, resp. rate 18, height 5' (1.524 m), weight 69.6 kg (153 lb 7 oz), SpO2 98 %. No results found. Recent Labs    09/15/17 1104  WBC 9.2  HGB 13.1  HCT 39.6  PLT 184   Recent Labs    09/15/17 1104 09/17/17 0537  NA 139 140  K 4.1 4.1  CL 104 103  GLUCOSE 132* 115*  BUN 23* 29*  CREATININE 0.90 0.93  CALCIUM 9.8 9.6   CBG (last 3)  Recent Labs    09/16/17 1637 09/16/17 2143 09/17/17 0623  GLUCAP 148* 117* 121*    Wt Readings from Last 3 Encounters:  09/06/17 69.6 kg (153 lb 7 oz)  09/02/17 70.8 kg (156 lb 1.4 oz)    Physical Exam:  Constitutional: She appears well-developed and well-nourished. No distress.  HENT: Normocephalic and atraumatic.  Eyes: EOMI. No discharge.  Cardiovascular: RRR. No JVD  Respiratory: CTA Bilaterally. Normal effort.  GI: Bowel sounds are normal. She exhibits no distension.  Musculoskeletal: She exhibits no edema or tenderness.  Neurological: She is alert.  Right facial weakness  Speech dysarthric     Has difficulty following simple motor commands (?language) Motor: Right upper extremity: Shoulder abduction 1+/5, distally 0/5 (unchanged).   Right lower extremity: 4--4/5 proximal to distal  Skin: Skin is warm and dry. She is not diaphoretic.  Psychiatric: Limited due to language  Assessment/Plan: 1.  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 Physiatrist is providing close team supervision and 24 hour management  of active medical problems listed below. Physiatrist and rehab team continue to assess barriers to discharge/monitor patient progress toward functional and medical goals.  Function:  Bathing Bathing position   Position: Shower  Bathing parts Body parts bathed by patient: Chest, Abdomen, Front perineal area, Right upper leg, Left upper leg, Right arm, Left lower leg, Right lower leg Body parts bathed by helper: Left arm, Back, Buttocks  Bathing assist        Upper Body Dressing/Undressing Upper body dressing   What is the patient wearing?: Pull over shirt/dress, Bra   Bra - Perfomed by helper: Thread/unthread right bra strap, Thread/unthread left bra strap, Hook/unhook bra (pull down sports bra) Pull over shirt/dress - Perfomed by patient: Thread/unthread left sleeve, Pull shirt over trunk Pull over shirt/dress - Perfomed by helper: Thread/unthread right sleeve, Put head through opening        Upper body assist Assist Level: (Mod A)      Lower Body Dressing/Undressing Lower body dressing   What is the patient wearing?: Pants, Socks, Shoes     Pants- Performed by patient: Thread/unthread right pants leg, Thread/unthread left pants leg Pants- Performed by helper: Pull pants up/down   Non-skid slipper socks- Performed by helper: Don/doff right sock, Don/doff left sock Socks - Performed by patient: Don/doff left sock Socks - Performed by helper: Don/doff right sock, Don/doff left sock Shoes - Performed by patient: (incorrect entry on 09/08/17, should have been helper A) Shoes - Performed by helper: Don/doff  right shoe, Don/doff left shoe, Fasten right, Fasten left       TED Hose - Performed by helper: Don/doff right TED hose, Don/doff left TED hose  Lower body assist Assist for lower body dressing: (Total A)      Toileting Toileting Toileting activity did not occur: No continent bowel/bladder event Toileting steps completed by patient: Performs perineal hygiene(urinated  only) Toileting steps completed by helper: Adjust clothing prior to toileting, Performs perineal hygiene, Adjust clothing after toileting Toileting Assistive Devices: Grab bar or rail(stedy)  Toileting assist Assist level: Touching or steadying assistance (Pt.75%)   Transfers Chair/bed transfer   Chair/bed transfer method: Stand pivot Chair/bed transfer assist level: Moderate assist (Pt 50 - 74%/lift or lower) Chair/bed transfer assistive device: Armrests     Locomotion Ambulation     Max distance: 4 Assist level: Maximal assist (Pt 25 - 49%)   Wheelchair Wheelchair activity did not occur: (unable secondary to height) Type: Manual Max wheelchair distance: 150 Assist Level: Touching or steadying assistance (Pt > 75%)  Cognition Comprehension Comprehension assist level: Understands basic 50 - 74% of the time/ requires cueing 25 - 49% of the time  Expression Expression assist level: Expresses basic 25 - 49% of the time/requires cueing 50 - 75% of the time. Uses single words/gestures.  Social Interaction Social Interaction assist level: Interacts appropriately 50 - 74% of the time - May be physically or verbally inappropriate.  Problem Solving Problem solving assist level: Solves basic 50 - 74% of the time/requires cueing 25 - 49% of the time  Memory Memory assist level: Recognizes or recalls 50 - 74% of the time/requires cueing 25 - 49% of the time   Medical Problem List and Plan: 1.  Functional, cognitive and mobility deficits  secondary to hemorrhagic left basal ganglia infarct    Cont CIR   WHO, ROM 2.  DVT Prophylaxis/Anticoagulation: Mechanical: Sequential compression devices, below knee Bilateral lower extremities   Dopplers neg for DVT 3. Pain Management: N/A 4. Mood: LCSW to follow for evaluation and support as mentation improves.  5. Neuropsych: This patient is not capable of making decisions on her own behalf. 6. Skin/Wound Care: routine pressure relief measures.  7.  Fluids/Electrolytes/Nutrition: Encourage oral intake.   BMP within acceptable limits on 12/26 8. HTN: Monitor BP qid.    Lisinopril increased to 20 mg BID on 12/19   HCTZ DC'd and chlorthalidone started on 12/23   Cont metoprolol.    Monitor renal status while on nectars.   Relatively controlled on 12/26 9. Leukocytosis: Resolved 10 Dysphagia: Continue dysphagia 2, nectar liquids.Monitor hydration status with routine checks.    Advance diet as tolerated 11. Dyslipidemia: On Lipitor  12. T2DM: Hgb A1C-7.1.  Monitor BS ac/hs. Resume metformin.  CBG (last 3)  Recent Labs    09/16/17 1637 09/16/17 2143 09/17/17 0623  GLUCAP 148* 117* 121*    Relatively controlled on 12/26  LOS (Days) 11 A FACE TO FACE EVALUATION WAS PERFORMED  Nancyann Cotterman Karis JubaAnil Vedant Shehadeh, MD 09/17/2017 9:01 AM

## 2017-09-17 NOTE — Progress Notes (Addendum)
Occupational Therapy Session Note  Patient Details  Name: Cindy Thornton MRN: [id number] Date of Birth: 1950/11/23  Today's Date: 09/17/2017 OT Individual Time: 0900-1000 and 1340-1425  OT Individual Time Calculation (min): 60 min and 45 min    Short Term Goals: Week 2:  OT Short Term Goal 1 (Week 2): Pt will be able to stand at toilet with mod A to support balance to allow her to cleanse and manage clothing. OT Short Term Goal 2 (Week 2): Pt will be able to don shirt with min A. OT Short Term Goal 3 (Week 2): Pt will be able to transfer on and off toilet with mod A of 1.  Skilled Therapeutic Interventions/Progress Updates:    Session 1: Pt presents supine in bed, RN present completing administering meds, agreeable to OT tx session and with no c/o pain. Focus of session on ADL/self-care retraining. Interpretor not present during session but able to communicate with Pt via minimal Spanish and hand gestures. Pt completes bed mobility with ModA, transfers EOB>toilet>TTB>w/c using Stedy with MinA for sit<>[facility]. Pt requires MaxA for toileting tasks, able to complete peri-care after BM, though requires assist to ensure thoroughness. Pt completes bathing seated on TTB with overall ModA, min verbal cues for attending to R side. Pt requires Max HOH assist to wash LUE using RUE. Pt completes UB/LB dressing seated in w/c. ModA for UB dressing using hemi-technique, total assist for LB dressing due to time constraints with ModA for sit<>stand using LUE support on bedrail. Pt left seated in w/c end of session, QRB and lap tray donned, call bell and needs within reach.   Session 2:  Pt presents sitting up in w/c, agreeable to OT tx session. Pt completed sit<>stand x3 during session with ModA, utilizing theraband to simulate advancing waistband of pants over hips in preparation for task completion during dressing/toileting ADLs. Pt requires ModA to maintain standing balance, blocking R  knee. Pt is able to advance theraband over R hip and mostly over L hip with increased time/effort. Pt utilizes hemi technique to self propel w/c towards therapy gym, propelling approx 75% distance with therapist assisting for remaining distance for energy conservation. Seated in gym Pt participates LUE NMR using UE ranger with Pt noted to have trace muscle activation with internal rotation. Returned to room total assist via w/c for time conservation, Pt left seated in w/c, QRB donned call bell and needs within reach.   Therapy Documentation Precautions:  Precautions Precautions: Fall Precaution Comments: Rt hemi, Rt inattention, interpretor needs (Spanish)  Restrictions Weight Bearing Restrictions: No    ADL: ADL ADL Comments: Please see functional navigator for ADL status  See Function Navigator for Current Functional Status.   Therapy/Group: Individual Therapy  Orlando PennerBreanna L Elizjah Noblet 09/17/2017, 12:42 PM

## 2017-09-17 NOTE — Progress Notes (Signed)
Speech Language Pathology Daily Session Note  Patient Details  Name: Cindy Thornton MRN: [id number] Date of Birth: December 13, 1950  Today's Date: 09/17/2017 SLP Individual Time: 1305-1330 SLP Individual Time Calculation (min): 25 min  Short Term Goals: Week 2: SLP Short Term Goal 1 (Week 2): Pt will follow 2 step simple directions with Min A cues to achieve ~ 75% accuracy.  SLP Short Term Goal 2 (Week 2): Pt will utilize an increased vocal intensity at the word level to achieve increased intelligibility of ~ 90% with Min A cues.  SLP Short Term Goal 3 (Week 2): Pt will answer yes/no questions about current physical and aphasic condition to demonstrate intellectual awareness with ~ 75% accuracy and Min A cues.  SLP Short Term Goal 4 (Week 2): Pt will complete rote/automatic verbal sequences with Mod A cues.  SLP Short Term Goal 5 (Week 2): Pt will produce 2 to 3 word utterances during structured verbal tasks with Mod A cues.  SLP Short Term Goal 6 (Week 2): Pt will consume thin liquids and dysphagia 3 with minimal overt s/s of aspiration and Min A cues for use of compensatory strategies.   Skilled Therapeutic Interventions:  Pt was seen for skilled ST targeting goals for communication and dysphagia.  Pt reported that she recognized this therapist from last week but was unable to recall even 1 activity from previous therapy session despite being provided with a choice of two.  Pt was able to answer simple, personally relevant questions with 2 word responses in ~75% of opportunities with mod assist and more than a reasonable amount of time.  Pt consumed therapeutic trials of thin liquids (water) during her lunch meal to continue working towards liquids advancement.  Pt with very subtle, gentle throat clearing in <25% of opportunities and she utilized her swallowing precautions with mod I.  No appreciable difference was discerned between thin and nectar thick liquids.  Therefore, recommend proceeding with  a repeat MBS at next available appointment to determine safest diet consistencies.  Pt was left in wheelchair with nursing present.  Continue per current plan of care.    Function:  Eating Eating   Modified Consistency Diet: Yes Eating Assist Level: More than reasonable amount of time   Eating Set Up Assist For: Opening containers       Cognition Comprehension Comprehension assist level: Understands basic 50 - 74% of the time/ requires cueing 25 - 49% of the time  Expression Expression assistive device: Other (Comment)(interpreter ) Expression assist level: Expresses basic 25 - 49% of the time/requires cueing 50 - 75% of the time. Uses single words/gestures.  Social Interaction Social Interaction assist level: Interacts appropriately 50 - 74% of the time - May be physically or verbally inappropriate.  Problem Solving Problem solving assist level: Solves basic 50 - 74% of the time/requires cueing 25 - 49% of the time  Memory Memory assist level: Recognizes or recalls 25 - 49% of the time/requires cueing 50 - 75% of the time    Pain Pain Assessment Pain Assessment: No/denies pain  Therapy/Group: Individual Therapy  Khiya Friese, [name] 09/17/2017, 3:49 PM

## 2017-09-18 ENCOUNTER — Inpatient Hospital Stay (HOSPITAL_COMMUNITY): Payer: Self-pay

## 2017-09-18 ENCOUNTER — Inpatient Hospital Stay (HOSPITAL_COMMUNITY): Payer: Self-pay | Admitting: Speech Pathology

## 2017-09-18 ENCOUNTER — Encounter (HOSPITAL_COMMUNITY): Payer: Self-pay | Admitting: Speech Pathology

## 2017-09-18 ENCOUNTER — Inpatient Hospital Stay (HOSPITAL_COMMUNITY): Payer: Self-pay | Admitting: Physical Therapy

## 2017-09-18 ENCOUNTER — Inpatient Hospital Stay (HOSPITAL_COMMUNITY): Payer: Self-pay | Admitting: Occupational Therapy

## 2017-09-18 LAB — GLUCOSE, CAPILLARY
GLUCOSE-CAPILLARY: 114 mg/dL — AB (ref 65–99)
GLUCOSE-CAPILLARY: 132 mg/dL — AB (ref 65–99)
GLUCOSE-CAPILLARY: 108 mg/dL — AB (ref 65–99)
GLUCOSE-CAPILLARY: 131 mg/dL — AB (ref 65–99)

## 2017-09-18 NOTE — Progress Notes (Signed)
Occupational Therapy Session Note  Patient Details  Name: Cindy Thornton MRN: [id number] Date of Birth: 01/11/1951  Today's Date: 09/18/2017 OT Individual Time: 1330-1415 OT Individual Time Calculation (min): 45 min    Short Term Goals: Week 2:  OT Short Term Goal 1 (Week 2): Pt will be able to stand at toilet with mod A to support balance to allow her to cleanse and manage clothing. OT Short Term Goal 2 (Week 2): Pt will be able to don shirt with min A. OT Short Term Goal 3 (Week 2): Pt will be able to transfer on and off toilet with mod A of 1.  Skilled Therapeutic Interventions/Progress Updates:    treatment session focused on ADL/self care training, transfer training, pt education, NMR, activity tolerance, and balance training.  Upon entering, pt in room with interpreter and agreeable to AM ADL routine. Pt completed shower transfer w/c<>TTB with STEDY and mod A. HOH for facilitation of L UE during transfers and ADLs, trace mvt in digits, wrist, elbow, shoulder. Noted compensation with delts/traps for "lifting arm." Pt completed UB d/b with mod A using hemi-plegia techniques. LB d/b completed in sit<>stand level at sink in w/c with mod A for donning socks. Pt able to loop plants but required max A to pull up/down pants when standing. Pt repositioned in w/c with min v/c. Applied arm tray and QRB in w/c and left resting with needs met. No c/o pain.   Therapy Documentation Precautions:  Precautions Precautions: Fall Precaution Comments: Rt hemi, Rt inattention, interpretor needs (Spanish)  Restrictions Weight Bearing Restrictions: No Vital Signs: Therapy Vitals Temp: 98.4 F (36.9 C) Temp Source: Oral Pulse Rate: 77 Resp: 16 BP: (!) 137/50 Patient Position (if appropriate): Sitting Oxygen Therapy SpO2: 98 % O2 Device: Not Delivered Pain: Pain Assessment Pain Assessment: No/denies pain ADL: ADL ADL Comments: Please see functional navigator for ADL status  See Function  Navigator for Current Functional Status.   Therapy/Group: Individual Therapy  Delon Sacramento 09/18/2017, 3:39 PM

## 2017-09-18 NOTE — Progress Notes (Signed)
[facility]IC[facility]ILIT[facility]     PROGRESS NOTE   Subjective/Complaints: Pt seen laying in bed this AM.  She slept well overnight.    ROS: Limited due to language, but appears to deny CP, SOB, nausea, vomiting, diarrhea.  Objective: Vital Signs: Blood pressure (!) 146/48, pulse 79, temperature 98.4 F (36.9 C), temperature source Oral, resp. rate 18, height 5' (1.524 m), weight 69.6 kg (153 lb 7 oz), SpO2 97 %. No results found. No results for input(s): WBC, HGB, HCT, PLT in the last 72 hours. Recent Labs    09/17/17 0537  NA 140  K 4.1  CL 103  GLUCOSE 115*  BUN 29*  CREATININE 0.93  CALCIUM 9.6   CBG (last 3)  Recent Labs    09/17/17 2146 09/18/17 0643 09/18/17 1218  GLUCAP 121* 114* 132*    Wt Readings from Last 3 Encounters:  09/06/17 69.6 kg (153 lb 7 oz)  09/02/17 70.8 kg (156 lb 1.4 oz)    Physical Exam:  Constitutional: She appears well-developed and well-nourished. No distress.  HENT: Normocephalic and atraumatic.  Eyes: EOMI. No discharge.  Cardiovascular: RRR. No JVD  Respiratory: CTA Bilaterally. Normal effort.  GI: Bowel sounds are normal. She exhibits no distension.  Musculoskeletal: She exhibits no edema or tenderness.  Neurological: She is alert.  Right facial weakness  Speech dysarthric     Has difficulty following simple motor commands (?language) Motor: Right upper extremity: Shoulder abduction 1+/5, distally 0/5 (stable).   Right lower extremity: 4--4/5 proximal to distal  Skin: Skin is warm and dry. She is not diaphoretic.  Psychiatric: Limited due to language  Assessment/Plan: 1.  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 Physiatrist is providing close team supervision and 24 hour management of active medical problems listed below. Physiatrist and rehab team continue to assess  barriers to discharge/monitor patient progress toward functional and medical goals.  Function:  Bathing Bathing position   Position: Shower  Bathing parts Body parts bathed by patient: Chest, Abdomen, Front perineal area, Right upper leg, Left upper leg, Right arm Body parts bathed by helper: Left arm, Back, Buttocks, Right lower leg, Left lower leg  Bathing assist Assist Level: (ModA)      Upper Body Dressing/Undressing Upper body dressing   What is the patient wearing?: Pull over shirt/dress   Bra - Perfomed by helper: Thread/unthread right bra strap, Thread/unthread left bra strap, Hook/unhook bra (pull down sports bra) Pull over shirt/dress - Perfomed by patient: Put head through opening, Thread/unthread left sleeve Pull over shirt/dress - Perfomed by helper: Thread/unthread right sleeve, Put head through opening        Upper body assist Assist Level: (ModA )      Lower Body Dressing/Undressing Lower body dressing   What is the patient wearing?: AFO, Shoes, Socks, Pants, Non-skid slipper socks     Pants- Performed by patient: Thread/unthread right pants leg, Thread/unthread left pants leg Pants- Performed by helper: Thread/unthread right pants leg, Thread/unthread left pants leg, Pull pants up/down   Non-skid slipper socks- Performed by helper: Don/doff right sock, Don/doff left sock Socks - Performed by patient: Don/doff left sock Socks - Performed by helper: Don/doff right sock, Don/doff left sock Shoes - Performed by patient: (incorrect entry on 09/08/17, should have been helper A) Shoes - Performed by helper: Don/doff right shoe, Don/doff left shoe, Fasten right, Fasten left  TED Hose - Performed by helper: Don/doff right TED hose, Don/doff left TED hose  Lower body assist Assist for lower body dressing: (MaxA)      Toileting Toileting Toileting activity did not occur: No continent bowel/bladder event Toileting steps completed by patient: Performs perineal  hygiene Toileting steps completed by helper: Performs perineal hygiene, Adjust clothing prior to toileting, Adjust clothing after toileting Toileting Assistive Devices: Other (comment)(Stedy)  Toileting assist Assist level: Touching or steadying assistance (Pt.75%)   Transfers Chair/bed transfer   Chair/bed transfer method: Stand pivot Chair/bed transfer assist level: Moderate assist (Pt 50 - 74%/lift or lower) Chair/bed transfer assistive device: Armrests     Locomotion Ambulation     Max distance: 4 Assist level: Maximal assist (Pt 25 - 49%)   Wheelchair Wheelchair activity did not occur: (unable secondary to height) Type: Manual Max wheelchair distance: 150 Assist Level: Touching or steadying assistance (Pt > 75%)  Cognition Comprehension Comprehension assist level: Understands basic 50 - 74% of the time/ requires cueing 25 - 49% of the time  Expression Expression assist level: Expresses basic 25 - 49% of the time/requires cueing 50 - 75% of the time. Uses single words/gestures.  Social Interaction Social Interaction assist level: Interacts appropriately 50 - 74% of the time - May be physically or verbally inappropriate.  Problem Solving Problem solving assist level: Solves basic 50 - 74% of the time/requires cueing 25 - 49% of the time  Memory Memory assist level: Recognizes or recalls 25 - 49% of the time/requires cueing 50 - 75% of the time   Medical Problem List and Plan: 1.  Functional, cognitive and mobility deficits  secondary to hemorrhagic left basal ganglia infarct    Cont CIR   WHO, ROM 2.  DVT Prophylaxis/Anticoagulation: Mechanical: Sequential compression devices, below knee Bilateral lower extremities   Dopplers neg for DVT 3. Pain Management: N/A 4. Mood: LCSW to follow for evaluation and support as mentation improves.  5. Neuropsych: This patient is not capable of making decisions on her own behalf. 6. Skin/Wound Care: routine pressure relief measures.  7.  Fluids/Electrolytes/Nutrition: Encourage oral intake.   BMP within acceptable limits on 12/26 8. HTN: Monitor BP qid.    Lisinopril increased to 20 mg BID on 12/19   HCTZ DC'd and chlorthalidone started on 12/23   Cont metoprolol.    Monitor renal status while on nectars.   Relatively controlled on 12/27 9. Leukocytosis: Resolved 10 Dysphagia: Continue dysphagia 2, nectar liquids.Monitor hydration status with routine checks.    Advance diet as tolerated, plan for MBS 11. Dyslipidemia: On Lipitor  12. T2DM: Hgb A1C-7.1.  Monitor BS ac/hs. Resume metformin.  CBG (last 3)  Recent Labs    09/17/17 2146 09/18/17 0643 09/18/17 1218  GLUCAP 121* 114* 132*    Relatively controlled on 12/27  LOS (Days) 12 A FACE TO FACE EVALUATION WAS PERFORMED  Rosco Harriott Karis JubaAnil Natelie Ostrosky, MD 09/18/2017 12:25 PM

## 2017-09-18 NOTE — Progress Notes (Signed)
Modified Barium Swallow Progress Note  Patient Details  Name: Cindy Thornton MRN: [id number] Date of Birth: 01/31/1951  Today's Date: 09/18/2017  Modified Barium Swallow completed.  Full report located under Chart Review in the Imaging Section.  Brief recommendations include the following:  Clinical Impression   Pt presents with improved swallowing function in comparison to initial evaluation.  Pt has a prolonged oral phase with decreased bolus cohesion of thinner viscosities due to oral motor weakness which, in combination with suspected sensory deficits, results in delay in swallow initiation to the level of the pyriforms with thin liquids.  Despite delay, pt has relatively good airway protection with only intermittent instances of flash penetration, even when challenged with large, consecutive boluses.  As a result, recommend advancing pt to dys 3 textures and thin liquids with full supervision for use of swallowing precautions: upright for meals, no straws, slow rate, small bites/sips.  Prognosis for advancement is good with improved strength and endurance as well as with ongoing ST interventions for management of safe diet progression.     Swallow Evaluation Recommendations       SLP Diet Recommendations: Dysphagia 3 (Mech soft) solids;Thin liquid   Liquid Administration via: Cup;No straw   Medication Administration: Whole meds with puree   Supervision: Patient able to self feed   Compensations: Slow rate;Small sips/bites   Postural Changes: Seated upright at 90 degrees            Ingvald Theisen, Joni ReiningNicole L 09/18/2017,3:54 PM

## 2017-09-18 NOTE — Progress Notes (Signed)
Speech Language Pathology Daily Session Note  Patient Details  Name: Cindy Thornton MRN: [id number] Date of Birth: 1950-12-15  Today's Date: 09/18/2017 SLP Individual Time: 1305-1330 SLP Individual Time Calculation (min): 25 min  Short Term Goals: Week 2: SLP Short Term Goal 1 (Week 2): Pt will follow 2 step simple directions with Min A cues to achieve ~ 75% accuracy.  SLP Short Term Goal 2 (Week 2): Pt will utilize an increased vocal intensity at the word level to achieve increased intelligibility of ~ 90% with Min A cues.  SLP Short Term Goal 3 (Week 2): Pt will answer yes/no questions about current physical and aphasic condition to demonstrate intellectual awareness with ~ 75% accuracy and Min A cues.  SLP Short Term Goal 4 (Week 2): Pt will complete rote/automatic verbal sequences with Mod A cues.  SLP Short Term Goal 5 (Week 2): Pt will produce 2 to 3 word utterances during structured verbal tasks with Mod A cues.  SLP Short Term Goal 6 (Week 2): Pt will consume thin liquids and dysphagia 3 with minimal overt s/s of aspiration and Min A cues for use of compensatory strategies.   Skilled Therapeutic Interventions:  Pt was seen for skilled ST targeting communication and dysphagia goals.  Pt had already eaten lunch upon therapist's arrival.  Pt reports that meal went well and she demonstrated no overt s/s of aspiration with cup sips of thin liquids.  SLP assisted pt in completing oral care with minimal/trace residuals of solids removed from the oral cavity.  Pt remains unable to complete EMST at device's lowest resistance due to decreased respiratory drive; however, she was able to complete 30 repetitions of deep breathing exercises with supervision cues for use of incentive spirometer to provide visual feedback.  Pt's voice subjectively sounds stronger but continues to limit pt's intelligibility at the phrase level with pt needing mod assist for increased vocal intensity.  Pt has poor  awareness of communication limitations, stating that she doesn't feel there is anything wrong with her speech.  Pt was left in wheelchair with call bell within reach.  Continue per current plan of care.    Function:  Eating Eating   Modified Consistency Diet: Yes Eating Assist Level: More than reasonable amount of time   Eating Set Up Assist For: Opening containers       Cognition Comprehension Comprehension assist level: Understands basic 50 - 74% of the time/ requires cueing 25 - 49% of the time  Expression Expression assistive device: Other (Comment)(interpreter) Expression assist level: Expresses basic 50 - 74% of the time/requires cueing 25 - 49% of the time. Needs to repeat parts of sentences.  Social Interaction Social Interaction assist level: Interacts appropriately 75 - 89% of the time - Needs redirection for appropriate language or to initiate interaction.  Problem Solving Problem solving assist level: Solves basic 50 - 74% of the time/requires cueing 25 - 49% of the time  Memory Memory assist level: Recognizes or recalls 25 - 49% of the time/requires cueing 50 - 75% of the time    Pain Pain Assessment Pain Assessment: No/denies pain  Therapy/Group: Individual Therapy  Aja Whitehair, [name] 09/18/2017, 3:59 PM

## 2017-09-18 NOTE — Progress Notes (Signed)
Physical Therapy Note  Patient Details  Name: [name] MRN: [id number] Date of Birth: 07/14/1951 Today's Date: 09/18/2017    Time: 1100-1155 55 minutes  1:1 No c/o pain.  W/c mobility with hemi technique x 150' with supervision.  Attempt gait with RW with Rt hand splint, pt with decreased trunk control requiring more support on Rt side. Attempted eva walker but walker too tall for pt height. Pt able to perform gait 3 x 10' with Rt UE around PTs shoulders, mod manual facilitation for wt shifts and Rt LE placement.  Standing balance at high mat table to wt bearing through elbows and tactile cues for Rt hip and knee control and stability.  Pt able to perform wt shifts and side stepping with Lt LE with mod A.  kinetron per pt request 3 x 2 mins. Pt left in room with needs at hand.   Spyros Winch 09/18/2017, 12:53 PM

## 2017-09-19 ENCOUNTER — Inpatient Hospital Stay (HOSPITAL_COMMUNITY): Payer: Self-pay | Admitting: Occupational Therapy

## 2017-09-19 ENCOUNTER — Inpatient Hospital Stay (HOSPITAL_COMMUNITY): Payer: Self-pay

## 2017-09-19 ENCOUNTER — Ambulatory Visit (HOSPITAL_COMMUNITY): Payer: Self-pay | Admitting: Speech Pathology

## 2017-09-19 ENCOUNTER — Inpatient Hospital Stay (HOSPITAL_COMMUNITY): Payer: Self-pay | Admitting: Speech Pathology

## 2017-09-19 LAB — GLUCOSE, CAPILLARY
GLUCOSE-CAPILLARY: 132 mg/dL — AB (ref 65–99)
Glucose-Capillary: 119 mg/dL — ABNORMAL HIGH (ref 65–99)
Glucose-Capillary: 109 mg/dL — ABNORMAL HIGH (ref 65–99)
Glucose-Capillary: 104 mg/dL — ABNORMAL HIGH (ref 65–99)

## 2017-09-19 NOTE — Progress Notes (Signed)
Speech Language Pathology Daily Session Note  Patient Details  Name: Cindy Thornton MRN: [id number] Date of Birth: Feb 18, 1951  Today's Date: 09/19/2017 SLP Individual Time: 0900-0929 SLP Individual Time Calculation (min): 29 min  Short Term Goals: Week 2: SLP Short Term Goal 1 (Week 2): Pt will follow 2 step simple directions with Min A cues to achieve ~ 75% accuracy.  SLP Short Term Goal 2 (Week 2): Pt will utilize an increased vocal intensity at the word level to achieve increased intelligibility of ~ 90% with Min A cues.  SLP Short Term Goal 3 (Week 2): Pt will answer yes/no questions about current physical and aphasic condition to demonstrate intellectual awareness with ~ 75% accuracy and Min A cues.  SLP Short Term Goal 4 (Week 2): Pt will complete rote/automatic verbal sequences with Mod A cues.  SLP Short Term Goal 5 (Week 2): Pt will produce 2 to 3 word utterances during structured verbal tasks with Mod A cues.  SLP Short Term Goal 6 (Week 2): Pt will consume thin liquids and dysphagia 3 with minimal overt s/s of aspiration and Min A cues for use of compensatory strategies.   Skilled Therapeutic Interventions:  Session 1:  Pt was seen for skilled ST targeting goals for speech and swallowing.  Pt consumed thin liquids with mod I use of swallowing precautions for slow rate and small sips.  No overt s/s of aspiration were evident with liquids.  Pt had already consumed breakfast prior to therapist's arrival and had now marked residue of solids.  Pt completed x40 deep breathing exercises with incentive spirometer providing visual feedback with supervision cues.  She was also able to complete 5 repetitions of EMST with max assist to achieve complete labial seal.  Pt needed mod assist phonemic and semantic cues to tell therapist the date and she needed max assist verbal cues to recall her discharge date.  Therapist provided pt with a simplified calendar (due to pt being illiterate) to provide pt  opportunities to visually see progress towards discharge.  Pt was left in wheelchair with call bell within reach and quck release belt donned.    Function:  Eating Eating   Modified Consistency Diet: Yes Eating Assist Level: More than reasonable amount of time           Cognition Comprehension Comprehension assist level: Understands basic 75 - 89% of the time/ requires cueing 10 - 24% of the time  Expression   Expression assist level: Expresses basic 50 - 74% of the time/requires cueing 25 - 49% of the time. Needs to repeat parts of sentences.  Social Interaction Social Interaction assist level: Interacts appropriately 90% of the time - Needs monitoring or encouragement for participation or interaction.  Problem Solving Problem solving assist level: Solves basic 75 - 89% of the time/requires cueing 10 - 24% of the time  Memory Memory assist level: Recognizes or recalls 25 - 49% of the time/requires cueing 50 - 75% of the time    Pain Pain Assessment Pain Assessment: No/denies pain  Therapy/Group: Individual Therapy  Ezzie Senat, Joni ReiningNicole L 09/19/2017, 9:30 AM

## 2017-09-19 NOTE — Patient Care Conference (Signed)
Inpatient RehabilitationTeam Conference and Plan of Care Update Date: 09/17/2017   Time: 2:40 PM    Patient Name: Cindy Thornton      Medical Record Number: [id number]  Date of Birth: 1950-12-02 Sex: Female         Room/Bed: 4W19C/4W19C-01 Payor Info: Payor: /    Admitting Diagnosis: CVA  Admit Date/Time:  09/06/2017  3:56 PM Admission Comments: No comment available   Primary Diagnosis:  <principal problem not specified> Principal Problem: <principal problem not specified>  Patient Active Problem List   Diagnosis Date Noted  . Labile blood pressure   . Essential hypertension   . Diabetes mellitus type 2 in nonobese (HCC)   . Right hemiparesis (HCC)   . 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 ICH (intracerebral hemorrhage) (HCC) 09/06/2017  . Leukocytosis   . Dysphagia, post-stroke   . Benign essential HTN   . Diabetes mellitus type 2 in obese (HCC)   . 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 Maryla MorrowAnkit Patel Social Worker Present: Dossie DerBecky DuPree, LCSW Nurse Present: Tennis MustLuz Rosero, RN PT Present: Judieth KeensKaren Donaworth, PT OT Present: Roney MansJennifer Smith, OT SLP Present: Jackalyn LombardNicole Page, SLP PPS Coordinator present : Tora DuckMarie Noel, RN, CRRN     Current Status/Progress Goal Weekly Team Focus  Medical   Functional, cognitive and mobility deficits  secondary to hemorrhagic left basal ganglia infarct  Improve mobility, BP, DM, dysphagia  See above   Bowel/Bladder             Swallow/Nutrition/ Hydration   initiated on the water protocol, improving mastication of trials of dys 3 textures   mod I   repeat MBS 12/27 to determine readiness to advance   ADL's   Making some slow gains from last week, max with LB self care, mod UB bathing, mad - max stand pivot and standing, min a with stedy  MinA overall  ADL  retraining, RUE NMR, functional transfers, ptfamily education   Mobility             Communication   mod-max assist, initiated exercises to target deep breathing to improve breath support for speech, continues to have word finding deficits   min assist   continue to address naming, intelligibility, coordination of respiration with phonation   Safety/Cognition/ Behavioral Observations  mod assist for basic problem solving   min assist   awareness of verbal errors, problem solving   Pain             Skin              Rehab Goals Patient on target to meet rehab goals: Yes *See Care Plan and progress notes for long and short-term goals.     Barriers to Discharge  Current Status/Progress Possible Resolutions Date Resolved   Physician    Medical stability     See above  Therapies, optimize HTN/DM meds, advance diet as tolerated      Nursing                  PT                    OT                  SLP  SW                Discharge Planning/Teaching Needs:  Pt to d/c to daughter's home [name] and she will provide 24/7 assistance.  Teaching to be planned closer to d/c.   Team Discussion:  Min assist overall.  Right leg improving.  Dys 2 nectar with trials of thin.  Intelligibility and recognition issues.    Revisions to Treatment Plan:  None    Continued Need for Acute Rehabilitation Level of Care: The patient requires daily medical management by a physician with specialized training in physical medicine and rehabilitation for the following conditions: Daily direction of a multidisciplinary physical rehabilitation program to ensure safe treatment while eliciting the highest outcome that is of practical value to the patient.: Yes Daily medical management of patient stability for increased activity during participation in an intensive rehabilitation regime.: Yes Daily analysis of laboratory values and/or radiology reports with any subsequent need for  medication adjustment of medical intervention for : Neurological problems;Diabetes problems;Blood pressure problems  Cindy Thornton 09/19/2017, 3:43 PM

## 2017-09-19 NOTE — Progress Notes (Signed)
Physical Therapy Session Note  Patient Details  Name: Cindy Thornton MRN: [id number] Date of Birth: 1951-06-13  Today's Date: 09/19/2017 PT Individual Time: 1330-1415 PT Individual Time Calculation (min): 45 min    Skilled Therapeutic Interventions/Progress Updates:    Session focused on NMR to address postural control re-training in standing, equal weightbearing in standing, R knee control and muscle activation and BLE w/c propulsion for coordination and reciprocal movement pattern re-training. Mod assist overall for sit <> stands with facilitation for reorientation to midline and assist for postural control. Initially attempting with RW and R hand orthosis but pt unable to maintain enough postural control to utilize RW and PT to facilitate RLE or assist with trunk safely, so utilized L rail in hallway with max assist to facilitate RLE and mod assist for postural control during gait training x 15'. Blocked practice terminal knee extension in standing with PT facilitating at R quad and mod assist sit <> stands. Requires cues for attention to RUE/RLE placement throughout session.   Therapy Documentation Precautions:  Precautions Precautions: Fall Precaution Comments: Rt hemi, Rt inattention, interpretor needs (Spanish)  Restrictions Weight Bearing Restrictions: No  Pain:  Denies pain.    See Function Navigator for Current Functional Status.   Therapy/Group: Individual Therapy  Karolee StampsGray, Jacarri Gesner Darrol PokeBrescia  Yzabella Crunk B. Kevontay Burks, PT, DPT  09/19/2017, 2:51 PM

## 2017-09-19 NOTE — Progress Notes (Addendum)
[name] [facility]ICINE & REHABILITATION     PROGRESS NOTE   Subjective/Complaints: Pt seen laying in bed this AM.  She slept well overnight.  She denies complaints.  She does not have any questions for me this AM.  ROS: Limited due to language, but appears to deny CP, SOB, nausea, vomiting, diarrhea.  Objective: Vital Signs: Blood pressure (!) 136/52, pulse 80, temperature 98.1 F (36.7 C), temperature source Oral, resp. rate 17, height 5' (1.524 m), weight 69.6 kg (153 lb 7 oz), SpO2 99 %. Dg Swallowing Func-speech Pathology  Result Date: 09/18/2017 Objective Swallowing Evaluation: Type of Study: MBS-Modified Barium Swallow Study  Patient Details Name: Cindy Thornton MRN: [id number] Date of Birth: 1951-08-31 Today's Date: 09/18/2017 Time: SLP Start Time (ACUTE ONLY): 1200 -SLP Stop Time (ACUTE ONLY): 1225 SLP Time Calculation (min) (ACUTE ONLY): 25 min Past Medical History: Past Medical History: Diagnosis Date . Diabetes mellitus without complication (HCC)  . Hyperlipidemia  . Hypertension  Past Surgical History: No past surgical history on file. HPI: 66 year old female with basal ganglia hemorrhage, likely hypertensive in etiology. CT shows Hyperdense 2.7 cm area of hemorrhage in the posterior left lentiform nuclei with surrounding edema.  Dys 2, nectar thick liquids per MBS on 12/12.  Repeat MBS ordered to determine readiness to advance given improvements noted at bedside.   No Data Recorded Assessment / Plan / Recommendation CHL IP CLINICAL IMPRESSIONS 09/18/2017 Clinical Impression Pt presents with improved swallowing function in comparison to initial evaluation.  Pt has a prolonged oral phase with decreased bolus cohesion of thinner viscosities due to oral motor weakness which, in combination with suspected sensory deficits, results in delay in swallow initiation to the level of the pyriforms with thin liquids.  Despite delay, pt has relatively good airway protection with only intermittent  instances of flash penetration, even when challenged with large, consecutive boluses.  As a result, recommend advancing pt to dys 3 textures and thin liquids with full supervision for use of swallowing precautions: upright for meals, no straws, slow rate, small bites/sips.  Prognosis for advancement is good with improved strength and endurance as well as with ongoing ST interventions for management of safe diet progression.   SLP Visit Diagnosis Dysphagia, oropharyngeal phase (R13.12)     Impact on safety and function Mild aspiration risk     CHL IP DIET RECOMMENDATION 09/18/2017 SLP Diet Recommendations Dysphagia 3 (Mech soft) solids;Thin liquid Liquid Administration via Cup;No straw Medication Administration Whole meds with puree Compensations Slow rate;Small sips/bites Postural Changes Seated upright at 90 degrees            CHL IP ORAL PHASE 09/18/2017 Oral Phase Impaired Oral - Pudding Teaspoon -- Oral - Pudding Cup -- Oral - Honey Teaspoon -- Oral - Honey Cup -- Oral - Nectar Teaspoon -- Oral - Nectar Cup Delayed oral transit;Reduced posterior propulsion Oral - Nectar Straw -- Oral - Thin Teaspoon -- Oral - Thin Cup Premature spillage;Decreased bolus cohesion;Delayed oral transit;Reduced posterior propulsion Oral - Thin Straw Decreased bolus cohesion Oral - Puree -- Oral - Mech Soft -- Oral - Regular Impaired mastication;Delayed oral transit Oral - Multi-Consistency -- Oral - Pill -- Oral Phase - Comment --  CHL IP PHARYNGEAL PHASE 09/18/2017 Pharyngeal Phase Impaired Pharyngeal- Pudding Teaspoon -- Pharyngeal -- Pharyngeal- Pudding Cup -- Pharyngeal -- Pharyngeal- Honey Teaspoon -- Pharyngeal -- Pharyngeal- Honey Cup -- Pharyngeal -- Pharyngeal- Nectar Teaspoon -- Pharyngeal -- Pharyngeal- Nectar Cup WFL Pharyngeal -- Pharyngeal- Nectar Straw -- Pharyngeal --  Pharyngeal- Thin Teaspoon -- Pharyngeal -- Pharyngeal- Thin Cup Delayed swallow initiation-pyriform sinuses Pharyngeal -- Pharyngeal- Thin Straw Delayed  swallow initiation-pyriform sinuses;Penetration/Aspiration during swallow Pharyngeal Material enters airway, remains ABOVE vocal cords then ejected out Pharyngeal- Puree -- Pharyngeal -- Pharyngeal- Mechanical Soft -- Pharyngeal -- Pharyngeal- Regular WFL Pharyngeal -- Pharyngeal- Multi-consistency -- Pharyngeal -- Pharyngeal- Pill -- Pharyngeal -- Pharyngeal Comment --  No flowsheet data found. No flowsheet data found. Page, [name] 09/18/2017, 3:49 PM              No results for input(s): WBC, HGB, HCT, PLT in the last 72 hours. Recent Labs    09/17/17 0537  NA 140  K 4.1  CL 103  GLUCOSE 115*  BUN 29*  CREATININE 0.93  CALCIUM 9.6   CBG (last 3)  Recent Labs    09/18/17 1645 09/18/17 2152 09/19/17 0638  GLUCAP 108* 131* 119*    Wt Readings from Last 3 Encounters:  09/06/17 69.6 kg (153 lb 7 oz)  09/02/17 70.8 kg (156 lb 1.4 oz)    Physical Exam:  Constitutional: She appears well-developed and well-nourished. No distress.  HENT: Normocephalic and atraumatic.  Eyes: EOMI. No discharge.  Cardiovascular: RRR. No JVD  Respiratory: CTA Bilaterally. Normal effort.  GI: Bowel sounds are normal. She exhibits no distension.  Musculoskeletal: She exhibits no edema or tenderness.  Neurological: She is alert.  Right facial weakness  Speech dysarthric     Has difficulty following simple motor commands (?language) Motor: Right upper extremity: Shoulder abduction 1+/5, distally 0/5 (unchanged).   Right lower extremity: 4--4/5 proximal to distal  Skin: Skin is warm and dry. She is not diaphoretic.  Psychiatric: Limited due to language  Assessment/Plan: 1.  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 Physiatrist is providing close team supervision and 24 hour management of active medical problems listed below. Physiatrist and rehab team  continue to assess barriers to discharge/monitor patient progress toward functional and medical goals.  Function:  Bathing Bathing position   Position: Shower  Bathing parts Body parts bathed by patient: Chest, Abdomen, Front perineal area, Right upper leg, Left upper leg, Right arm Body parts bathed by helper: Left arm, Back, Buttocks, Right lower leg, Left lower leg  Bathing assist Assist Level: Touching or steadying assistance(Pt > 75%)      Upper Body Dressing/Undressing Upper body dressing   What is the patient wearing?: Pull over shirt/dress   Bra - Perfomed by helper: Thread/unthread right bra strap, Thread/unthread left bra strap, Hook/unhook bra (pull down sports bra) Pull over shirt/dress - Perfomed by patient: Thread/unthread left sleeve, Put head through opening, Pull shirt over trunk Pull over shirt/dress - Perfomed by helper: Thread/unthread right sleeve        Upper body assist Assist Level: Touching or steadying assistance(Pt > 75%)      Lower Body Dressing/Undressing Lower body dressing   What is the patient wearing?: Pants, Underwear, Non-skid slipper socks     Pants- Performed by patient: Thread/unthread right pants leg, Thread/unthread left pants leg Pants- Performed by helper: Pull pants up/down   Non-skid slipper socks- Performed by helper: Don/doff right sock, Don/doff left sock Socks - Performed by patient: Don/doff left sock Socks - Performed by helper: Don/doff right sock, Don/doff left sock Shoes - Performed by patient: (incorrect entry on 09/08/17, should have been helper A) Shoes - Performed by helper:  Don/doff right shoe, Don/doff left shoe, Fasten right, Fasten left       TED Hose - Performed by helper: Don/doff right TED hose, Don/doff left TED hose  Lower body assist Assist for lower body dressing: Touching or steadying assistance (Pt > 75%)      Toileting Toileting Toileting activity did not occur: No continent bowel/bladder  event Toileting steps completed by patient: Performs perineal hygiene Toileting steps completed by helper: Adjust clothing prior to toileting, Performs perineal hygiene, Adjust clothing after toileting Toileting Assistive Devices: (stedy)  Toileting assist Assist level: Two helpers   Transfers Chair/bed transfer   Chair/bed transfer method: Stand pivot Chair/bed transfer assist level: Moderate assist (Pt 50 - 74%/lift or lower) Chair/bed transfer assistive device: Armrests     Locomotion Ambulation     Max distance: 4 Assist level: Maximal assist (Pt 25 - 49%)   Wheelchair Wheelchair activity did not occur: (unable secondary to height) Type: Manual Max wheelchair distance: 150 Assist Level: Touching or steadying assistance (Pt > 75%)  Cognition Comprehension Comprehension assist level: Understands basic 75 - 89% of the time/ requires cueing 10 - 24% of the time  Expression Expression assist level: Expresses basic 50 - 74% of the time/requires cueing 25 - 49% of the time. Needs to repeat parts of sentences.  Social Interaction Social Interaction assist level: Interacts appropriately 90% of the time - Needs monitoring or encouragement for participation or interaction.  Problem Solving Problem solving assist level: Solves basic 75 - 89% of the time/requires cueing 10 - 24% of the time  Memory Memory assist level: Recognizes or recalls 25 - 49% of the time/requires cueing 50 - 75% of the time   Medical Problem List and Plan: 1.  Functional, cognitive and mobility deficits  secondary to hemorrhagic left basal ganglia infarct    Cont CIR   WHO, ROM 2.  DVT Prophylaxis/Anticoagulation: Mechanical: Sequential compression devices, below knee Bilateral lower extremities   Dopplers neg for DVT 3. Pain Management: N/A 4. Mood: LCSW to follow for evaluation and support as mentation improves.  5. Neuropsych: This patient is not capable of making decisions on her own behalf. 6. Skin/Wound  Care: routine pressure relief measures.  7. Fluids/Electrolytes/Nutrition: Encourage oral intake.   BMP within acceptable limits on 12/26   Labs ordered for Monday 8. HTN: Monitor BP qid.    Lisinopril increased to 20 mg BID on 12/19   HCTZ DC'd and chlorthalidone started on 12/23   Cont metoprolol.    Monitor renal status while on nectars.   Improving control on 12/28 9. Leukocytosis: Resolved 10 Dysphagia:    Advanced to D3 thin, cont to advance as tolerated 11. Dyslipidemia: On Lipitor  12. T2DM: Hgb A1C-7.1.  Monitor BS ac/hs. Resume metformin.  CBG (last 3)  Recent Labs    09/18/17 1645 09/18/17 2152 09/19/17 0638  GLUCAP 108* 131* 119*    Relatively controlled on 12/28  LOS (Days) 13 A FACE TO FACE EVALUATION WAS PERFORMED  Ankit Karis Juba, MD 09/19/2017 9:48 AM

## 2017-09-19 NOTE — Progress Notes (Signed)
Occupational Therapy Session Note  Patient Details  Name: Cindy Thornton MRN: [id number] Date of Birth: [birth year]  Today's Date: 09/19/2017 OT Individual Time: [id number] OT Individual Time Calculation (min): 56 min   Short Term Goals: Week 2:  OT Short Term Goal 1 (Week 2): Pt will be able to stand at toilet with mod A to support balance to allow her to cleanse and manage clothing. OT Short Term Goal 2 (Week 2): Pt will be able to don shirt with min A. OT Short Term Goal 3 (Week 2): Pt will be able to transfer on and off toilet with mod A of 1.  Skilled Therapeutic Interventions/Progress Updates:    Tx focus on Rt NMR, balance, and functional transfers during self care tasks.   Pt greeted in w/c, requesting shower. Pt transferring via Stedy to TTB with Min A sit<stand. Noted Rt knee buckling while standing in device throughout session. Pt standing in [place] and holding affected arm on bar with Lt hand for joint protection. Pt bathing while seated, issued her bath mitt with pt using Rt via active assist to wash UB. Even used it to shampoo hair (with OT keeping shoulder flexed <90 degrees). OT positioned her in figure 4 with Rt LE to wash foot. Tried this with L LE with pt collapsing onto Rt side and requiring Mod A from OT to correct balance. Sit<stand in [place] with Min A for OT to complete pericare and thoroughly rinse. She was taken back to room and transferred to w/c. Pt able to complete 3/4 components of donning overhead shirt with instruction on hemi techniques. Able to thread both LEs into pants! She assisted OT with elevating pants over hips on Lt side, however due to Rt knee buckling increasing, OT lifted pants up completely. All sit<stands at sink completed with Mod A for supporting Rt side. Per interpretor, Rt AFO has not yet arrived to room. For grooming/oral care completion, facilitated Rt UE as gross stabilizer/assist for promoting neuroplastic changes. At end of tx pt was repositioned  to protect hemiplegic side and left with safety belt applied and RN present.   Interpretor present during tx.   Therapy Documentation Precautions:  Precautions Precautions: Fall Precaution Comments: Rt hemi, Rt inattention, interpretor needs (Spanish)  Restrictions Weight Bearing Restrictions: No Pain: Pain Assessment Pain Assessment: No/denies pain ADL: ADL ADL Comments: Please see functional navigator for ADL status     See Function Navigator for Current Functional Status.   Therapy/Group: Individual Therapy  [name] A [name] 09/19/2017, 12:30 PM

## 2017-09-19 NOTE — Progress Notes (Signed)
Occupational Therapy Session Note  Patient Details  Name: Cindy PewMaria Parra Soto MRN: [id number] Date of Birth: 25-Nov-1950  Today's Date: 09/19/2017 OT Individual Time: 0930-1000 OT Individual Time Calculation (min): 30 min    Skilled Therapeutic Interventions/Progress Updates:    Pt seen for OT session focuisng on neuro muscual re-education and functional standing balance through weight shifting and weightbearing activity. Pt sitting up in w/c upon arrival with interpretor present, pt agreeable to tx session. Taken to therapy gym total A in w/c for energy conservation.  Completed standing weight shifting task at therapy mat, R UE placed into weightbearing position on mat and R knee blocked by therapist. While standing, pt required to sort bean bags based on color, weight shifting to R to obtain item. Required min-mod cuing for attention to R. Completed x2 trials, pt tolerated ~3-4 minutes before requiring seated rest break. Stood with min-mod A from w/c. Pt returned to room at end of session, left seated in w/c with QRB donned and all needs in reach.   Therapy Documentation Precautions:  Precautions Precautions: Fall Precaution Comments: Rt hemi, Rt inattention, interpretor needs (Spanish)  Restrictions Weight Bearing Restrictions: No Pain:   No/ denies pain ADL: ADL ADL Comments: Please see functional navigator for ADL status  See Function Navigator for Current Functional Status.   Therapy/Group: Individual Therapy  Orlie Cundari L 09/19/2017, 6:46 AM

## 2017-09-19 NOTE — Progress Notes (Signed)
Speech Language Pathology Daily Session Note  Patient Details  Name: Cindy Thornton MRN: [id number] Date of Birth: 08/01/1951  Today's Date: 09/19/2017 SLP Individual Time: 1300-1330 SLP Individual Time Calculation (min): 30 min  Short Term Goals: Week 2: SLP Short Term Goal 1 (Week 2): Pt will follow 2 step simple directions with Min A cues to achieve ~ 75% accuracy.  SLP Short Term Goal 2 (Week 2): Pt will utilize an increased vocal intensity at the word level to achieve increased intelligibility of ~ 90% with Min A cues.  SLP Short Term Goal 3 (Week 2): Pt will answer yes/no questions about current physical and aphasic condition to demonstrate intellectual awareness with ~ 75% accuracy and Min A cues.  SLP Short Term Goal 4 (Week 2): Pt will complete rote/automatic verbal sequences with Mod A cues.  SLP Short Term Goal 5 (Week 2): Pt will produce 2 to 3 word utterances during structured verbal tasks with Mod A cues.  SLP Short Term Goal 6 (Week 2): Pt will consume thin liquids and dysphagia 3 with minimal overt s/s of aspiration and Min A cues for use of compensatory strategies.   Skilled Therapeutic Interventions:  Pt was seen for skilled ST targeting cognitive-linguistic goals.  SLP facilitated the session with a familiar grocery planning task to address verbal expression.  When given 3 categories, pt was able to locate items from a grocery store flyer that fit into targeted categories and named them with min-mod assist verbal cues.  Pt much more communicative today in comparison to previous therapy sessions but continues to need min-mod assist to increase vocal intensity to achieve intelligibility.  Pt was handed off to PT in wheelchair.  Continue per current plan of care.    Function:  Eating Eating                 Cognition Comprehension Comprehension assist level: Understands basic 75 - 89% of the time/ requires cueing 10 - 24% of the time  Expression   Expression assist  level: Expresses basic 50 - 74% of the time/requires cueing 25 - 49% of the time. Needs to repeat parts of sentences.  Social Interaction Social Interaction assist level: Interacts appropriately 75 - 89% of the time - Needs redirection for appropriate language or to initiate interaction.  Problem Solving Problem solving assist level: Solves basic 50 - 74% of the time/requires cueing 25 - 49% of the time  Memory Memory assist level: Recognizes or recalls 25 - 49% of the time/requires cueing 50 - 75% of the time    Pain Pain Assessment Pain Assessment: No/denies pain  Therapy/Group: Individual Therapy  Shawntavia Saunders, [name] 09/19/2017, 5:19 PM

## 2017-09-20 ENCOUNTER — Inpatient Hospital Stay (HOSPITAL_COMMUNITY): Payer: Self-pay | Admitting: Occupational Therapy

## 2017-09-20 LAB — GLUCOSE, CAPILLARY
GLUCOSE-CAPILLARY: 111 mg/dL — AB (ref 65–99)
GLUCOSE-CAPILLARY: 150 mg/dL — AB (ref 65–99)
Glucose-Capillary: 161 mg/dL — ABNORMAL HIGH (ref 65–99)
Glucose-Capillary: 118 mg/dL — ABNORMAL HIGH (ref 65–99)

## 2017-09-20 NOTE — Progress Notes (Signed)
Occupational Therapy Session Note  Patient Details  Name: Cindy Thornton MRN: [id number] Date of Birth: 09-29-1950  Today's Date: 09/20/2017 OT Individual Time: 1000-1110 OT Individual Time Calculation (min): 70 min   Short Term Goals: Week 2:  OT Short Term Goal 1 (Week 2): Pt will be able to stand at toilet with mod A to support balance to allow her to cleanse and manage clothing. OT Short Term Goal 2 (Week 2): Pt will be able to don shirt with min A. OT Short Term Goal 3 (Week 2): Pt will be able to transfer on and off toilet with mod A of 1.  Skilled Therapeutic Interventions/Progress Updates:    Pt greeted in w/c, agreeable to shower. Due to new knee brace in room, tried stand step transfers to TTB, which pt completed with Mod A. Pt bathing at sit<stand level, using bath mitt for Rt UE. Active assist with Lt for using it functionally to thoroughly clean UB. HOH for washing Lt side, also for placement of Rt LE in figure 4 to wash foot. Pt then stood on small platform with OT in front, supporting Rt side while pt completed pericare. She was proud that she could reach herself! Afterwards pt completed stand step back to w/c while turning on small platform (Max A with brace donned). Transitioned to dressing w/c level at sink. Daughter (caregiver) present for education regarding hemi techniques and Rt UE joint protection. Pt able to complete 4/4 components of donning shirt (!) thread LEs into pants, and assist with elevating pants on Lt side while standing at sink (Mod A for supporting Rt side). Dependent for footwear/donning Rt knee brace. Pt was left in w/c, repositioned to protect hemiplegic side with half lap tray. Daughter verbalized she would notify staff when she left to apply safety belt.   Interpretor present during session  Therapy Documentation Precautions:  Precautions Precautions: Fall Precaution Comments: Rt hemi, Rt inattention, interpretor needs (Spanish)  Restrictions Weight  Bearing Restrictions: No Pain: No c/o pain during tx    ADL: ADL ADL Comments: Please see functional navigator for ADL status     See Function Navigator for Current Functional Status.   Therapy/Group: Individual Therapy  Apolo Cutshaw A Tamsin Nader 09/20/2017, 12:26 PM

## 2017-09-20 NOTE — Progress Notes (Signed)
Patient ID: Cindy Thornton, female   DOB: 14-Feb-1951, 66 y.o.   MRN: [id number]   09/20/2017.  Cindy Thornton is a 66 y.o. 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  Past Medical History:  Diagnosis Date  . Diabetes mellitus without complication (HCC)   . Hyperlipidemia   . Hypertension      Subjective: No new complaints. No new problems. Slept well.  Daughter at bedside to interpret  Objective: Vital signs in last 24 hours: Temp:  [98 F (36.7 C)-98.4 F (36.9 C)] 98 F (36.7 C) (12/29 0600) Pulse Rate:  [67-74] 67 (12/29 0600) Resp:  [17-18] 18 (12/29 0600) BP: (136-143)/(40-42) 136/40 (12/29 0600) SpO2:  [97 %-99 %] 97 % (12/29 0600) Weight change:  Last BM Date: 09/19/17  Intake/Output from previous day: 12/28 0701 - 12/29 0700 In: 360 [P.O.:360] Out: -  Last cbgs: CBG (last 3)  Recent Labs    09/19/17 1642 09/19/17 2142 09/20/17 0638  GLUCAP 132* 104* 111*     Physical Exam General: No apparent distress   HEENT: Unremarkable Lungs: Normal effort. Lungs clear to auscultation, no crackles or wheezes. Cardiovascular: Regular rate and rhythm, no edema Abdomen: S/NT/ND; BS(+) Musculoskeletal:  unchanged Neurological: No new neurological deficits; right hemiparesis, arm> leg; right facial weakness Wounds: N/A    Skin: clear   Mental state: Alert, oriented, cooperative  Patient Vitals for the past 24 hrs:  BP Temp Temp src Pulse Resp SpO2  09/20/17 0600 (!) 136/40 98 F (36.7 C) Oral 67 18 97 %  09/19/17 1446 (!) 143/42 98.4 F (36.9 C) Oral 74 17 99 %    Lab Results: BMET    Component Value Date/Time   NA 140 09/17/2017 0537   K 4.1 09/17/2017 0537   CL 103 09/17/2017 0537   CO2 27 09/17/2017 0537   GLUCOSE 115 (H) 09/17/2017 0537   BUN 29 (H) 09/17/2017 0537   CREATININE 0.93 09/17/2017 0537   CALCIUM 9.6 09/17/2017 0537   GFRNONAA >60 09/17/2017 0537   GFRAA >60 09/17/2017 0537   CBC    Component Value Date/Time   WBC 9.2 09/15/2017 1104   RBC 4.56 09/15/2017 1104   HGB 13.1 09/15/2017 1104   HCT 39.6 09/15/2017 1104   PLT 184 09/15/2017 1104   MCV 86.8 09/15/2017 1104   MCH 28.7 09/15/2017 1104   MCHC 33.1 09/15/2017 1104   RDW 13.1 09/15/2017 1104   LYMPHSABS 2.9 09/15/2017 1104   MONOABS 0.6 09/15/2017 1104   EOSABS 0.2 09/15/2017 1104   BASOSABS 0.0 09/15/2017 1104     Medications: I have reviewed the patient's current medications.  Assessment/Plan:  Functional deficits secondary to left basal ganglia hemorrhagic infarct.  Continue CIR DVT prophylaxis.  Continue SCDs Hypertension.  Reasonable control Diabetes mellitus.  Metformin resumed.  Nice glycemic control    Length of stay, days: 14  Rogelia BogaKWIATKOWSKI,PETER FRANK , MD 09/20/2017, 9:48 AM

## 2017-09-21 ENCOUNTER — Inpatient Hospital Stay (HOSPITAL_COMMUNITY): Payer: Self-pay

## 2017-09-21 LAB — GLUCOSE, CAPILLARY
GLUCOSE-CAPILLARY: 137 mg/dL — AB (ref 65–99)
Glucose-Capillary: 121 mg/dL — ABNORMAL HIGH (ref 65–99)
Glucose-Capillary: 208 mg/dL — ABNORMAL HIGH (ref 65–99)
Glucose-Capillary: 86 mg/dL (ref 65–99)

## 2017-09-21 NOTE — Progress Notes (Signed)
Patient ID: Cindy Thornton, female   DOB: 02-Nov-1950, 66 y.o.   MRN: [id number]   Cindy Thornton is a 66 y.o. 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  Past Medical History:  Diagnosis Date  . Diabetes mellitus without complication (HCC)   . Hyperlipidemia   . Hypertension      Subjective: No new complaints. No new problems.  Aphasic; daughter not present at bedside this a.m. to assist with communication  Objective: Vital signs in last 24 hours: Temp:  [98.5 F (36.9 C)-98.6 F (37 C)] 98.5 F (36.9 C) (12/30 0501) Pulse Rate:  [72-73] 73 (12/30 0501) Resp:  [17-18] 18 (12/30 0501) BP: (134-156)/(45-53) 143/50 (12/30 0501) SpO2:  [97 %-99 %] 97 % (12/30 0501) Weight change:  Last BM Date: 09/20/17  Intake/Output from previous day: 12/29 0701 - 12/30 0700 In: 200 [P.O.:200] Out: -  Last cbgs: CBG (last 3)  Recent Labs    09/20/17 1648 09/20/17 2035 09/21/17 0646  GLUCAP 161* 118* 121*     Physical Exam General: No apparent distress   HEENT: not dry Lungs: Normal effort. Lungs clear to auscultation, no crackles or wheezes. Cardiovascular: Regular rate and rhythm, no edema Abdomen: S/NT/ND; BS(+) Musculoskeletal:  unchanged Neurological: No new neurological deficits; alert with right hemiparesis; aphasic Wounds: N/A    Skin: clear   Mental state: Alert, oriented, cooperative  Patient Vitals for the past 24 hrs:  BP Temp Temp src Pulse Resp SpO2  09/21/17 0501 (!) 143/50 98.5 F (36.9 C) Oral 73 18 97 %  09/20/17 2020 (!) 156/45 - - 73 - -  09/20/17 1345 (!) 134/53 98.6 F (37 C) Oral 72 17 99 %  .  Controlled  Lab Results: BMET    Component Value Date/Time   NA 140 09/17/2017 0537   K 4.1 09/17/2017 0537   CL 103 09/17/2017 0537   CO2 27 09/17/2017 0537   GLUCOSE 115 (H) 09/17/2017 0537   BUN 29 (H) 09/17/2017 0537   CREATININE 0.93 09/17/2017 0537   CALCIUM 9.6  09/17/2017 0537   GFRNONAA >60 09/17/2017 0537   GFRAA >60 09/17/2017 0537   CBC    Component Value Date/Time   WBC 9.2 09/15/2017 1104   RBC 4.56 09/15/2017 1104   HGB 13.1 09/15/2017 1104   HCT 39.6 09/15/2017 1104   PLT 184 09/15/2017 1104   MCV 86.8 09/15/2017 1104   MCH 28.7 09/15/2017 1104   MCHC 33.1 09/15/2017 1104   RDW 13.1 09/15/2017 1104   LYMPHSABS 2.9 09/15/2017 1104   MONOABS 0.6 09/15/2017 1104   EOSABS 0.2 09/15/2017 1104   BASOSABS 0.0 09/15/2017 1104     Medications: I have reviewed the patient's current medications.  Assessment/Plan:  Functional deficits secondary to left basal ganglia hemorrhagic infarct with right hemiparesis.  Continue CIR DVT prophylaxis.  Continue SCDs Essential hypertension.  Controlled Diabetes mellitus.  Continue metformin.  Nice glycemic control    Length of stay, days: 15  Rogelia BogaKWIATKOWSKI,PETER FRANK , MD 09/21/2017, 9:41 AM

## 2017-09-21 NOTE — Progress Notes (Signed)
Physical Therapy Session Note  Patient Details  Name: Cindy Thornton MRN: [id number] Date of Birth: 12-Apr-1951  Today's Date: 09/21/2017 PT Individual Time: 1515-1600 PT Individual Time Calculation (min): 45 min    Skilled Therapeutic Interventions/Progress Updates:    Pt seated in w/c upon PT arrival, agreeable to therapy tx and denies pain. Pt propelled w/c from room>gym x150 ft with min assist and verbal cues for techniques. Pt performed x 5 sit<>stands with emphasis on symmetric weightbearing through LEs, manual facilitation to encourage use of R LE. Pt worked on standing balance without UE support emphasis on maintaining R knee extension, using mirror for visual feedback in order to maintain midline/symmetric posture, x 3 bouts 30-60 sec. Pt performed pre-gait stepping forward/backward in place with each LE x 5 using RW and mod assist, focus on technique and R hip/knee extension. Pt left seated in w/c at end of session with QRB in place.   Therapy Documentation Precautions:  Precautions Precautions: Fall Precaution Comments: Rt hemi, Rt inattention, interpretor needs (Spanish)  Restrictions Weight Bearing Restrictions: No   See Function Navigator for Current Functional Status.   Therapy/Group: Individual Therapy  Cresenciano GenreEmily van Schagen, PT, DPT 09/21/2017, 4:53 PM

## 2017-09-22 ENCOUNTER — Inpatient Hospital Stay (HOSPITAL_COMMUNITY): Payer: Self-pay

## 2017-09-22 ENCOUNTER — Inpatient Hospital Stay (HOSPITAL_COMMUNITY): Payer: Self-pay | Admitting: Occupational Therapy

## 2017-09-22 LAB — BASIC METABOLIC PANEL
ANION GAP: 8 (ref 5–15)
BUN: 28 mg/dL — ABNORMAL HIGH (ref 6–20)
CHLORIDE: 106 mmol/L (ref 101–111)
CO2: 23 mmol/L (ref 22–32)
Calcium: 9.9 mg/dL (ref 8.9–10.3)
Creatinine, Ser: 0.93 mg/dL (ref 0.44–1.00)
GFR calc non Af Amer: >60 mL/min (ref >60)
GLUCOSE: 201 mg/dL — AB (ref 65–99)
Potassium: 3.4 mmol/L — ABNORMAL LOW (ref 3.5–5.1)
Sodium: 137 mmol/L (ref 135–145)

## 2017-09-22 LAB — CBC WITH DIFFERENTIAL/PLATELET
BASOS PCT: 1 %
Basophils Absolute: 0 10*3/uL (ref 0.0–0.1)
Eosinophils Absolute: 0.2 10*3/uL (ref 0.0–0.7)
Eosinophils Relative: 3 %
HEMATOCRIT: 38.5 % (ref 36.0–46.0)
HEMOGLOBIN: 12.6 g/dL (ref 12.0–15.0)
LYMPHS ABS: 2.4 10*3/uL (ref 0.7–4.0)
LYMPHS PCT: 28 %
MCH: 28.6 pg (ref 26.0–34.0)
MCHC: 32.7 g/dL (ref 30.0–36.0)
MCV: 87.3 fL (ref 78.0–100.0)
MONO ABS: 0.5 10*3/uL (ref 0.1–1.0)
MONOS PCT: 6 %
NEUTROS ABS: 5.4 10*3/uL (ref 1.7–7.7)
NEUTROS PCT: 62 %
Platelets: 170 10*3/uL (ref 150–400)
RBC: 4.41 MIL/uL (ref 3.87–5.11)
RDW: 13.6 % (ref 11.5–15.5)
WBC: 8.6 10*3/uL (ref 4.0–10.5)

## 2017-09-22 LAB — GLUCOSE, CAPILLARY
GLUCOSE-CAPILLARY: 188 mg/dL — AB (ref 65–99)
Glucose-Capillary: 98 mg/dL (ref 65–99)
Glucose-Capillary: 104 mg/dL — ABNORMAL HIGH (ref 65–99)
Glucose-Capillary: 134 mg/dL — ABNORMAL HIGH (ref 65–99)

## 2017-09-22 NOTE — Progress Notes (Signed)
[facility] PHYSICAL MEDICINE & REHABILITATION     PROGRESS NOTE   Subjective/Complaints: Pt seen lying in bed this AM.  She slept well overnight and appears to states she had a good weekend.   ROS: Limited due to language, but appears to deny CP, SOB, nausea, vomiting, diarrhea.  Objective: Vital Signs: Blood pressure (!) 134/47, pulse 82, temperature 98.3 F (36.8 C), temperature source Oral, resp. rate 18, height 5' (1.524 m), weight 69.6 kg (153 lb 7 oz), SpO2 96 %. No results found. No results for input(s): WBC, HGB, HCT, PLT in the last 72 hours. No results for input(s): NA, K, CL, GLUCOSE, BUN, CREATININE, CALCIUM in the last 72 hours.  Invalid input(s): CO CBG (last 3)  Recent Labs    09/21/17 1629 09/21/17 2055 09/22/17 0635  GLUCAP 86 137* 98    Wt Readings from Last 3 Encounters:  09/06/17 69.6 kg (153 lb 7 oz)  09/02/17 70.8 kg (156 lb 1.4 oz)    Physical Exam:  Constitutional: She appears well-developed and well-nourished. No distress.  HENT: Normocephalic and atraumatic.  Eyes: EOMI. No discharge.  Cardiovascular: RRR. No JVD  Respiratory: CTA Bilaterally. Normal effort.  GI: Bowel sounds are normal. She exhibits no distension.  Musculoskeletal: She exhibits no edema or tenderness.  Neurological: She is alert.  Right facial weakness  Speech dysarthric     Has difficulty following simple motor commands (?language) Motor: Right upper extremity: Shoulder abduction 1+/5, distally 0/5 (stable).   mAS 1/4 right elbow flexors Right lower extremity: 4--4/5 proximal to distal  Skin: Skin is warm and dry. She is not diaphoretic.  Psychiatric: Limited due to language  Assessment/Plan: 1.  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 Physiatrist is providing close team supervision and 24 hour management of active medical  problems listed below. Physiatrist and rehab team continue to assess barriers to discharge/monitor patient progress toward functional and medical goals.  Function:  Bathing Bathing position   Position: Shower  Bathing parts Body parts bathed by patient: Chest, Abdomen, Front perineal area, Right upper leg, Left upper leg, Right arm, Buttocks Body parts bathed by helper: Right lower leg, Left lower leg, Back, Left arm  Bathing assist Assist Level: Touching or steadying assistance(Pt > 75%)      Upper Body Dressing/Undressing Upper body dressing   What is the patient wearing?: Pull over shirt/dress   Bra - Perfomed by helper: Thread/unthread right bra strap, Thread/unthread left bra strap, Hook/unhook bra (pull down sports bra) Pull over shirt/dress - Perfomed by patient: Thread/unthread left sleeve, Put head through opening, Thread/unthread right sleeve, Pull shirt over trunk Pull over shirt/dress - Perfomed by helper: Pull shirt over trunk        Upper body assist Assist Level: Supervision or verbal cues      Lower Body Dressing/Undressing Lower body dressing   What is the patient wearing?: Pants, Underwear, Socks, Shoes     Pants- Performed by patient: Thread/unthread right pants leg, Thread/unthread left pants leg Pants- Performed by helper: Pull pants up/down   Non-skid slipper socks- Performed by helper: Don/doff right sock, Don/doff left sock Socks - Performed by patient: Don/doff left sock Socks - Performed by helper: Don/doff right sock, Don/doff left sock Shoes - Performed by patient: (incorrect entry on 09/08/17, should have been helper A) Shoes - Performed by helper: Don/doff right shoe, Don/doff left shoe, Fasten  right, Fasten left       TED Hose - Performed by helper: Don/doff right TED hose, Don/doff left TED hose  Lower body assist Assist for lower body dressing: Touching or steadying assistance (Pt > 75%)      Toileting Toileting Toileting activity did  not occur: No continent bowel/bladder event Toileting steps completed by patient: Performs perineal hygiene Toileting steps completed by helper: Adjust clothing prior to toileting, Performs perineal hygiene, Adjust clothing after toileting Toileting Assistive Devices: Grab bar or rail  Toileting assist Assist level: Two helpers   Transfers Chair/bed transfer   Chair/bed transfer method: Stand pivot Chair/bed transfer assist level: Moderate assist (Pt 50 - 74%/lift or lower) Chair/bed transfer assistive device: Armrests     Locomotion Ambulation     Max distance: 15' Assist level: Maximal assist (Pt 25 - 49%)   Wheelchair Wheelchair activity did not occur: (unable secondary to height) Type: Manual Max wheelchair distance: 150 Assist Level: Touching or steadying assistance (Pt > 75%)  Cognition Comprehension Comprehension assist level: Understands basic 50 - 74% of the time/ requires cueing 25 - 49% of the time  Expression Expression assist level: Expresses basic 50 - 74% of the time/requires cueing 25 - 49% of the time. Needs to repeat parts of sentences.  Social Interaction Social Interaction assist level: Interacts appropriately 50 - 74% of the time - May be physically or verbally inappropriate.  Problem Solving Problem solving assist level: Solves basic 50 - 74% of the time/requires cueing 25 - 49% of the time  Memory Memory assist level: Recognizes or recalls 50 - 74% of the time/requires cueing 25 - 49% of the time   Medical Problem List and Plan: 1.  Functional, cognitive and mobility deficits  secondary to hemorrhagic left basal ganglia infarct    Cont CIR   WHO, ROM 2.  DVT Prophylaxis/Anticoagulation: Mechanical: Sequential compression devices, below knee Bilateral lower extremities   Dopplers neg for DVT 3. Pain Management: N/A 4. Mood: LCSW to follow for evaluation and support as mentation improves.  5. Neuropsych: This patient is not capable of making decisions on her  own behalf. 6. Skin/Wound Care: routine pressure relief measures.  7. Fluids/Electrolytes/Nutrition: Encourage oral intake.   BMP within acceptable limits on 12/26   Labs pending 8. HTN: Monitor BP qid.    Lisinopril increased to 20 mg BID on 12/19   HCTZ DC'd and chlorthalidone started on 12/23   Cont metoprolol.    Monitor renal status while on nectars.   Relatively controlled on 12/31 9. Leukocytosis: Resolved 10 Dysphagia:    Advanced to D3 thin, cont to advance as tolerated 11. Dyslipidemia: On Lipitor  12. T2DM: Hgb A1C-7.1.  Monitor BS ac/hs. Resume metformin.  CBG (last 3)  Recent Labs    09/21/17 1629 09/21/17 2055 09/22/17 0635  GLUCAP 86 137* 98    Relatively controlled on 12/31  LOS (Days) 16 A FACE TO FACE EVALUATION WAS PERFORMED  Lowery Paullin Karis JubaAnil Daimen Shovlin, MD 09/22/2017 10:24 AM

## 2017-09-22 NOTE — Progress Notes (Signed)
Speech Language Pathology Weekly Progress and Session Note  Patient Details  Name: Cindy Thornton MRN: [id number] Date of Birth: 02-01-1951  Beginning of progress report period: September 15, 2017 End of progress report period: September 22, 2017  Today's Date: 09/22/2017 SLP Individual Time: 1345-1414 SLP Individual Time Calculation (min): 29 min  Short Term Goals: Week 2: SLP Short Term Goal 1 (Week 2): Pt will follow 2 step simple directions with Min A cues to achieve ~ 75% accuracy.  SLP Short Term Goal 1 - Progress (Week 2): Not met SLP Short Term Goal 2 (Week 2): Pt will utilize an increased vocal intensity at the word level to achieve increased intelligibility of ~ 90% with Min A cues.  SLP Short Term Goal 2 - Progress (Week 2): Not met SLP Short Term Goal 3 (Week 2): Pt will answer yes/no questions about current physical and aphasic condition to demonstrate intellectual awareness with ~ 75% accuracy and Min A cues.  SLP Short Term Goal 3 - Progress (Week 2): Not met SLP Short Term Goal 4 (Week 2): Pt will complete rote/automatic verbal sequences with Mod A cues.  SLP Short Term Goal 4 - Progress (Week 2): Met SLP Short Term Goal 5 (Week 2): Pt will produce 2 to 3 word utterances during structured verbal tasks with Mod A cues.  SLP Short Term Goal 5 - Progress (Week 2): Met SLP Short Term Goal 6 (Week 2): Pt will consume thin liquids and dysphagia 3 with minimal overt s/s of aspiration and Min A cues for use of compensatory strategies.  SLP Short Term Goal 6 - Progress (Week 2): Met    New Short Term Goals: Week 3: SLP Short Term Goal 1 (Week 3): STG=LTG due to ELOS  Weekly Progress Updates: Pt demonstrated moderate progress meeting 3 out 6 goals, requiring a reduction in cues for swallow strategies, producing 2-3 word utterances and completing rote/automatic verbal sequences. SLP will continue to provided skilled observation during PO consumption with goal to limit need for  supervision during meals, continue to address insight into deficits with its impact on safety, continued expression of automatic verbal sequences, ability to express want/needs via yes/no and multimodal communication, functional auditory comprehension and vocal intensity strategies.Pt would continue to benefit from skilled ST services in order to maximize functional independence and reduce burden of care.     Intensity: Minumum of 1-2 x/day, 30 to 90 minutes Frequency: 3 to 5 out of 7 days Duration/Length of Stay: 1/8 Treatment/Interventions: Cueing hierarchy;Functional tasks;Multimodal communication approach;Patient/family education;Cognitive remediation/compensation   Daily Session  Skilled Therapeutic Interventions: Skilled ST services focused on cognitive and swallow skills. SLP facilitated PO consumption of dys 3 and thin liquid via cup, pt required supervision A verbal/visual cues to utilize swallow startegies and required max A verbal cues to recall that it is currently unsafe for pt to use a straw. Pt demonstrated no overt s/s aspiration.  SLP facilitated auditory comprehension following 1 step directions with min A visual cues and 2 stpe directions required mod A verbal/visual cues. Pt required Mod A verbal cues to respond to yes/no questions pertaining to current deficits and insight into deficits. SLP facilitated expressive respond to rote verbal sequences, counting to 19, and completing familiar phrases with mod A verbal cues. Interpreter was present during session and aid in verbal communication. Pt was left in room with interpreter. Recommend to continue skilled ST services..     Function:   Eating Eating   Modified Consistency Diet: Yes  Eating Assist Level: More than reasonable amount of time;Set up assist for;Supervision or verbal cues   Eating Set Up Assist For: Opening containers       Cognition Comprehension Comprehension assist level: Understands basic 50 - 74% of the  time/ requires cueing 25 - 49% of the time  Expression Expression assistive device: Other (Comment)(interperter) Expression assist level: Expresses basic 25 - 49% of the time/requires cueing 50 - 75% of the time. Uses single words/gestures.  Social Interaction Social Interaction assist level: Interacts appropriately 50 - 74% of the time - May be physically or verbally inappropriate.  Problem Solving Problem solving assist level: Solves basic 50 - 74% of the time/requires cueing 25 - 49% of the time  Memory Memory assist level: Recognizes or recalls 50 - 74% of the time/requires cueing 25 - 49% of the time   General    Pain Pain Assessment Pain Assessment: No/denies pain  Therapy/Group: Individual Therapy  [place]  [facility] 09/22/2017, 3:28 PM

## 2017-09-22 NOTE — Progress Notes (Signed)
Physical Therapy Session Note  Patient Details  Name: Cindy Thornton MRN: [id number] Date of Birth: May 24, 1951  Today's Date: 09/22/2017 PT Individual Time: 1000-1057 PT Individual Time Calculation (min): 57 min    Skilled Therapeutic Interventions/Progress Updates:    Pt received from OT in the gym upon PT arrival, denies pain and agreeable to therapy tx. Interpretor present throughout session. Pt performed x 5 sit<>stands without UE support, focus on symmetric weightbearing through LEs for R LE NMR, manual facilitation for R knee extension in standing to prevent buckling. Pt attempted to perform lateral step ups using RW and R LE on 2 inch step, unable to extend R hip/knee and instead with increased flexion. Therapist performed 2 x 30 sec manual hamstring stretches bilaterally. Pt transferred from mat>w/c stand pivot with mod assist. Pt transferred sitting>supine with min assist. In supine pt performed exercises for R LE neuro re-ed with focus on isolated muscle activation including: R hip abduction with manual resistance in hooklying, birdges with B LEs and with R LE only, R SAQ and PNF D2 extension with manual resistance using R LE. Pt transferred supine>sitting EOB with min assist. Pt transferred back to w/c with mod assist, stand pivot. Pt worked on dynamic standing balance without UE support to reach for cups on L side and place on table on R side. Pt transported back to room in w/c, left seated in w/c with needs in reach.   Therapy Documentation Precautions:  Precautions Precautions: Fall Precaution Comments: Rt hemi, Rt inattention, interpretor needs (Spanish)  Restrictions Weight Bearing Restrictions: No     See Function Navigator for Current Functional Status.   Therapy/Group: Individual Therapy  Cresenciano GenreEmily van Schagen, PT, DPT 09/22/2017, 10:23 AM

## 2017-09-22 NOTE — Progress Notes (Signed)
Occupational Therapy Session Note  Patient Details  Name: Cindy Thornton MRN: [id number] Date of Birth: 07/17/1951  Today's Date: 09/22/2017 OT Individual Time: 0902-1000 OT Individual Time Calculation (min): 58 min    Short Term Goals: Week 2:  OT Short Term Goal 1 (Week 2): Pt will be able to stand at toilet with mod A to support balance to allow her to cleanse and manage clothing. OT Short Term Goal 2 (Week 2): Pt will be able to don shirt with min A. OT Short Term Goal 3 (Week 2): Pt will be able to transfer on and off toilet with mod A of 1.  Skilled Therapeutic Interventions/Progress Updates:    Pt received sitting up in w/c with no c/o pain, agreeable to OT tx session. Pt declines bathing this session, stating she is too cold, agreeable to dressing ADLs. Pt dons overhead shirt with overall supervision though requiring Max verbal cues to initiate use of hemi technique. Once Pt begins use of technique she is able to demonstrate correct sequence/technique without assist. Pt doffs/dons pants with ModA for sit<>stand at sink; While doffing pajamas Pt is able to pull pants over hips, requires assist to advance pants over L hip when donning new pair with Mod steadying assist throughout. Pt able to thread pants over LEs with therapist supporting RLE in figure 4 position. Pt dons L shoe, able to don R shoe with therapist supporting RLE in figure 4. Pt self propels w/c to therapy gym using hemi technique. Focus on sit<>stand from w/c and static/dynamic activity without UE support. Pt completes sit<>stand x2 with Min-ModA; maintains static standing with MinA and manual facilitation at RLE to prevent knee buckling; Pt requires Mod-MaxA to maintain standing balance during dynamic activity obtaining and placing cups on table to the R. Handoff to PT end of session for start of session.   Therapy Documentation Precautions:  Precautions Precautions: Fall Precaution Comments: Rt hemi, Rt inattention,  interpretor needs (Spanish)  Restrictions Weight Bearing Restrictions: No    ADL: ADL ADL Comments: Please see functional navigator for ADL status  See Function Navigator for Current Functional Status.   Therapy/Group: Individual Therapy  Orlando PennerBreanna L Rayson Rando 09/22/2017, 12:04 PM

## 2017-09-22 NOTE — Progress Notes (Signed)
Physical Therapy Note  Patient Details  Name: Cindy Thornton MRN: [id number] Date of Birth: 10-Oct-1950 Today's Date: 09/22/2017  1300-1345, 45 min individual tx Pain:none per pt  W/c propulsion using hemi method over level tile x 150' without cues or assistance.  neuromuscular re-education via forced use, mulimodal cues for alternating reciprocal movement x bil LE s, seated in w/c, using Kinetron at level 50 x 25 cycles x 2; 15 x 1 bil hip abduction against resistance band  PT donned neoprene knee brace.  Sit> stand at wall railing with min assist for bil mini squats with L hand support, max cues.  Gait training at hallway railing with R knee brace, x 20' with mod assist for R knee control, upright trunk, wt shifting.  [place],  SLP picked up pt for next therapy.    See function navigator for current status.  Philbert Ocallaghan 09/22/2017, 12:32 PM

## 2017-09-23 LAB — GLUCOSE, CAPILLARY
GLUCOSE-CAPILLARY: 111 mg/dL — AB (ref 65–99)
GLUCOSE-CAPILLARY: 149 mg/dL — AB (ref 65–99)
GLUCOSE-CAPILLARY: 106 mg/dL — AB (ref 65–99)
Glucose-Capillary: 104 mg/dL — ABNORMAL HIGH (ref 65–99)

## 2017-09-23 MED ORDER — POTASSIUM CHLORIDE CRYS ER 20 MEQ PO TBCR
40.0000 meq | EXTENDED_RELEASE_TABLET | Freq: Once | ORAL | Status: AC
  Administered 2017-09-23: 40 meq via ORAL
  Filled 2017-09-23: qty 2

## 2017-09-23 NOTE — Progress Notes (Signed)
[facility]IC[facility] & REHABILITATION     PROGRESS NOTE   Subjective/Complaints: Pt seen lying in bed this AM.  She slept well overnight.  She does not have any questions.   ROS: Limited due to language, but appears to deny CP, SOB, nausea, vomiting, diarrhea.  Objective: Vital Signs: Blood pressure (!) 149/50, pulse 72, temperature 98.3 F (36.8 C), temperature source Oral, resp. rate 18, height 5' (1.524 m), weight 69.6 kg (153 lb 7 oz), SpO2 97 %. No results found. Recent Labs    09/22/17 1124  WBC 8.6  HGB 12.6  HCT 38.5  PLT 170   Recent Labs    09/22/17 1124  NA 137  K 3.4*  CL 106  GLUCOSE 201*  BUN 28*  CREATININE 0.93  CALCIUM 9.9   CBG (last 3)  Recent Labs    09/22/17 1639 09/22/17 2123 09/23/17 0635  GLUCAP 104* 134* 104*    Wt Readings from Last 3 Encounters:  09/06/17 69.6 kg (153 lb 7 oz)  09/02/17 70.8 kg (156 lb 1.4 oz)    Physical Exam:  Constitutional: She appears well-developed and well-nourished. No distress.  HENT: Normocephalic and atraumatic.  Eyes: EOMI. No discharge.  Cardiovascular: RRR. No JVD  Respiratory: CTA Bilaterally. Normal effort.  GI: Bowel sounds are normal. She exhibits no distension.  Musculoskeletal: She exhibits no edema or tenderness.  Neurological: She is alert.  Right facial weakness  Speech dysarthric     Has difficulty following simple motor commands (?language) Motor: Right upper extremity: Shoulder abduction 1+/5, distally 0/5 (unchanged).   No increase in tone Right lower extremity: 4--4/5 proximal to distal  Skin: Skin is warm and dry. She is not diaphoretic.  Psychiatric: Limited due to language  Assessment/Plan: 1.  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 Physiatrist is providing close team supervision and 24 hour management of active medical  problems listed below. Physiatrist and rehab team continue to assess barriers to discharge/monitor patient progress toward functional and medical goals.  Function:  Bathing Bathing position   Position: Shower  Bathing parts Body parts bathed by patient: Chest, Abdomen, Front perineal area, Right upper leg, Left upper leg, Right arm, Buttocks Body parts bathed by helper: Right lower leg, Left lower leg, Back, Left arm  Bathing assist Assist Level: Touching or steadying assistance(Pt > 75%)      Upper Body Dressing/Undressing Upper body dressing   What is the patient wearing?: Pull over shirt/dress   Bra - Perfomed by helper: Thread/unthread right bra strap, Thread/unthread left bra strap, Hook/unhook bra (pull down sports bra) Pull over shirt/dress - Perfomed by patient: Thread/unthread left sleeve, Put head through opening, Thread/unthread right sleeve, Pull shirt over trunk Pull over shirt/dress - Perfomed by helper: Pull shirt over trunk        Upper body assist Assist Level: Supervision or verbal cues      Lower Body Dressing/Undressing Lower body dressing   What is the patient wearing?: Pants, Shoes     Pants- Performed by patient: Thread/unthread right pants leg, Thread/unthread left pants leg Pants- Performed by helper: Pull pants up/down   Non-skid slipper socks- Performed by helper: Don/doff right sock, Don/doff left sock Socks - Performed by patient: Don/doff left sock Socks - Performed by helper: Don/doff right sock, Don/doff left sock Shoes - Performed by patient: Don/doff left shoe, Don/doff right shoe Shoes -  Performed by helper: Fasten right       TED Hose - Performed by helper: Don/doff right TED hose, Don/doff left TED hose  Lower body assist Assist for lower body dressing: Touching or steadying assistance (Pt > 75%)      Toileting Toileting Toileting activity did not occur: No continent bowel/bladder event Toileting steps completed by patient: Performs  perineal hygiene Toileting steps completed by helper: Adjust clothing prior to toileting, Performs perineal hygiene, Adjust clothing after toileting Toileting Assistive Devices: Grab bar or rail  Toileting assist Assist level: Two helpers   Transfers Chair/bed transfer   Chair/bed transfer method: Stand pivot Chair/bed transfer assist level: Moderate assist (Pt 50 - 74%/lift or lower) Chair/bed transfer assistive device: Armrests     Locomotion Ambulation     Max distance: 20 Assist level: Moderate assist (Pt 50 - 74%)   Wheelchair Wheelchair activity did not occur: (unable secondary to height) Type: Manual Max wheelchair distance: 150 Assist Level: Supervision or verbal cues  Cognition Comprehension Comprehension assist level: Understands basic 50 - 74% of the time/ requires cueing 25 - 49% of the time  Expression Expression assist level: Expresses basic 25 - 49% of the time/requires cueing 50 - 75% of the time. Uses single words/gestures.  Social Interaction Social Interaction assist level: Interacts appropriately 50 - 74% of the time - May be physically or verbally inappropriate.  Problem Solving Problem solving assist level: Solves basic 50 - 74% of the time/requires cueing 25 - 49% of the time  Memory Memory assist level: Recognizes or recalls 50 - 74% of the time/requires cueing 25 - 49% of the time   Medical Problem List and Plan: 1.  Functional, cognitive and mobility deficits  secondary to hemorrhagic left basal ganglia infarct    Cont CIR   WHO, ROM 2.  DVT Prophylaxis/Anticoagulation: Mechanical: Sequential compression devices, below knee Bilateral lower extremities   Dopplers neg for DVT 3. Pain Management: N/A 4. Mood: LCSW to follow for evaluation and support as mentation improves.  5. Neuropsych: This patient is not capable of making decisions on her own behalf. 6. Skin/Wound Care: routine pressure relief measures.  7. Fluids/Electrolytes/Nutrition: Encourage  oral intake. 8. HTN: Monitor BP qid.    Lisinopril increased to 20 mg BID on 12/19   HCTZ DC'd and chlorthalidone started on 12/23   Cont metoprolol.    Monitor renal status while on nectars.   Relatively controlled on 1/1 9. Leukocytosis: Resolved 10 Dysphagia:    Advanced to D3 thin, cont to advance as tolerated 11. Dyslipidemia: On Lipitor  12. T2DM: Hgb A1C-7.1.  Monitor BS ac/hs. Resume metformin.  CBG (last 3)  Recent Labs    09/22/17 1639 09/22/17 2123 09/23/17 0635  GLUCAP 104* 134* 104*    Relatively controlled on 1/1 13. Hypokalemia   K+ 3.4 on 12/31, supplemented x1   Cont to monitor 14. Obesity   Body mass index is 29.97 kg/m.  Diet and exercise education  Encourage weight loss to increase endurance and promote overall health   LOS (Days) 17 A FACE TO FACE EVALUATION WAS PERFORMED  Tajee Savant Karis Juba, MD 09/23/2017 9:03 AM

## 2017-09-24 ENCOUNTER — Inpatient Hospital Stay (HOSPITAL_COMMUNITY): Payer: Self-pay | Admitting: Physical Therapy

## 2017-09-24 ENCOUNTER — Inpatient Hospital Stay (HOSPITAL_COMMUNITY): Payer: Self-pay | Admitting: Speech Pathology

## 2017-09-24 ENCOUNTER — Inpatient Hospital Stay (HOSPITAL_COMMUNITY): Payer: Self-pay | Admitting: Occupational Therapy

## 2017-09-24 DIAGNOSIS — E876 Hypokalemia: Secondary | ICD-10-CM

## 2017-09-24 LAB — BASIC METABOLIC PANEL
Anion gap: 8 (ref 5–15)
BUN: 26 mg/dL — AB (ref 6–20)
CALCIUM: 9.8 mg/dL (ref 8.9–10.3)
CO2: 28 mmol/L (ref 22–32)
CREATININE: 0.92 mg/dL (ref 0.44–1.00)
Chloride: 105 mmol/L (ref 101–111)
GFR calc non Af Amer: >60 mL/min (ref >60)
GLUCOSE: 108 mg/dL — AB (ref 65–99)
Potassium: 4.2 mmol/L (ref 3.5–5.1)
Sodium: 141 mmol/L (ref 135–145)

## 2017-09-24 LAB — GLUCOSE, CAPILLARY
GLUCOSE-CAPILLARY: 129 mg/dL — AB (ref 65–99)
Glucose-Capillary: 109 mg/dL — ABNORMAL HIGH (ref 65–99)
Glucose-Capillary: 115 mg/dL — ABNORMAL HIGH (ref 65–99)
Glucose-Capillary: 115 mg/dL — ABNORMAL HIGH (ref 65–99)

## 2017-09-24 NOTE — Progress Notes (Signed)
Physical Therapy Note  Patient Details  Name: Cindy Thornton MRN: [id number] Date of Birth: 04/10/1951 Today's Date: 09/24/2017    Time: [phone] 55 minutes  1:1 no c/o pain.  W/c mobility throughout unit with supervision with hemi technique.  Standing balance with wt shifts for pre gait with improved posture and Rt knee control, still mod A for Rt knee stability.  Gait with RW and Rt UE splint with mod/max A for postural control, mod A for wt shifts.  Gait with hemi walker 3 x 20' with mod A, improved posture and wt shifts.  Stair negotiation x 4 steps with 1 railing with mod A.  kinetron 2 x 3 minutes for LE strength and coordination.  Pt with much improved mobility and strength.   Montae Stager 09/24/2017, 10:53 AM

## 2017-09-24 NOTE — Progress Notes (Signed)
Orthopedic Tech Progress Note Patient Details:  Cindy PewMaria Parra Thornton 01-22-1951 [id number]  Ortho Devices Type of Ortho Device: Ankle Air splint Ortho Device/Splint Interventions: Application   Post Interventions Patient Tolerated: Well Instructions Provided: Care of device   Saul FordyceJennifer C Kamori Kitchens 09/24/2017, 3:51 PM

## 2017-09-24 NOTE — Progress Notes (Signed)
Speech Language Pathology Daily Session Note  Patient Details  Name: Cindy PewMaria Parra Thornton MRN: [id number] Date of Birth: 10/28/1950  Today's Date: 09/24/2017 SLP Individual Time: 0905-1000 SLP Individual Time Calculation (min): 55 min  Short Term Goals: Week 3: SLP Short Term Goal 1 (Week 3): STG=LTG due to ELOS  Skilled Therapeutic Interventions:   Pt was seen for skilled ST targeting cognitive goals.  Pt was awake, in bed upon therapist's arrival and agreeable to participating in therapy.  Pt was transferred to wheelchair and on an off commode where she was continent of urine.  Pt was able to recall her safety precautions when questioned during transfers with min assist, including swallowing precautions (i.e. no straws recommendation which was discussed in therapy session 2 days ago).  Pt was able to complete basic sinkside ADLs with set up assist only.  She was also able to recall device use for both incentive spirometer and EMST device with supervision.  Give pt's overall improved mentation, recommend advancing her to intermittent supervision during meals.  Pt was left in wheelchair with call bell within reach, quick release belt donned for safety.  Continue per current plan of care.    Function:  Eating Eating                 Cognition Comprehension Comprehension assist level: Understands basic 75 - 89% of the time/ requires cueing 10 - 24% of the time  Expression   Expression assist level: Expresses basic 75 - 89% of the time/requires cueing 10 - 24% of the time. Needs helper to occlude trach/needs to repeat words.  Social Interaction Social Interaction assist level: Interacts appropriately 90% of the time - Needs monitoring or encouragement for participation or interaction.  Problem Solving Problem solving assist level: Solves basic 75 - 89% of the time/requires cueing 10 - 24% of the time  Memory Memory assist level: Recognizes or recalls 75 - 89% of the time/requires cueing 10 -  24% of the time    Pain Pain Assessment Pain Assessment: No/denies pain  Therapy/Group: Individual Therapy  Cameren Earnest, [name] 09/24/2017, 10:03 AM

## 2017-09-24 NOTE — Progress Notes (Signed)
Occupational Therapy Weekly Progress Note  Patient Details  Name: Cindy Thornton MRN: [id number] Date of Birth: August 13, 1951  Beginning of progress report period: September 15, 2017 End of progress report period: September 24, 2017  Today's Date: 09/24/2017 OT Individual Time: 1300-1415 OT Individual Time Calculation (min): 75 min    Patient has met 3 of 3 short term goals.  Pt cont to make slow but steady progress towards OT goals. She cont to be most limited by R hemiplegia UE>LE and poor standing balance/ postural control. Due to pt's body habitus and knee instability, she requires increased assist with LB dressing, toileting tasks and transfers. Overall pt requiring mod A, however, is able to recall and utilzie hemi dressing techniques for increased indepence with tasks.  Will need to begin family training in prep for d/c next week as pt will require physical as well as cognitive assist at d/c from w/c level, standing only for LB dressing/toileting tasks.   Patient continues to demonstrate the following deficits:abnormal posture, cognitive deficits, disturbance of vision, hemiplegia affecting dominant side, muscle weakness (generalized) and coordination disorder and therefore will continue to benefit from skilled OT intervention to enhance overall performance with BADL and Reduce care partner burden.  Patient progressing toward long term goals..  Continue plan of care.  OT Short Term Goals Week 2:  OT Short Term Goal 1 (Week 2): Pt will be able to stand at toilet with mod A to support balance to allow her to cleanse and manage clothing. OT Short Term Goal 1 - Progress (Week 2): Met OT Short Term Goal 2 (Week 2): Pt will be able to don shirt with min A. OT Short Term Goal 2 - Progress (Week 2): Met OT Short Term Goal 3 (Week 2): Pt will be able to transfer on and off toilet with mod A of 1. OT Short Term Goal 3 - Progress (Week 2): Met Week 3:  OT Short Term Goal 1 (Week 3): STG=LTG due to  LOS  Skilled Therapeutic Interventions/Progress Updates:    Pt seen for OT ADL bathing/dressing session. Pt sitting up in w/c upon arrival interpretor present, agreeable to tx session and bathing at shower level. Completed stand pivot transfers throughout session with use of grab bar. Overall completed at min A, however, required mod A for stand/squat pivot out of shower due to R knee instability. She required heavy steadying assist from standing position while managing her clothes in prep for toileting task.  She bathed seated on tub bench, foot step placed under feet for improved sitting balance. She was able to reach towards floor to wash B feet.  She returned to w/c to dress, independently recalling and utilizing hemi dressing techniques. Stood at hemi walker in order to LB dressing to be performed, mod-max A for standing balance with physical assist provided at R knee/ankle from standing position.  Practiced squat pivot transfer w/c <> EOB, min-mod when transferring to L, mod-max when transferring to weaker R side with VCs for sequencing/ technique.  Pt left seated in w/c at end of session, all needs in reach. Reviewed recommendation for no ambulation at d/c, pt voiced understanding. Plan to have family ed this week in prep for d/c home next week.   Therapy Documentation Precautions:  Precautions Precautions: Fall Precaution Comments: Rt hemi, Rt inattention, interpretor needs (Spanish)  Restrictions Weight Bearing Restrictions: No Pain:   No/ denies pain ADL: ADL ADL Comments: Please see functional navigator for ADL status  See Function Navigator  for Current Functional Status.   Therapy/Group: Individual Therapy  Cindy Thornton L 09/24/2017, 7:16 AM

## 2017-09-24 NOTE — Progress Notes (Signed)
[facility] PHYSICAL MEDICINE & REHABILITATION     PROGRESS NOTE   Subjective/Complaints: Pt seen lying in bed this AM.  She slept well overnight.  She denies complaints.  ROS: Limited due to language, but appears to deny CP, SOB, nausea, vomiting, diarrhea.  Objective: Vital Signs: Blood pressure (!) 128/42, pulse 75, temperature 98 F (36.7 C), temperature source Oral, resp. rate 17, height 5' (1.524 m), weight 69.6 kg (153 lb 7 oz), SpO2 99 %. No results found. Recent Labs    09/22/17 1124  WBC 8.6  HGB 12.6  HCT 38.5  PLT 170   Recent Labs    09/22/17 1124  NA 137  K 3.4*  CL 106  GLUCOSE 201*  BUN 28*  CREATININE 0.93  CALCIUM 9.9   CBG (last 3)  Recent Labs    09/23/17 1650 09/23/17 2107 09/24/17 0640  GLUCAP 149* 106* 109*    Wt Readings from Last 3 Encounters:  09/06/17 69.6 kg (153 lb 7 oz)  09/02/17 70.8 kg (156 lb 1.4 oz)    Physical Exam:  Constitutional: She appears well-developed and well-nourished. No distress.  HENT: Normocephalic and atraumatic.  Eyes: EOMI. No discharge.  Cardiovascular: RRR. No JVD  Respiratory: CTA Bilaterally. Normal effort.  GI: Bowel sounds are normal. She exhibits no distension.  Musculoskeletal: She exhibits no edema or tenderness.  Neurological: She is alert.  Right facial weakness  Speech dysarthric     Has difficulty following simple motor commands (?language) Motor: Right upper extremity: Shoulder abduction 1+/5, distally 0/5 (stable).   No increase in tone Right lower extremity: 4--4/5 proximal to distal  Skin: Skin is warm and dry. She is not diaphoretic.  Psychiatric: Limited due to language  Assessment/Plan: 1.  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 Physiatrist is providing close team supervision and 24 hour management of active medical problems listed  below. Physiatrist and rehab team continue to assess barriers to discharge/monitor patient progress toward functional and medical goals.  Function:  Bathing Bathing position   Position: Shower  Bathing parts Body parts bathed by patient: Chest, Abdomen, Front perineal area, Right upper leg, Left upper leg, Right arm, Buttocks Body parts bathed by helper: Right lower leg, Left lower leg, Back, Left arm  Bathing assist Assist Level: Touching or steadying assistance(Pt > 75%)      Upper Body Dressing/Undressing Upper body dressing   What is the patient wearing?: Pull over shirt/dress   Bra - Perfomed by helper: Thread/unthread right bra strap, Thread/unthread left bra strap, Hook/unhook bra (pull down sports bra) Pull over shirt/dress - Perfomed by patient: Thread/unthread left sleeve, Put head through opening, Thread/unthread right sleeve, Pull shirt over trunk Pull over shirt/dress - Perfomed by helper: Pull shirt over trunk        Upper body assist Assist Level: Supervision or verbal cues      Lower Body Dressing/Undressing Lower body dressing   What is the patient wearing?: Pants, Shoes     Pants- Performed by patient: Thread/unthread right pants leg, Thread/unthread left pants leg Pants- Performed by helper: Pull pants up/down   Non-skid slipper socks- Performed by helper: Don/doff right sock, Don/doff left sock Socks - Performed by patient: Don/doff left sock Socks - Performed by helper: Don/doff right sock, Don/doff left sock Shoes - Performed by patient: Don/doff left shoe, Don/doff right shoe Shoes - Performed by helper: Fasten  right       TED Hose - Performed by helper: Don/doff right TED hose, Don/doff left TED hose  Lower body assist Assist for lower body dressing: Touching or steadying assistance (Pt > 75%)      Toileting Toileting Toileting activity did not occur: No continent bowel/bladder event Toileting steps completed by patient: Performs perineal  hygiene Toileting steps completed by helper: Adjust clothing prior to toileting, Performs perineal hygiene, Adjust clothing after toileting Toileting Assistive Devices: Grab bar or rail  Toileting assist Assist level: Two helpers   Transfers Chair/bed transfer   Chair/bed transfer method: Stand pivot Chair/bed transfer assist level: Moderate assist (Pt 50 - 74%/lift or lower) Chair/bed transfer assistive device: Armrests     Locomotion Ambulation     Max distance: 20 Assist level: Moderate assist (Pt 50 - 74%)   Wheelchair Wheelchair activity did not occur: (unable secondary to height) Type: Manual Max wheelchair distance: 150 Assist Level: Supervision or verbal cues  Cognition Comprehension Comprehension assist level: Understands basic 50 - 74% of the time/ requires cueing 25 - 49% of the time  Expression Expression assist level: Expresses basic 50 - 74% of the time/requires cueing 25 - 49% of the time. Needs to repeat parts of sentences.  Social Interaction Social Interaction assist level: Interacts appropriately 50 - 74% of the time - May be physically or verbally inappropriate.  Problem Solving Problem solving assist level: Solves basic 50 - 74% of the time/requires cueing 25 - 49% of the time  Memory Memory assist level: Recognizes or recalls 50 - 74% of the time/requires cueing 25 - 49% of the time   Medical Problem List and Plan: 1.  Functional, cognitive and mobility deficits  secondary to hemorrhagic left basal ganglia infarct    Cont CIR   WHO, ROM 2.  DVT Prophylaxis/Anticoagulation: Mechanical: Sequential compression devices, below knee Bilateral lower extremities   Dopplers neg for DVT 3. Pain Management: N/A 4. Mood: LCSW to follow for evaluation and support as mentation improves.  5. Neuropsych: This patient is not capable of making decisions on her own behalf. 6. Skin/Wound Care: routine pressure relief measures.  7. Fluids/Electrolytes/Nutrition: Encourage oral  intake. 8. HTN: Monitor BP qid.    Lisinopril increased to 20 mg BID on 12/19   HCTZ DC'd and chlorthalidone started on 12/23   Cont metoprolol.    Monitor renal status while on nectars.   Relatively controlled on 1/2 9. Leukocytosis: Resolved 10 Dysphagia:    Advanced to D3 thin, cont to advance as tolerated 11. Dyslipidemia: On Lipitor  12. T2DM: Hgb A1C-7.1.  Monitor BS ac/hs. Resume metformin.  CBG (last 3)  Recent Labs    09/23/17 1650 09/23/17 2107 09/24/17 0640  GLUCAP 149* 106* 109*    Relatively controlled on 1/2 13. Hypokalemia   K+ 3.4 on 12/31, supplemented x1   Cont to monitor   Labs pending 14. Obesity   Body mass index is 29.97 kg/m.  Diet and exercise education  Encourage weight loss to increase endurance and promote overall health   LOS (Days) 18 A FACE TO FACE EVALUATION WAS PERFORMED  Ankit Karis JubaAnil Patel, MD 09/24/2017 8:51 AM

## 2017-09-24 NOTE — Patient Care Conference (Signed)
Inpatient RehabilitationTeam Conference and Plan of Care Update Date: 09/24/2017   Time: 12:45 PM    Patient Name: Cindy Thornton      Medical Record Number: [id number]  Date of Birth: 1951-05-08 Sex: Female         Room/Bed: 4W19C/4W19C-01 Payor Info: Payor: /    Admitting Diagnosis: CVA  Admit Date/Time:  09/06/2017  3:56 PM Admission Comments: No comment available   Primary Diagnosis:  <principal problem not specified> Principal Problem: <principal problem not specified>  Patient Active Problem List   Diagnosis Date Noted  . Hypokalemia   . Labile blood pressure   . Essential hypertension   . Diabetes mellitus type 2 in nonobese (HCC)   . Right hemiparesis (HCC)   . 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 ICH (intracerebral hemorrhage) (HCC) 09/06/2017  . Leukocytosis   . Dysphagia, post-stroke   . Benign essential HTN   . Diabetes mellitus type 2 in obese (HCC)   . 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 Maryla MorrowAnkit Patel Social Worker Present: Amada JupiterLucy Kenyen Candy, LCSW Nurse Present: Willey BladeKaren Winter, RN PT Present: Judieth KeensKaren Donaworth, PT OT Present: Roney MansJennifer Smith, OT SLP Present: Jackalyn LombardNicole Page, SLP PPS Coordinator present : Tora DuckMarie Noel, RN, CRRN     Current Status/Progress Goal Weekly Team Focus  Medical   Functional, cognitive and mobility deficits  secondary to hemorrhagic left basal ganglia infarct  Improve mobility, BP, dysphagia, hypokalemia  See above   Bowel/Bladder   continenet of bowel & bladder, LBM 09/23/17  continue continence  monitor function   Swallow/Nutrition/ Hydration   Dys 3, thin liquids; tolerating with mod I, advanced to intermittent supervision during meals    mod I   trials of regular textures    ADL's   Mod A LB dressing/ bathing, min A UB bathing/dressing; mod-max A  squat pivot transfers  MinA overall      Mobility   mod A transfers, gait with max A  downgraded to mod A  family ed   Communication   min-mod assist for intelligibility and functional communication, improved breath support for speech can complete EMST with min-mod assist x10  min assist   higher level naming, intelligibility, EMST   Safety/Cognition/ Behavioral Observations  min assist   supervision (upgraded)   higher level problem solving    Pain   no c/o pain, has tylenol prn  pain scale <3  continue to assess & treat as needed   Skin   small amount of bruising to abdomen, wrists, posterior thighs  no new areas of skin breakdown while on rehab  assess skin q shift    Rehab Goals Patient on target to meet rehab goals: Yes *See Care Plan and progress notes for long and short-term goals.     Barriers to Discharge  Current Status/Progress Possible Resolutions Date Resolved   Physician    Medical stability     See above  Therapies, optimize HTN/DM meds, advance diet as tolerated, follow labs      Nursing                  PT                    OT                  SLP  SW                Discharge Planning/Teaching Needs:  Pt to d/c to daughter's home [facility]) and she will provide 24/7 assistance.  Teaching to be planned closer to d/c.   Team Discussion:  Cont b/b;  BSs are under control.  Making good progress overall this week.  Much improved with cogniton and language and anticipate upgrade to a regular diet by d/c.  Plan for supervision w/c level in the home.  Min/ mod assist overall with min assist goals.  Ready to begin family ed.  Revisions to Treatment Plan:  None    Continued Need for Acute Rehabilitation Level of Care: The patient requires daily medical management by a physician with specialized training in physical medicine and rehabilitation for the following conditions: Daily direction of a multidisciplinary physical rehabilitation program to  ensure safe treatment while eliciting the highest outcome that is of practical value to the patient.: Yes Daily medical management of patient stability for increased activity during participation in an intensive rehabilitation regime.: Yes Daily analysis of laboratory values and/or radiology reports with any subsequent need for medication adjustment of medical intervention for : Neurological problems;Diabetes problems;Blood pressure problems;Other  Cindy Thornton 09/25/2017, 1:59 PM

## 2017-09-24 NOTE — Progress Notes (Addendum)
Physical Therapy Weekly Progress Note  Patient Details  Name: Cindy Thornton MRN: [id number] Date of Birth: [birth year]  Beginning of progress report period: September 15, 2017 End of progress report period: September 24, 2017  Patient has met 3 of 3 short term goals.  Pt improving w/c mobility and transfers, has knee brace to assist with standing and walking.  Patient continues to demonstrate the following deficits muscle weakness, decreased awareness, decreased problem solving and delayed processing and decreased standing balance, decreased postural control, hemiplegia and decreased balance strategies and therefore will continue to benefit from skilled PT intervention to increase functional independence with mobility.  Patient not progressing toward long term goals.  See goal revision..  Continue plan of care. gait goal for PT only, no home ambulation.  PT Short Term Goals Week 2:  PT Short Term Goal 1 (Week 2): pt will perform gait with LRAD and assistance of 1 person, x 25' PT Short Term Goal 1 - Progress (Week 2): Met PT Short Term Goal 2 (Week 2): pt will propel w/c x 150' with supervision PT Short Term Goal 2 - Progress (Week 2): Met PT Short Term Goal 3 (Week 2): pt will perform bed>< w/c transfers with mod assist PT Short Term Goal 3 - Progress (Week 2): Met  Skilled Therapeutic Interventions/Progress Updates:  Ambulation/gait training;Discharge planning;Functional mobility training;Therapeutic Activities;Psychosocial support;Visual/perceptual remediation/compensation;Balance/vestibular training;Disease management/prevention;Neuromuscular re-education;Therapeutic Exercise;Wheelchair propulsion/positioning;Cognitive remediation/compensation;DME/adaptive equipment instruction;Pain management;UE/LE Strength taining/ROM;Splinting/orthotics;Stair training;UE/LE Coordination activities;Patient/family education;Functional electrical stimulation;Community reintegration      See Function  Navigator for Current Functional Status.   [name] 09/24/2017, 9:55 AM

## 2017-09-25 ENCOUNTER — Inpatient Hospital Stay (HOSPITAL_COMMUNITY): Payer: Self-pay | Admitting: Occupational Therapy

## 2017-09-25 ENCOUNTER — Inpatient Hospital Stay (HOSPITAL_COMMUNITY): Payer: Self-pay | Admitting: Speech Pathology

## 2017-09-25 ENCOUNTER — Inpatient Hospital Stay (HOSPITAL_COMMUNITY): Payer: Self-pay

## 2017-09-25 DIAGNOSIS — I611 Nontraumatic intracerebral hemorrhage in hemisphere, cortical: Secondary | ICD-10-CM

## 2017-09-25 LAB — GLUCOSE, CAPILLARY
GLUCOSE-CAPILLARY: 98 mg/dL (ref 65–99)
GLUCOSE-CAPILLARY: 117 mg/dL — AB (ref 65–99)
GLUCOSE-CAPILLARY: 108 mg/dL — AB (ref 65–99)
Glucose-Capillary: 138 mg/dL — ABNORMAL HIGH (ref 65–99)

## 2017-09-25 NOTE — Progress Notes (Signed)
[place] PHYSICAL MEDICINE & REHABILITATION     PROGRESS NOTE   Subjective/Complaints: Patient up in bed.  No complaints.  Denies pain or any issues with her breathing.  Had no questions for me today.  ROS: pt denies nausea, vomiting, diarrhea, cough, shortness of breath or chest pain   Objective: Vital Signs: Blood pressure (!) 135/46, pulse 73, temperature 98.1 F (36.7 C), temperature source Oral, resp. rate 16, height 5' (1.524 m), weight 69.6 kg (153 lb 7 oz), SpO2 97 %. No results found. Recent Labs    09/22/17 1124  WBC 8.6  HGB 12.6  HCT 38.5  PLT 170   Recent Labs    09/22/17 1124 09/24/17 0717  NA 137 141  K 3.4* 4.2  CL 106 105  GLUCOSE 201* 108*  BUN 28* 26*  CREATININE 0.93 0.92  CALCIUM 9.9 9.8   CBG (last 3)  Recent Labs    09/24/17 1632 09/24/17 2111 09/25/17 0624  GLUCAP 115* 115* 98    Wt Readings from Last 3 Encounters:  09/06/17 69.6 kg (153 lb 7 oz)  09/02/17 70.8 kg (156 lb 1.4 oz)    Physical Exam:  Constitutional: She appears well-developed and well-nourished. No distress.  HENT: Normocephalic and atraumatic.  Eyes: EOMI. No discharge.  Cardiovascular: RRR without murmur. No JVD   Respiratory: CTA Bilaterally without wheezes or rales. Normal effort .  GI: Bowel sounds are normal. She exhibits no distension.  Musculoskeletal: She exhibits no edema or tenderness.  Neurological: She is alert.  Right facial weakness  Speech dysarthric     Has difficulty following simple motor commands (?language) Motor: Right upper extremity: Shoulder abduction 1+/5, distally 0/5 (stable).   No increase in tone Right lower extremity: 4--4/5 proximal to distal  Skin: Skin is warm and dry. She is not diaphoretic.  Psychiatric: Limited due to language  Assessment/Plan: 1.  Functional deficits in right hemiparesis secondary to left basal ganglia infarct which require 3+ hours per day of interdisciplinary therapy in a comprehensive inpatient rehab  setting. Physiatrist is providing close team supervision and 24 hour management of active medical problems listed below. Physiatrist and rehab team continue to assess barriers to discharge/monitor patient progress toward functional and medical goals.  Function:  Bathing Bathing position   Position: Shower  Bathing parts Body parts bathed by patient: Chest, Abdomen, Front perineal area, Right upper leg, Left upper leg, Right arm, Buttocks, Right lower leg, Left lower leg Body parts bathed by helper: Left arm, Back  Bathing assist Assist Level: Touching or steadying assistance(Pt > 75%)      Upper Body Dressing/Undressing Upper body dressing   What is the patient wearing?: Pull over shirt/dress   Bra - Perfomed by helper: Thread/unthread right bra strap, Thread/unthread left bra strap, Hook/unhook bra (pull down sports bra) Pull over shirt/dress - Perfomed by patient: Thread/unthread left sleeve, Put head through opening, Thread/unthread right sleeve, Pull shirt over trunk Pull over shirt/dress - Perfomed by helper: Pull shirt over trunk        Upper body assist Assist Level: Touching or steadying assistance(Pt > 75%)      Lower Body Dressing/Undressing Lower body dressing   What is the patient wearing?: Pants, Socks     Pants- Performed by patient: Thread/unthread right pants leg, Thread/unthread left pants leg Pants- Performed by helper: Pull pants up/down   Non-skid slipper socks- Performed by helper: Don/doff right sock, Don/doff left sock Socks - Performed by patient: Don/doff right sock, Don/doff left  sock Socks - Performed by helper: Don/doff right sock, Don/doff left sock Shoes - Performed by patient: Don/doff left shoe, Don/doff right shoe Shoes - Performed by helper: Fasten right       TED Hose - Performed by helper: Don/doff right TED hose, Don/doff left TED hose  Lower body assist Assist for lower body dressing: Touching or steadying assistance (Pt > 75%)       Toileting Toileting Toileting activity did not occur: No continent bowel/bladder event Toileting steps completed by patient: Adjust clothing prior to toileting, Performs perineal hygiene Toileting steps completed by helper: Adjust clothing prior to toileting, Performs perineal hygiene, Adjust clothing after toileting Toileting Assistive Devices: Grab bar or rail  Toileting assist Assist level: Two helpers   Transfers Chair/bed transfer   Chair/bed transfer method: Stand pivot Chair/bed transfer assist level: Moderate assist (Pt 50 - 74%/lift or lower) Chair/bed transfer assistive device: Armrests     Locomotion Ambulation     Max distance: 20 Assist level: Moderate assist (Pt 50 - 74%)   Wheelchair Wheelchair activity did not occur: (unable secondary to height) Type: Manual Max wheelchair distance: 150 Assist Level: Supervision or verbal cues  Cognition Comprehension Comprehension assist level: Understands basic 50 - 74% of the time/ requires cueing 25 - 49% of the time  Expression Expression assist level: Expresses basic 50 - 74% of the time/requires cueing 25 - 49% of the time. Needs to repeat parts of sentences.  Social Interaction Social Interaction assist level: Interacts appropriately 50 - 74% of the time - May be physically or verbally inappropriate.  Problem Solving Problem solving assist level: Solves basic 50 - 74% of the time/requires cueing 25 - 49% of the time  Memory Memory assist level: Recognizes or recalls 50 - 74% of the time/requires cueing 25 - 49% of the time   Medical Problem List and Plan: 1.  Functional, cognitive and mobility deficits  secondary to hemorrhagic left basal ganglia infarct    Cont CIR.    WHO, ROM 2.  DVT Prophylaxis/Anticoagulation: Mechanical: Sequential compression devices, below knee Bilateral lower extremities   Dopplers neg for DVT 3. Pain Management: N/A 4. Mood: LCSW to follow for evaluation and support as mentation improves.  5.  Neuropsych: This patient is not capable of making decisions on her own behalf. 6. Skin/Wound Care: routine pressure relief measures.  7. Fluids/Electrolytes/Nutrition: Encourage oral intake. 8. HTN: Monitor BP qid.    Lisinopril increased to 20 mg BID on 12/19   HCTZ DC'd and chlorthalidone started on 12/23   Cont metoprolol.    Monitor renal status while on nectars.   Blood pressure controlled on 1/3 9. Leukocytosis: Resolved 10 Dysphagia:    Advanced to D3 thin, cont to advance as tolerated 11. Dyslipidemia: On Lipitor  12. T2DM: Hgb A1C-7.1.  Monitor BS ac/hs. Resume metformin.  CBG (last 3)  Recent Labs    09/24/17 1632 09/24/17 2111 09/25/17 0624  GLUCAP 115* 115* 98    Relatively controlled on 1/3 13. Hypokalemia   K+ 4.2 1/2   Cont to monitor  14. Obesity   Body mass index is 29.97 kg/m.  Diet and exercise education  Encourage weight loss to increase endurance and promote overall health   LOS (Days) 19 A FACE TO FACE EVALUATION WAS PERFORMED  Ranelle Oyster, MD 09/25/2017 9:14 AM

## 2017-09-25 NOTE — Progress Notes (Signed)
Social Work Patient ID: Cindy Thornton, female   DOB: Jan 27, 1951, 67 y.o.   MRN: [id number]   Have spoken with patient's granddaughter, Cindy Thornton, about need to begin family education.  She did reach out to the patient's daughter, Cindy Thornton, whom patient will live with at discharge.  Unfortunately, daughter cannot attend therapy sessions this week as she is to complete this week of work.  She will be able to complete family education on Monday which will also be grad day for patient.  Therapies are aware.  Still to order needed DME and home health follow-up.  Continue to follow.  Angelena Sand, LCSW

## 2017-09-25 NOTE — Progress Notes (Signed)
Occupational Therapy Session Note  Patient Details  Name: Cindy Thornton MRN: [id number] Date of Birth: 15-May-1951  Today's Date: 09/25/2017 OT Individual Time: 1005-1100 OT Individual Time Calculation (min): 55 min    Short Term Goals: Week 1:  OT Short Term Goal 1 (Week 1): Pt will complete UB dressing with Min A OT Short Term Goal 1 - Progress (Week 1): Progressing toward goal OT Short Term Goal 2 (Week 1): Pt will complete 1/3 components of donning pants OT Short Term Goal 2 - Progress (Week 1): Met OT Short Term Goal 3 (Week 1): Pt will complete toileting tasks with 1 helper OT Short Term Goal 3 - Progress (Week 1): Progressing toward goal Week 2:  OT Short Term Goal 1 (Week 2): Pt will be able to stand at toilet with mod A to support balance to allow her to cleanse and manage clothing. OT Short Term Goal 1 - Progress (Week 2): Met OT Short Term Goal 2 (Week 2): Pt will be able to don shirt with min A. OT Short Term Goal 2 - Progress (Week 2): Met OT Short Term Goal 3 (Week 2): Pt will be able to transfer on and off toilet with mod A of 1. OT Short Term Goal 3 - Progress (Week 2): Met Week 3:  OT Short Term Goal 1 (Week 3): STG=LTG due to LOS  Skilled Therapeutic Interventions/Progress Updates:    Pt seen this session with interpretor (no family present) for ADL training with tub bench transfers and RUE NMR with standing stability training.  Pt stated she showered last night and was already dressed.   Pt's daughter will need to give therapy team more information about her home bathroom set up, but with the hopes that her w/c will fit into the bathroom pt taken to both tub room and ADL room to practice tub bench transfers in both directions.   Pt used hemiwalker for support for a stand pivot transfer to both her L and R sides with min A.  Good RLE knee stability and placement today.  The space of the tub allowed the therapist to stay in close proximity to pt's R side for support. Once  her hips were at end of bench, she was able to continue scooting onto bench with steadying A and min A to lift R leg over tub wall.   In gym, pt given a long handled sponge and pt practiced using it in a simulated manner to wash her feet and upper L arm.   Pt can use the sponge for her next shower.  She worked on hand over Tree surgeon of RUE with hand on handle of hemiwalker to push walker back and forth with active use of her shoulder.  She then worked on grasping and releasing cones with support through wrist from therapist to achieve a grasp (pt has no active wrist and finger movement) from seated 10x 2 sets and from standing 10x 2 sets. In standing pt stood at hi-low table with L hand on table and therapist providing support through R knee.  Tactile cues to engage quads for knee extension.  Pt's standing tolerance was 2- 3 minutes before she needed to rest.  Worked on static standing without L hand support with min - mod Aso she could use hand for clothing management with dressing. Pt taken back to room. Quick release belt on and all needs met.   Therapy Documentation Precautions:  Precautions Precautions: Fall Precaution Comments: Rt hemi, Rt  inattention, interpretor needs (Spanish)  Restrictions Weight Bearing Restrictions: No   Pain: Pain Assessment Pain Assessment: No/denies pain ADL: ADL ADL Comments: Please see functional navigator for ADL status  See Function Navigator for Current Functional Status.   Therapy/Group: Individual Therapy  Manual Navarra 09/25/2017, 12:29 PM

## 2017-09-25 NOTE — Progress Notes (Signed)
Physical Therapy Note  Patient Details  Name: Cindy Thornton MRN: [id number] Date of Birth: [birth year] Today's Date: 09/25/2017  1300-1415, 75 min individual tx Pain: none per pt  [name] here as interpreter. [name] stated that pt is awaiting pediatric r knee brace.   Pt stated she needed to use toilet.  PT noted that pt's R foot was rolled medially, lying on foot rest of w/c.  Pt educated pt on need to keep sole of foot on foot plate and use air cast to protect ankle.  PT donned air cast.  Toilet transfer for continent voiding, but panties and pants noted to be slightly wet.  Pt stated that this happened on the way to the toilet.  PT assisted pt in changing panties and pants while she sat on toilet.  W/c propulsion on level tile with supervision, cue for route finding.  Neuromuscular re-education via forced use for alternating reciprocal LEs movement on Kinetron from w/c at level 60 20 cycles x 4.  Sit> stand in // for gait x 5' forward and backward, without R knee brace.  Gait training on level tile with HW x 15' x 2, with pt's RUE draped across PT's shoulder for trunk and RUE support.  Mod/max assist needed for wt shifting, placement of R foot as it tends to adduct during swing phase.  Shoe cover placed over R shoe for gait x 15', with improved clearance of R foot.  Standing activity to fold 4 wash cloths with L hand, using R hand as gross assist ; with distraction of UE activity, pt's R knee buckles due to lack of awareness, requiring max assist to extend.  Pt left resting in w/c with quick release belt applied and all needs within reach.  [name], CSW stated that pt's dtr [name] will be here for family ed on Monday 1/ 7.    [name] 09/25/2017, 8:21 AM

## 2017-09-25 NOTE — Progress Notes (Signed)
Speech Language Pathology Daily Session Note  Patient Details  Name: Cindy Thornton MRN: [id number] Date of Birth: 12/06/1950  Today's Date: 09/25/2017 SLP Individual Time: 0909-1005 SLP Individual Time Calculation (min): 56 min  Short Term Goals: Week 3: SLP Short Term Goal 1 (Week 3): STG=LTG due to ELOS  Skilled Therapeutic Interventions:  Pt was seen for skilled ST targeting goals for communication and dysphagia.  Pt consumed thin liquids via cup sips with mod I use of swallowing precautions and no overt s/s of aspiration.  Pt returned demonstration of how to use EMST and incentive spirometer with supervision cues for 15 and 40 repetitions respectively.  Pt has improved vocal intensity for speech today in comparison to previous therapy sessions during both structured and unstructured speech tasks with min verbal cues only needed to achieve intelligibility.  Pt also benefited from min verbal cues for word finding during structured picture description tasks.  Pt was returned to room and left in wheelchair with quick release belt donned.  Pt is making excellent gains.  Continue per current plan of care.    Function:  Eating Eating   Modified Consistency Diet: Yes Eating Assist Level: More than reasonable amount of time   Eating Set Up Assist For: Opening containers       Cognition Comprehension Comprehension assist level: Understands basic 90% of the time/cues < 10% of the time  Expression Expression assistive device: Other (Comment)(interpreter) Expression assist level: Expresses basic 75 - 89% of the time/requires cueing 10 - 24% of the time. Needs helper to occlude trach/needs to repeat words.  Social Interaction Social Interaction assist level: Interacts appropriately 90% of the time - Needs monitoring or encouragement for participation or interaction.  Problem Solving Problem solving assist level: Solves basic 75 - 89% of the time/requires cueing 10 - 24% of the time  Memory  Memory assist level: Recognizes or recalls 75 - 89% of the time/requires cueing 10 - 24% of the time    Pain Pain Assessment Pain Assessment: No/denies pain  Therapy/Group: Individual Therapy  Annasophia Crocker, [name] 09/25/2017, 10:03 AM

## 2017-09-26 ENCOUNTER — Inpatient Hospital Stay (HOSPITAL_COMMUNITY): Payer: Self-pay | Admitting: Physical Therapy

## 2017-09-26 ENCOUNTER — Inpatient Hospital Stay (HOSPITAL_COMMUNITY): Payer: Self-pay | Admitting: Occupational Therapy

## 2017-09-26 ENCOUNTER — Ambulatory Visit (HOSPITAL_COMMUNITY): Payer: Self-pay | Admitting: Speech Pathology

## 2017-09-26 LAB — GLUCOSE, CAPILLARY
GLUCOSE-CAPILLARY: 100 mg/dL — AB (ref 65–99)
GLUCOSE-CAPILLARY: 90 mg/dL (ref 65–99)
Glucose-Capillary: 113 mg/dL — ABNORMAL HIGH (ref 65–99)
Glucose-Capillary: 148 mg/dL — ABNORMAL HIGH (ref 65–99)

## 2017-09-26 NOTE — Progress Notes (Signed)
Occupational Therapy Session Note  Patient Details  Name: Cindy Thornton MRN: [id number] Date of Birth: [birth year]  Today's Date: 09/26/2017 OT Individual Time: 1000-1100 OT Individual Time Calculation (min): 60 min    Short Term Goals: Week 2:  OT Short Term Goal 1 (Week 2): Pt will be able to stand at toilet with mod A to support balance to allow her to cleanse and manage clothing. OT Short Term Goal 1 - Progress (Week 2): Met OT Short Term Goal 2 (Week 2): Pt will be able to don shirt with min A. OT Short Term Goal 2 - Progress (Week 2): Met OT Short Term Goal 3 (Week 2): Pt will be able to transfer on and off toilet with mod A of 1. OT Short Term Goal 3 - Progress (Week 2): Met Week 3:  OT Short Term Goal 1 (Week 3): STG=LTG due to LOS  Skilled Therapeutic Interventions/Progress Updates:    Pt seen this session for ADL training with a focus on stand pivot transfers to tub bench.  Bench place at an angle where she could use her L hand to hold bar. Pt was able to stand pivot with only min A to and from edge of bench.  Excellent job of scooting over on bench demonstrating strong trunk control.  She used a long handled sponge to wash L arm using sponge in L hand and her feet. Lateral leans on bench to wash bottom.  Pt worked on actively flexing elbow to lift arm with cues.  Pt continues to demonstrate R inattention, but with cues she actively flexed her fingers.  Pt returned to w/c to dress.  Donned shirt and pants with min A. Pt actively worked on Land over R arm and Underwear over R foot and hips but needed min A to fully complete.  To pull pants over hips, she pushed up to stand, braced self on hemiwalker and then released hand to pull pants over hips 70% of the way with mod A to support dynamic balance.   Pt completed self care and then worked on A/arom of RUE with table top towel slides.  Improved sh flexion and elbow flexion.  Developing finger flexion. Provided pt with soft foam  block to squeeze. Pt in chair with quick release belt on and all needs met.     Therapy Documentation Precautions:  Precautions Precautions: Fall Precaution Comments: Rt hemi, Rt inattention, interpretor needs (Spanish)  Restrictions Weight Bearing Restrictions: No       Pain: Pain Assessment Pain Assessment: No/denies pain ADL: ADL ADL Comments: Please see functional navigator for ADL status  See Function Navigator for Current Functional Status.   Therapy/Group: Individual Therapy  [place] 09/26/2017, 12:18 PM

## 2017-09-26 NOTE — Progress Notes (Signed)
[place] PHYSICAL MEDICINE & REHABILITATION     PROGRESS NOTE   Subjective/Complaints: Patient sitting in her chair.  No complaints today.  Feels fairly well  ROS: pt denies nausea, vomiting, diarrhea, cough, shortness of breath or chest pain    Objective: Vital Signs: Blood pressure (!) 148/49, pulse 70, temperature 98.3 F (36.8 C), temperature source Oral, resp. rate 20, height 5' (1.524 m), weight 69.6 kg (153 lb 7 oz), SpO2 96 %. No results found. No results for input(s): WBC, HGB, HCT, PLT in the last 72 hours. Recent Labs    09/24/17 0717  NA 141  K 4.2  CL 105  GLUCOSE 108*  BUN 26*  CREATININE 0.92  CALCIUM 9.8   CBG (last 3)  Recent Labs    09/25/17 1639 09/25/17 2120 09/26/17 0620  GLUCAP 138* 108* 100*    Wt Readings from Last 3 Encounters:  09/06/17 69.6 kg (153 lb 7 oz)  09/02/17 70.8 kg (156 lb 1.4 oz)    Physical Exam:  Constitutional: She appears well-developed and well-nourished. No distress.  HENT: Normocephalic and atraumatic.  Eyes: EOMI. No discharge.  Cardiovascular: RRR without murmur. No JVD    Respiratory: CTA Bilaterally without wheezes or rales. Normal effort  Musculoskeletal: She exhibits no edema or tenderness.  Neurological: She is alert.  Right facial weakness  Speech dysarthric     Motor: Right upper extremity: Shoulder abduction 1+/5, distally 0/5 (stable).   No increase in tone Right lower extremity: 4--4/5 proximal to distal  Skin: Skin is warm and dry. She is not diaphoretic.  Psychiatric: Bright and engaging  Assessment/Plan: 1.  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 Physiatrist is providing close team supervision and 24 hour management of active medical problems listed below. Physiatrist and rehab team continue to assess barriers to discharge/monitor patient progress toward  functional and medical goals.  Function:  Bathing Bathing position   Position: Shower  Bathing parts Body parts bathed by patient: Chest, Abdomen, Front perineal area, Right upper leg, Left upper leg, Right arm, Buttocks, Right lower leg, Left lower leg Body parts bathed by helper: Left arm, Back  Bathing assist Assist Level: Touching or steadying assistance(Pt > 75%)      Upper Body Dressing/Undressing Upper body dressing   What is the patient wearing?: Pull over shirt/dress   Bra - Perfomed by helper: Thread/unthread right bra strap, Thread/unthread left bra strap, Hook/unhook bra (pull down sports bra) Pull over shirt/dress - Perfomed by patient: Thread/unthread left sleeve, Put head through opening, Thread/unthread right sleeve, Pull shirt over trunk Pull over shirt/dress - Perfomed by helper: Pull shirt over trunk        Upper body assist Assist Level: Touching or steadying assistance(Pt > 75%)      Lower Body Dressing/Undressing Lower body dressing   What is the patient wearing?: Pants, Socks     Pants- Performed by patient: Thread/unthread right pants leg, Thread/unthread left pants leg Pants- Performed by helper: Pull pants up/down   Non-skid slipper socks- Performed by helper: Don/doff right sock, Don/doff left sock Socks - Performed by patient: Don/doff right sock, Don/doff left sock Socks - Performed by helper: Don/doff right sock, Don/doff left sock Shoes - Performed by patient: Don/doff left shoe, Don/doff right shoe Shoes - Performed by helper: Fasten right       TED Hose - Performed by helper: Don/doff right  TED hose, Don/doff left TED hose  Lower body assist Assist for lower body dressing: Touching or steadying assistance (Pt > 75%)      Toileting Toileting Toileting activity did not occur: No continent bowel/bladder event Toileting steps completed by patient: Performs perineal hygiene Toileting steps completed by helper: Adjust clothing prior to  toileting, Adjust clothing after toileting Toileting Assistive Devices: Grab bar or rail  Toileting assist Assist level: Two helpers   Transfers Chair/bed transfer   Chair/bed transfer method: Stand pivot Chair/bed transfer assist level: Moderate assist (Pt 50 - 74%/lift or lower) Chair/bed transfer assistive device: Mechanical lift Mechanical lift: Landscape architect     Max distance: 15 Assist level: Maximal assist (Pt 25 - 49%)   Wheelchair Wheelchair activity did not occur: (unable secondary to height) Type: Manual Max wheelchair distance: 150 Assist Level: Supervision or verbal cues  Cognition Comprehension Comprehension assist level: Understands basic 90% of the time/cues < 10% of the time  Expression Expression assist level: Expresses basic 75 - 89% of the time/requires cueing 10 - 24% of the time. Needs helper to occlude trach/needs to repeat words.  Social Interaction Social Interaction assist level: Interacts appropriately 90% of the time - Needs monitoring or encouragement for participation or interaction.  Problem Solving Problem solving assist level: Solves basic 75 - 89% of the time/requires cueing 10 - 24% of the time  Memory Memory assist level: Recognizes or recalls 75 - 89% of the time/requires cueing 10 - 24% of the time   Medical Problem List and Plan: 1.  Functional, cognitive and mobility deficits  secondary to hemorrhagic left basal ganglia infarct    Cont CIR.    WHO, ROM, family education and process 2.  DVT Prophylaxis/Anticoagulation: Mechanical: Sequential compression devices, below knee Bilateral lower extremities   Dopplers neg for DVT 3. Pain Management: N/A 4. Mood: LCSW to follow for evaluation and support as mentation improves.  5. Neuropsych: This patient is not capable of making decisions on her own behalf. 6. Skin/Wound Care: routine pressure relief measures.  7. Fluids/Electrolytes/Nutrition: Encourage oral intake. 8. HTN:  Monitor BP qid.    Lisinopril increased to 20 mg BID on 12/19   HCTZ DC'd and chlorthalidone started on 12/23   Cont metoprolol.    Monitor renal status while on nectars--recheck electrolytes on Monday   Blood pressure controlled on 1/4 9. Leukocytosis: Resolved 10 Dysphagia:    Advanced to D3 thin, cont to advance as tolerated 11. Dyslipidemia: On Lipitor  12. T2DM: Hgb A1C-7.1.  Monitor BS ac/hs. Resume metformin.  CBG (last 3)  Recent Labs    09/25/17 1639 09/25/17 2120 09/26/17 0620  GLUCAP 138* 108* 100*    Relatively controlled on 1/3 13. Hypokalemia   K+ 4.2 1/2   Cont to monitor  14. Obesity   Body mass index is 29.97 kg/m.  Diet and exercise education  Encourage weight loss to increase endurance and promote overall health   LOS (Days) 20 A FACE TO FACE EVALUATION WAS PERFORMED  Ranelle Oyster, MD 09/26/2017 9:47 AM

## 2017-09-26 NOTE — Progress Notes (Signed)
Speech Language Pathology Daily Session Note  Patient Details  Name: Cindy Thornton MRN: [id number] Date of Birth: 27-Feb-1951  Today's Date: 09/26/2017 SLP Individual Time: [id number] SLP Individual Time Calculation (min): 40 min  Short Term Goals: Week 3: SLP Short Term Goal 1 (Week 3): STG=LTG due to ELOS  Skilled Therapeutic Interventions: Pt was seen for skilled ST targeting goals for communication and dysphagia.  Pt was on the phone with her son upon therapist's arrival with noted strong, clear voicing.  Overall, pt's initiation of verbal expression and her ability to convey her needs and wants to caregivers is significantly improved from initial evaluation as evidenced by needing only min assist-supervision verbal cues.  Pt consumed a trial meal tray of regular textures and thin liquids to continue working towards diet progression.  Pt consumed advanced solids with mod I use of swallowing precautions.  Pt demonstrated no overt s/s of aspiration with solids or liquids, even when consumed via straw.  As a result, recommend upgrading pt to a regular diet and discharging no straws recommendation.  Pt returned demonstration of incentive spirometer for x40 repetitions with mod I.  She completed x5 repetitions of EMST with mod I but then reported mild dizziness; therapist reviewed and reinforced parameters for safe device use, including s/s indicating need for cessation.  Pt verbalized understanding.  Pt was left in wheelchair with call bell within reach.  Continue per current plan of care.       Function:  Eating Eating   Modified Consistency Diet: Yes Eating Assist Level: More than reasonable amount of time           Cognition Comprehension Comprehension assist level: Follows basic conversation/direction with no assist  Expression Expression assistive device: Other (Comment)(interpreter) Expression assist level: Expresses basic 75 - 89% of the time/requires cueing 10 - 24% of the time.  Needs helper to occlude trach/needs to repeat words.  Social Interaction Social Interaction assist level: Interacts appropriately with others with medication or extra time (anti-anxiety, antidepressant).  Problem Solving Problem solving assist level: Solves basic 90% of the time/requires cueing < 10% of the time  Memory Memory assist level: Recognizes or recalls 75 - 89% of the time/requires cueing 10 - 24% of the time    Pain Pain Assessment Pain Assessment: No/denies pain  Therapy/Group: Individual Therapy  Adia Crammer, [name] 09/26/2017, 2:51 PM

## 2017-09-26 NOTE — Progress Notes (Signed)
Physical Therapy Note  Patient Details  Name: [name] MRN: [id number] Date of Birth: 1951-07-23 Today's Date: 09/26/2017    Time: 900-954 54 minutes  1:1 No c/o pain.  Pt reports need to use restroom.  Pt propels w/c to bathroom with supervision, min A to go up incline.  Toilet transfers with min A.  Min A to remain standing, max A for clothing management.  Pt able to perform hygiene with supervision seated on toilet.  W/c mobility throughout unit with supervision.  Gait with hemi walker 35' x 2 with pt's Rt UE over PT's shoulder, min/mod A for wt shifts and Rt LE placement.  Car transfer to simulated sedan x 2 attempts with min/mod A.  Couch and recliner transfers with min A, cues for safety and UE and LE placement.  pt states she is excited for d/c next week.   DONAWERTH,KAREN 09/26/2017, 9:55 AM

## 2017-09-26 NOTE — Progress Notes (Signed)
Physical Therapy Session Note  Patient Details  Name: Graciela Plato MRN: [id number] Date of Birth: 02/03/1951  Today's Date: 09/26/2017 PT Individual Time: 1400-1430 PT Individual Time Calculation (min): 30 min   Short Term Goals: Week 3:     Skilled Therapeutic Interventions/Progress Updates:    no c/o pain.  Session focus on LE strengthening and NMR.  Pt taken to therapy gym for time management.  Transfer to therapy mat with hemiwalker, min assist.  Bridging 2x15, hip adduction 2x15, and hip abduction 2x15.  Pt requiring increased cues to switch exercises.  Trunk rotation stretch to R and L 2x30 seconds each.  Pt returned to room at end of session and positioned upright in w/c with call bell in reach and needs met.   Therapy Documentation Precautions:  Precautions Precautions: Fall Precaution Comments: Rt hemi, Rt inattention, interpretor needs (Spanish)  Restrictions Weight Bearing Restrictions: No   See Function Navigator for Current Functional Status.   Therapy/Group: Individual Therapy  Michel Santee 09/26/2017, 4:54 PM

## 2017-09-27 ENCOUNTER — Inpatient Hospital Stay (HOSPITAL_COMMUNITY): Payer: Self-pay | Admitting: Occupational Therapy

## 2017-09-27 ENCOUNTER — Inpatient Hospital Stay (HOSPITAL_COMMUNITY): Payer: Self-pay | Admitting: Physical Therapy

## 2017-09-27 DIAGNOSIS — I610 Nontraumatic intracerebral hemorrhage in hemisphere, subcortical: Secondary | ICD-10-CM

## 2017-09-27 LAB — GLUCOSE, CAPILLARY
GLUCOSE-CAPILLARY: 85 mg/dL (ref 65–99)
GLUCOSE-CAPILLARY: 170 mg/dL — AB (ref 65–99)
GLUCOSE-CAPILLARY: 95 mg/dL (ref 65–99)
GLUCOSE-CAPILLARY: 100 mg/dL — AB (ref 65–99)

## 2017-09-27 NOTE — Progress Notes (Signed)
Physical Therapy Session Note  Patient Details  Name: Cindy Thornton MRN: [id number] Date of Birth: 02-26-1951  Today's Date: 09/27/2017 PT Individual Time: 1610-1705 PT Individual Time Calculation (min): 55 min   Short Term Goals: WWeek 2:  PT Short Term Goal 1 (Week 2): pt will perform gait with LRAD and assistance of 1 person, x 25' PT Short Term Goal 1 - Progress (Week 2): Met PT Short Term Goal 2 (Week 2): pt will propel w/c x 150' with supervision PT Short Term Goal 2 - Progress (Week 2): Met PT Short Term Goal 3 (Week 2): pt will perform bed>< w/c transfers with mod assist PT Short Term Goal 3 - Progress (Week 2): Met Week 3:      Skilled Therapeutic Interventions/Progress Updates:   Pt received sitting in WC and agreeable to PT. PT transported pt to rehab gym in [facility] for time management. Stand pivot transfer to Nustep with min assist. Nustep reciprocal movement training x 8 minutes level 2>3.  Gait training with HW x 64f with mod assist from PT to improve gait pattern and weight shift to allow RLE advancement.   Stand pivot transfer to mat table with min assist. Sit>supine with min assist to control the RLE. PT instructed pt in NMR.  SAQ x 12 Hip abduction x12  Clam shells with manual resistance. x12 Isometric hip abduction to sqeeze small therapy ball. x15 Heel slides. x12 Ankle pumps, AAROM for DF with moderate tactile cues for activation of Tibialis Anterior x20 Bridges. 2x10 moderate cues to maintain neutral hip positioning and prevent compensatory hip IR/adduction.   Supine>sit with min assist. [facility] and all needs met.          Therapy Documentation Precautions:  Precautions Precautions: Fall Precaution Comments: Rt hemi, Rt inattention, interpretor needs (Spanish)  Restrictions Weight Bearing Restrictions: No General:   Vital Signs: Therapy  Vitals Temp: 98.5 F (36.9 C) Temp Source: Oral Pulse Rate: 78 Resp: (!) 50 BP: (!) 145/43 Patient Position (if appropriate): Lying Oxygen Therapy SpO2: 98 % O2 Device: Not Delivered Pain:   Mobility:   Locomotion :    Trunk/Postural Assessment :    Balance:   Exercises:   Other Treatments:     See Function Navigator for Current Functional Status.   Therapy/Group: Individual Therapy  ALorie Phenix1/01/2018, 5:18 PM

## 2017-09-27 NOTE — Progress Notes (Addendum)
Occupational Therapy Session Note  Patient Details  Name: Cindy Thornton MRN: [id number] Date of Birth: [birth year]  Today's Date: 09/27/2017 OT Individual Time:  - 1100-1200   (60 min)  1st session                                        1300-1415   (75 min)  2nd session       Short Term Goals: Week 1:  OT Short Term Goal 1 (Week 1): Pt will complete UB dressing with Min A OT Short Term Goal 1 - Progress (Week 1): Progressing toward goal OT Short Term Goal 2 (Week 1): Pt will complete 1/3 components of donning pants OT Short Term Goal 2 - Progress (Week 1): Met OT Short Term Goal 3 (Week 1): Pt will complete toileting tasks with 1 helper OT Short Term Goal 3 - Progress (Week 1): Progressing toward goal Week 2:  OT Short Term Goal 1 (Week 2): Pt will be able to stand at toilet with mod A to support balance to allow her to cleanse and manage clothing. OT Short Term Goal 1 - Progress (Week 2): Met OT Short Term Goal 2 (Week 2): Pt will be able to don shirt with min A. OT Short Term Goal 2 - Progress (Week 2): Met OT Short Term Goal 3 (Week 2): Pt will be able to transfer on and off toilet with mod A of 1. OT Short Term Goal 3 - Progress (Week 2): Met  Skilled Therapeutic Interventions/Progress Updates:    1st session:  Pt sitting in wc.  Refused to shower but agreed to perform grooming at the sink.  Performed hair and standing.  Stood  for 2 times with min assist for 4.5 min and 5 min;  Performed NMRE ro right UE with AAROM, and weight bearing in standing.  Left pt in wc with safety belt on and all needs in reach.    2nd session:  Stood at sink and washed hand with min assist for balance and usiing RUE.  Transported to gym and addressed standing balance, sitting balance, and attention to right side.  Pt stood with mod assist to toss horseshoes.  Son, Brooke Bonito came during session..  Addressed transfers from wc to standard chair.  Educated son on techniques with postioning and hand placement.  Son  performed with supervision and minimal cues.  Transported pt to OT kitchen and practiced reaching into cabinets in standing position and placement of items.  Pt stood with counter support and mod assist for RLE quad activation.  Son present during rest of session.   Son thought pt was being discharged on Jan 7, but all chart notes stated Jan 8.    Pt taken back to room and left with son in room.  Placed safety belt on and all devices in reach.    Therapy Documentation Precautions:  Precautions Precautions: Fall Precaution Comments: Rt hemi, Rt inattention, interpretor needs (Spanish)  Restrictions Weight Bearing Restrictions: No General:   Vital Signs:  Pain: Pain Assessment Pain Assessment: No/denies pain ADL: ADL ADL Comments: Please see functional navigator for ADL status V   Perception:  perfomed activities for attention to the right.  Ppt lifted right UE using Left UE for belt placement on wc with no cues by end of session  See Function Navigator for Current Functional Status.   Therapy/Group: Individual Therapy  Lisa Roca 09/27/2017, 10:39 AM

## 2017-09-27 NOTE — Progress Notes (Signed)
[place] PHYSICAL MEDICINE & REHABILITATION     PROGRESS NOTE   Subjective/Complaints:  No issues overnite  ROS: pt denies nausea, vomiting, diarrhea, cough, shortness of breath or chest pain    Objective: Vital Signs: Blood pressure (!) 143/56, pulse 76, temperature 98.3 F (36.8 C), temperature source Oral, resp. rate 20, height 5' (1.524 m), weight 69.6 kg (153 lb 7 oz), SpO2 98 %. No results found. No results for input(s): WBC, HGB, HCT, PLT in the last 72 hours. No results for input(s): NA, K, CL, GLUCOSE, BUN, CREATININE, CALCIUM in the last 72 hours.  Invalid input(s): CO CBG (last 3)  Recent Labs    09/26/17 1635 09/26/17 2110 09/27/17 0635  GLUCAP 113* 148* 85    Wt Readings from Last 3 Encounters:  09/06/17 69.6 kg (153 lb 7 oz)  09/02/17 70.8 kg (156 lb 1.4 oz)    Physical Exam:  Constitutional: She appears well-developed and well-nourished. No distress.  HENT: Normocephalic and atraumatic.  Eyes: EOMI. No discharge.  Cardiovascular: RRR without murmur. No JVD    Respiratory: CTA Bilaterally without wheezes or rales. Normal effort  Musculoskeletal: She exhibits no edema or tenderness.  Neurological: She is alert.  Right facial weakness  Speech dysarthric     Motor: Right upper extremity: Shoulder abduction 1+/5, distally 0/5 (stable).   No increase in tone Right lower extremity: 4--4/5 proximal to distal  Skin: Skin is warm and dry. She is not diaphoretic.  Psychiatric: Bright and engaging  Assessment/Plan: 1.  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 Physiatrist is providing close team supervision and 24 hour management of active medical problems listed below. Physiatrist and rehab team continue to assess barriers to discharge/monitor patient progress toward functional and medical  goals.  Function:  Bathing Bathing position   Position: Shower  Bathing parts Body parts bathed by patient: Chest, Abdomen, Front perineal area, Right upper leg, Left upper leg, Right arm, Buttocks, Right lower leg, Left lower leg, Left arm(uses long handled sponge) Body parts bathed by helper: Left arm, Back  Bathing assist Assist Level: Touching or steadying assistance(Pt > 75%)      Upper Body Dressing/Undressing Upper body dressing   What is the patient wearing?: Pull over shirt/dress   Bra - Perfomed by helper: Thread/unthread right bra strap, Thread/unthread left bra strap, Hook/unhook bra (pull down sports bra) Pull over shirt/dress - Perfomed by patient: Thread/unthread left sleeve, Put head through opening, Pull shirt over trunk Pull over shirt/dress - Perfomed by helper: Thread/unthread right sleeve        Upper body assist Assist Level: Touching or steadying assistance(Pt > 75%)      Lower Body Dressing/Undressing Lower body dressing   What is the patient wearing?: Pants, Socks, Underwear, Shoes Underwear - Performed by patient: Thread/unthread left underwear leg Underwear - Performed by helper: Thread/unthread right underwear leg, Pull underwear up/down Pants- Performed by patient: Thread/unthread right pants leg, Thread/unthread left pants leg Pants- Performed by helper: Pull pants up/down   Non-skid slipper socks- Performed by helper: Don/doff right sock, Don/doff left sock Socks - Performed by patient: Don/doff right sock, Don/doff left sock Socks - Performed by helper: Don/doff right sock, Don/doff left sock Shoes - Performed by patient: Don/doff left shoe Shoes - Performed by helper: Don/doff right shoe, Fasten right, Fasten left       TED Hose - Performed by  helper: Don/doff right TED hose, Don/doff left TED hose  Lower body assist Assist for lower body dressing: Touching or steadying assistance (Pt > 75%)      Toileting Toileting Toileting activity did  not occur: No continent bowel/bladder event Toileting steps completed by patient: Performs perineal hygiene Toileting steps completed by helper: Adjust clothing prior to toileting, Adjust clothing after toileting, Performs perineal hygiene Toileting Assistive Devices: Grab bar or rail  Toileting assist Assist level: Two helpers   Transfers Chair/bed transfer   Chair/bed transfer method: Stand pivot Chair/bed transfer assist level: Moderate assist (Pt 50 - 74%/lift or lower) Chair/bed transfer assistive device: Mechanical lift Mechanical lift: Stedy   Locomotion Ambulation     Max distance: 15 Assist level: Maximal assist (Pt 25 - 49%)   Wheelchair Wheelchair activity did not occur: (unable secondary to height) Type: Manual Max wheelchair distance: 150 Assist Level: Supervision or verbal cues  Cognition Comprehension Comprehension assist level: Follows basic conversation/direction with no assist  Expression Expression assist level: Expresses basic 75 - 89% of the time/requires cueing 10 - 24% of the time. Needs helper to occlude trach/needs to repeat words.  Social Interaction Social Interaction assist level: Interacts appropriately with others with medication or extra time (anti-anxiety, antidepressant).  Problem Solving Problem solving assist level: Solves basic 90% of the time/requires cueing < 10% of the time  Memory Memory assist level: Recognizes or recalls 75 - 89% of the time/requires cueing 10 - 24% of the time   Medical Problem List and Plan: 1.  Functional, cognitive and mobility deficits  secondary to hemorrhagic left basal ganglia infarct    Cont CIR. PT, OT SLP   WHO, ROM, family education and process 2.  DVT Prophylaxis/Anticoagulation: Mechanical: Sequential compression devices, below knee Bilateral lower extremities   Dopplers neg for DVT 3. Pain Management: N/A 4. Mood: LCSW to follow for evaluation and support as mentation improves.  5. Neuropsych: This patient  is not capable of making decisions on her own behalf. 6. Skin/Wound Care: routine pressure relief measures.  7. Fluids/Electrolytes/Nutrition: Encourage oral intake. 8. HTN: Monitor BP qid.    Lisinopril increased to 20 mg BID on 12/19   HCTZ DC'd and chlorthalidone started on 12/23   Cont metoprolol.    Monitor renal status while on nectars--recheck electrolytes on Monday    Vitals:   09/26/17 1523 09/27/17 0523  BP: (!) 139/41 (!) 143/56  Pulse: 75 76  Resp: 20 20  Temp: 98.1 F (36.7 C) 98.3 F (36.8 C)  SpO2: 97% 98%  Controlled 1/5 9. Leukocytosis: Resolved 10 Dysphagia:    Advanced to D3 thin, cont to advance as tolerated 11. Dyslipidemia: On Lipitor  12. T2DM: Hgb A1C-7.1.  Monitor BS ac/hs. Resume metformin.  CBG (last 3)  Recent Labs    09/26/17 1635 09/26/17 2110 09/27/17 0635  GLUCAP 113* 148* 85    Relatively controlled on 1/5 13. Hypokalemia   K+ 4.2 1/2   Cont to monitor  14. Obesity   Body mass index is 29.97 kg/m.  Diet and exercise education  Encourage weight loss to increase endurance and promote overall health   LOS (Days) 21 A FACE TO FACE EVALUATION WAS PERFORMED  Erick ColaceAndrew E Nakeesha Bowler, MD 09/27/2017 8:44 AM

## 2017-09-28 LAB — GLUCOSE, CAPILLARY
GLUCOSE-CAPILLARY: 145 mg/dL — AB (ref 65–99)
Glucose-Capillary: 93 mg/dL (ref 65–99)
Glucose-Capillary: 137 mg/dL — ABNORMAL HIGH (ref 65–99)
Glucose-Capillary: 121 mg/dL — ABNORMAL HIGH (ref 65–99)

## 2017-09-28 NOTE — Plan of Care (Signed)
  Progressing RH BOWEL ELIMINATION RH STG MANAGE BOWEL WITH ASSISTANCE Description STG Manage Bowel with  Mod I Assistance.  09/28/2017 [id number] - Progressing by Martina SinnerMurray, Tamara Monteith A, RN RH STG MANAGE BOWEL W/MEDICATION W/ASSISTANCE Description STG Manage Bowel with Medication with  Mod I Assistance.  09/28/2017 [id number] - Progressing by Martina SinnerMurray, Lela Murfin A, RN RH BLADDER ELIMINATION RH STG MANAGE BLADDER WITH ASSISTANCE Description STG Manage Bladder With Mod I  Assistance  09/28/2017 [id number] - Progressing by Martina SinnerMurray, Maelee Hoot A, RN RH SKIN INTEGRITY RH STG SKIN FREE OF INFECTION/BREAKDOWN Description Free from infection during entire stay at Rehab min  09/28/2017 0619 - Progressing by Martina SinnerMurray, Tynlee Bayle A, RN RH SAFETY RH STG ADHERE TO SAFETY PRECAUTIONS W/ASSISTANCE/DEVICE Description STG Adhere to Safety Precautions With  Min Assistance/Device.   09/28/2017 [id number] - Progressing by Martina SinnerMurray, Nataliee Shurtz A, RN RH PAIN MANAGEMENT RH STG PAIN MANAGED AT OR BELOW PT'S PAIN GOAL Description Zero pain  09/28/2017 0619 - Progressing by Martina SinnerMurray, Livvy Spilman A, RN

## 2017-09-28 NOTE — Progress Notes (Signed)
[facility] PHYSICAL [facility]ICINE & REHABILITATION     PROGRESS NOTE   Subjective/Complaints:  No issues overnite  ROS: pt denies nausea, vomiting, diarrhea, cough, shortness of breath or chest pain    Objective: Vital Signs: Blood pressure (!) 154/51, pulse 77, temperature 98.3 F (36.8 C), temperature source Oral, resp. rate 18, height 5' (1.524 m), weight 69.6 kg (153 lb 7 oz), SpO2 98 %. No results found. No results for input(s): WBC, HGB, HCT, PLT in the last 72 hours. No results for input(s): NA, K, CL, GLUCOSE, BUN, CREATININE, CALCIUM in the last 72 hours.  Invalid input(s): CO CBG (last 3)  Recent Labs    09/27/17 1716 09/27/17 2055 09/28/17 0648  GLUCAP 95 100* 93    Wt Readings from Last 3 Encounters:  09/06/17 69.6 kg (153 lb 7 oz)  09/02/17 70.8 kg (156 lb 1.4 oz)    Physical Exam:  Constitutional: She appears well-developed and well-nourished. No distress.  HENT: Normocephalic and atraumatic.  Eyes: EOMI. No discharge.  Cardiovascular: RRR without murmur. No JVD    Respiratory: CTA Bilaterally without wheezes or rales. Normal effort  Musculoskeletal: She exhibits no edema or tenderness.  Neurological: She is alert.  Right facial weakness  Speech dysarthric     Motor: Right upper extremity: Shoulder abduction 1+/5, distally 0/5 (stable).   No increase in tone Right lower extremity: 4--4/5 proximal to distal  Skin: Skin is warm and dry. She is not diaphoretic.  Psychiatric: Bright and engaging  Assessment/Plan: 1.  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 Physiatrist is providing close team supervision and 24 hour management of active medical problems listed below. Physiatrist and rehab team continue to assess barriers to discharge/monitor patient progress toward functional and medical  goals.  Function:  Bathing Bathing position   Position: Shower  Bathing parts Body parts bathed by patient: Chest, Abdomen, Front perineal area, Right upper leg, Left upper leg, Right arm, Buttocks, Right lower leg, Left lower leg, Left arm(uses long handled sponge) Body parts bathed by helper: Left arm, Back  Bathing assist Assist Level: Touching or steadying assistance(Pt > 75%)      Upper Body Dressing/Undressing Upper body dressing   What is the patient wearing?: Pull over shirt/dress   Bra - Perfomed by helper: Thread/unthread right bra strap, Thread/unthread left bra strap, Hook/unhook bra (pull down sports bra) Pull over shirt/dress - Perfomed by patient: Thread/unthread left sleeve, Put head through opening, Pull shirt over trunk Pull over shirt/dress - Perfomed by helper: Thread/unthread right sleeve        Upper body assist Assist Level: Touching or steadying assistance(Pt > 75%)      Lower Body Dressing/Undressing Lower body dressing   What is the patient wearing?: Pants, Socks, Underwear, Shoes Underwear - Performed by patient: Thread/unthread left underwear leg Underwear - Performed by helper: Thread/unthread right underwear leg, Pull underwear up/down Pants- Performed by patient: Thread/unthread right pants leg, Thread/unthread left pants leg Pants- Performed by helper: Pull pants up/down   Non-skid slipper socks- Performed by helper: Don/doff right sock, Don/doff left sock Socks - Performed by patient: Don/doff right sock, Don/doff left sock Socks - Performed by helper: Don/doff right sock, Don/doff left sock Shoes - Performed by patient: Don/doff left shoe Shoes - Performed by helper: Don/doff right shoe, Fasten right, Fasten left       TED Hose - Performed by  helper: Don/doff right TED hose, Don/doff left TED hose  Lower body assist Assist for lower body dressing: Touching or steadying assistance (Pt > 75%)      Toileting Toileting Toileting activity did  not occur: No continent bowel/bladder event Toileting steps completed by patient: Performs perineal hygiene Toileting steps completed by helper: Adjust clothing prior to toileting, Adjust clothing after toileting Toileting Assistive Devices: Grab bar or rail  Toileting assist Assist level: Two helpers   Transfers Chair/bed transfer   Chair/bed transfer method: Stand pivot Chair/bed transfer assist level: Moderate assist (Pt 50 - 74%/lift or lower) Chair/bed transfer assistive device: Armrests Mechanical lift: Landscape architect     Max distance: 15 Assist level: Maximal assist (Pt 25 - 49%)   Wheelchair Wheelchair activity did not occur: (unable secondary to height) Type: Manual Max wheelchair distance: 150 Assist Level: Supervision or verbal cues  Cognition Comprehension Comprehension assist level: Understands basic 90% of the time/cues < 10% of the time  Expression Expression assist level: Expresses basic 75 - 89% of the time/requires cueing 10 - 24% of the time. Needs helper to occlude trach/needs to repeat words.  Social Interaction Social Interaction assist level: Interacts appropriately 90% of the time - Needs monitoring or encouragement for participation or interaction.  Problem Solving Problem solving assist level: Solves basic 75 - 89% of the time/requires cueing 10 - 24% of the time  Memory Memory assist level: Recognizes or recalls 75 - 89% of the time/requires cueing 10 - 24% of the time   Medical Problem List and Plan: 1.  Functional, cognitive and mobility deficits  secondary to hemorrhagic left basal ganglia infarct    Cont CIR. PT, OT SLP   WHO, ROM, family education and process 2.  DVT Prophylaxis/Anticoagulation: Mechanical: Sequential compression devices, below knee Bilateral lower extremities   Dopplers neg for DVT 3. Pain Management: N/A 4. Mood: LCSW to follow for evaluation and support as mentation improves.  5. Neuropsych: This patient is not  capable of making decisions on her own behalf. 6. Skin/Wound Care: routine pressure relief measures.  7. Fluids/Electrolytes/Nutrition: Encourage oral intake. 8. HTN: Monitor BP qid.    Lisinopril increased to 20 mg BID on 12/19   HCTZ DC'd and chlorthalidone started on 12/23   Cont metoprolol.    Monitor renal status while on nectars--recheck electrolytes on Monday    Vitals:   09/27/17 2115 09/28/17 0447  BP: (!) 142/52 (!) 154/51  Pulse: 66 77  Resp: 16 18  Temp:  98.3 F (36.8 C)  SpO2:  98%  mildly elevated systolic 1/6, no med changes 9. Leukocytosis: Resolved 10 Dysphagia:    Advanced to D3 thin, cont to advance as tolerated 11. Dyslipidemia: On Lipitor  12. T2DM: Hgb A1C-7.1.  Monitor BS ac/hs. Resume metformin.  CBG (last 3)  Recent Labs    09/27/17 1716 09/27/17 2055 09/28/17 0648  GLUCAP 95 100* 93    controlled on 1/6 13. Hypokalemia   K+ 4.2 1/2   Cont to monitor  14. Obesity   Body mass index is 29.97 kg/m.  Diet and exercise education  Encourage weight loss to increase endurance and promote overall health   LOS (Days) 22 A FACE TO FACE EVALUATION WAS PERFORMED  Erick Colace, MD 09/28/2017 9:03 AM

## 2017-09-29 ENCOUNTER — Inpatient Hospital Stay (HOSPITAL_COMMUNITY): Payer: Self-pay | Admitting: Speech Pathology

## 2017-09-29 ENCOUNTER — Inpatient Hospital Stay (HOSPITAL_COMMUNITY): Payer: Self-pay

## 2017-09-29 ENCOUNTER — Encounter (HOSPITAL_COMMUNITY): Payer: Self-pay

## 2017-09-29 ENCOUNTER — Ambulatory Visit (HOSPITAL_COMMUNITY): Payer: Self-pay | Admitting: Physical Therapy

## 2017-09-29 ENCOUNTER — Inpatient Hospital Stay (HOSPITAL_COMMUNITY): Payer: Self-pay | Admitting: Occupational Therapy

## 2017-09-29 LAB — BASIC METABOLIC PANEL
ANION GAP: 10 (ref 5–15)
BUN: 21 mg/dL — ABNORMAL HIGH (ref 6–20)
CALCIUM: 9.7 mg/dL (ref 8.9–10.3)
CO2: 28 mmol/L (ref 22–32)
CREATININE: 0.81 mg/dL (ref 0.44–1.00)
Chloride: 103 mmol/L (ref 101–111)
GFR calc Af Amer: >60 mL/min (ref >60)
GFR calc non Af Amer: >60 mL/min (ref >60)
Glucose, Bld: 217 mg/dL — ABNORMAL HIGH (ref 65–99)
Potassium: 3.2 mmol/L — ABNORMAL LOW (ref 3.5–5.1)
SODIUM: 141 mmol/L (ref 135–145)

## 2017-09-29 LAB — GLUCOSE, CAPILLARY
GLUCOSE-CAPILLARY: 87 mg/dL (ref 65–99)
Glucose-Capillary: 106 mg/dL — ABNORMAL HIGH (ref 65–99)
Glucose-Capillary: 102 mg/dL — ABNORMAL HIGH (ref 65–99)
Glucose-Capillary: 122 mg/dL — ABNORMAL HIGH (ref 65–99)

## 2017-09-29 MED ORDER — POTASSIUM CHLORIDE CRYS ER 20 MEQ PO TBCR
20.0000 meq | EXTENDED_RELEASE_TABLET | Freq: Every day | ORAL | Status: DC
  Administered 2017-09-29 – 2017-09-30 (×2): 20 meq via ORAL
  Filled 2017-09-29 (×2): qty 1

## 2017-09-29 NOTE — Progress Notes (Signed)
Occupational Therapy Discharge Summary  Patient Details  Name: Cindy Thornton MRN: [id number] Date of Birth: [birth year]    Patient has met 38 of 11 long term goals due to improved activity tolerance, improved balance, postural control, ability to compensate for deficits, functional use of  RIGHT upper and RIGHT lower extremity, improved attention, improved awareness and improved coordination.  Patient to discharge at [facility] Assist level.  Patient's care partner is independent to provide the necessary physical and cognitive assistance at discharge.    Reasons goals not met: n/a  Recommendation:  Patient will benefit from ongoing skilled OT services in home health setting to continue to advance functional skills in the area of BADL.  Equipment: BSC  Reasons for discharge: treatment goals met  Patient/family agrees with progress made and goals achieved: Yes  OT Discharge Precautions/Restrictions  Precautions Precautions: Fall Precaution Comments: Rt hemi Restrictions Weight Bearing Restrictions: No ADL ADL ADL Comments: Please see functional navigator for ADL status Vision Vision Assessment?: Yes Visual Fields: Right visual field deficit Perception  Perception: Impaired Inattention/Neglect: Does not attend to right visual field(minimally impaired vs. max at admission) Praxis Praxis: Impaired Praxis Impairment Details: Motor planning Praxis-Other Comments: minimally impaired with new learning tasks Cognition Overall Cognitive Status: Impaired/Different from baseline Arousal/Alertness: Awake/alert Awareness: Impaired Awareness Impairment: Emergent impairment;Anticipatory impairment Sensation Sensation Light Touch Impaired Details: Impaired RLE;Impaired RUE Proprioception Impaired Details: Impaired RUE;Impaired RLE Coordination Gross Motor Movements are Fluid and Coordinated: No Fine Motor Movements are Fluid and Coordinated: No Coordination and Movement  Description: Rt hemiparesis UE>LE Motor  Motor Motor: Abnormal tone;Hemiplegia Motor - Discharge Observations: Rt hemiplegia Mobility    refer to functional navigator Trunk/Postural Assessment  Cervical Assessment Cervical Assessment: Within Functional Limits Thoracic Assessment Thoracic Assessment: (rounded shoulders) Lumbar Assessment Lumbar Assessment: (posterior pelvic tilt) Postural Control Postural Control: (impaired balance reactions)  Balance Static Sitting Balance Static Sitting - Level of Assistance: 6: Modified independent (Device/Increase time) Dynamic Sitting Balance Dynamic Sitting - Level of Assistance: 5: Stand by assistance Static Standing Balance Static Standing - Level of Assistance: 4: Min assist Dynamic Standing Balance Dynamic Standing - Level of Assistance: 4: Min assist Extremity/Trunk Assessment RUE Assessment RUE Assessment: Exceptions to [facility] RUE AROM (degrees) Right Shoulder Extension: 20 Degrees Right Shoulder Flexion: 20 Degrees Right Elbow Flexion: 80 Right Composite Finger Flexion: 25% LUE Assessment LUE Assessment: Within Functional Limits   See Function Navigator for Current Functional Status.  Cindy Thornton 09/29/2017, 12:43 PM

## 2017-09-29 NOTE — Progress Notes (Signed)
Occupational Therapy Session Note  Patient Details  Name: Cindy Thornton MRN: [id number] Date of Birth: Oct 02, 1950  Today's Date: 09/29/2017 OT Individual Time: 1100-1210 OT Individual Time Calculation (min): 70 min    Short Term Goals: Week 1:  OT Short Term Goal 1 (Week 1): Pt will complete UB dressing with Min A OT Short Term Goal 1 - Progress (Week 1): Progressing toward goal OT Short Term Goal 2 (Week 1): Pt will complete 1/3 components of donning pants OT Short Term Goal 2 - Progress (Week 1): Met OT Short Term Goal 3 (Week 1): Pt will complete toileting tasks with 1 helper OT Short Term Goal 3 - Progress (Week 1): Progressing toward goal Week 2:  OT Short Term Goal 1 (Week 2): Pt will be able to stand at toilet with mod A to support balance to allow her to cleanse and manage clothing. OT Short Term Goal 1 - Progress (Week 2): Met OT Short Term Goal 2 (Week 2): Pt will be able to don shirt with min A. OT Short Term Goal 2 - Progress (Week 2): Met OT Short Term Goal 3 (Week 2): Pt will be able to transfer on and off toilet with mod A of 1. OT Short Term Goal 3 - Progress (Week 2): Met Week 3:  OT Short Term Goal 1 (Week 3): STG=LTG due to LOS  Skilled Therapeutic Interventions/Progress Updates:    Pt seen for ADL training with family education with her daughter with the assist of a translator.  Reviewed with daughter the details of her home bathroom set up. The w/c with NOT fit in the bathroom, but pt is not safe to ambulate with A from family due to RLE weakness and scissoring movement patterns.  Discussed how once she is ambulating at a min A level with family after home health PT training, that she could use a tub bench. Discussed where they could purchase a low cost tub bench in the future.  Planned to practice transfers at end of session to give dtr an idea of how it is to be used.   Pt will be needing to use a BSC in the bedroom at home.  She worked on stand pivot to the [facility]  with min A and then repeat demonstration from her daughter.  Reviewed safe foot placement, sit to stand strategies. Pt was able to bathe with close S in seated using long sponge, UB dressing with set up of the R sleeve, LB dressing and toileting with min A.  Made suggestions to dtr for easy BSC clean up.  Explained at length to dtr with demonstration hemidressing techniques along with hand over hand guiding to further facilitate RUE AROM. Pt and family taken to tub room to practice transfer in the same direction and set up as her home bathroom.  Demonstrated transfer with pt to dtr. Emphasized that this should not be attempted until pt can safely ambulate with min A.  Pt taken back to room with all needs met.  Therapy Documentation Precautions:  Precautions Precautions: Fall Precaution Comments: Rt hemi Restrictions Weight Bearing Restrictions: No    Vital Signs: Therapy Vitals Pulse Rate: 79 BP: (!) 155/46 Patient Position (if appropriate): Lying Pain: Pain Assessment Pain Assessment: No/denies pain Faces Pain Scale: No hurt ADL: ADL ADL Comments: Please see functional navigator for ADL status  See Function Navigator for Current Functional Status.   Therapy/Group: Individual Therapy  [place] 09/29/2017, 12:26 PM

## 2017-09-29 NOTE — Progress Notes (Signed)
Physical Therapy Discharge Summary  Patient Details  Name: Cindy Thornton MRN: [id number] Date of Birth: 1951-08-30  Today's Date: 09/29/2017 PT Individual Time: 1000-1100 PT Individual Time Calculation (min): 60 min   Pt and daughter participated in pt/family education.  Pt propels w/c throughout unit in home and controlled environments with supervision.  Pt's daughter educated on w/c parts management and pt's daughter able to demo w/c leg rest removal and brakes.  Pt/daughter performed multiple squat pivot transfers at min A level throughotu session.  Pt/daughter educated on and performed simulated car transfer with min A.  Pt performed gait with hemi walker 50', 30' with min A, continues with scissoring of Rt LE but much improved awareness and ability to correct.  Bed mobility with pt able to perform sit to supine with supervision, min A for supine to sit.  Pt/daughter educated on positioning in bed and w/c, safety ideas for home and PT recommendation for pt to only walk with therapy at home.  Both pt and daughter verbalize understanding.  Pt performs stair negotiation x 4 stairs with min A.  Supine therex for core and LE strength and coordination with much improvement in Rt LE control.  Pt and daughter state they feel comfortable with d/c home tomorrow.  Patient has met 6 of 7 long term goals due to improved activity tolerance, improved balance, improved postural control, increased strength, ability to compensate for deficits, functional use of  right upper extremity and right lower extremity, improved awareness and improved coordination.  Patient to discharge at a wheelchair level [place].   Patient's care partner is independent to provide the necessary physical and cognitive assistance at discharge.  Reasons goals not met: pt requires min A for supine to sit  Recommendation:  Patient will benefit from ongoing skilled PT services in home health setting to continue to advance safe functional  mobility, address ongoing impairments in balance, gait, transfers, and minimize fall risk.  Equipment: w/c, hemiwalker  Reasons for discharge: treatment goals met and discharge from hospital  Patient/family agrees with progress made and goals achieved: Yes  PT Discharge Precautions/Restrictions Precautions Precautions: Fall Precaution Comments: Rt hemi Restrictions Weight Bearing Restrictions: No Pain Pain Assessment Pain Assessment: No/denies pain Faces Pain Scale: No hurt  Cognition Overall Cognitive Status: Impaired/Different from baseline Arousal/Alertness: Awake/alert Awareness: Impaired Awareness Impairment: Emergent impairment;Anticipatory impairment Sensation Sensation Light Touch Impaired Details: Impaired RLE;Impaired RUE Proprioception Impaired Details: Impaired RUE;Impaired RLE Coordination Gross Motor Movements are Fluid and Coordinated: No Fine Motor Movements are Fluid and Coordinated: No Coordination and Movement Description: Rt hemiparesis UE>LE Motor  Motor Motor: Abnormal tone;Hemiplegia Motor - Discharge Observations: Rt hemiplegia   Trunk/Postural Assessment  Cervical Assessment Cervical Assessment: Within Functional Limits Thoracic Assessment Thoracic Assessment: (rounded shoulders) Lumbar Assessment Lumbar Assessment: (posterior pelvic tilt) Postural Control Postural Control: (impaired balance reactions)  Balance Static Sitting Balance Static Sitting - Level of Assistance: 6: Modified independent (Device/Increase time) Dynamic Sitting Balance Dynamic Sitting - Level of Assistance: 5: Stand by assistance Static Standing Balance Static Standing - Level of Assistance: 4: Min assist Dynamic Standing Balance Dynamic Standing - Level of Assistance: 4: Min assist Extremity Assessment      RLE Assessment RLE Assessment: (grossly 3/5) LLE Assessment LLE Assessment: Within Functional Limits   See Function Navigator for Current Functional  Status.  Rayne Cowdrey 09/29/2017, 10:59 AM

## 2017-09-29 NOTE — Progress Notes (Signed)
Speech Language Pathology Discharge Summary  Patient Details  Name: Cindy Thornton MRN: [id number] Date of Birth: Jul 03, 1951  Today's Date: 09/29/2017 SLP Individual Time: 1300-1330 [phone] SLP Individual Time Calculation (min): 30 min and 60 min   Skilled Therapeutic Interventions:  1# Skilled ST services focused on family education, swallow, and speech skills. SLP facilitated PO consumption of regular textured foods and thin liquid via straw with no overt s/s aspiration and assistance with set up, pt demonstrated Mod I swallow strategies. SLP facilitated breathing exercises to increase vocal intensity with spirometry 10 repetitions of 4 sets and EMST  5 repetitions. SLP educated daugher about continuing breath support exercises, strategies for word finding in daily activities and swallow strategies. SLP provided visual aid as home reference. All questions were answered to pt and daughter satisfaction. Pt was left in room with daughter and interpreter.   2# Skilled ST services focused on speech and cognitive skills. SLP instructed pt in word finding skills given comm pictures, pt required min-supervision verbal cues. SLP facilitated EMST exercises  Of 5 repetitions and pt agreed to complete 5 more later in the day. SLP reviewed progress with pt and pt stated understanding. Pt was left in room with interpret.      Patient has met 6 of 6 long term goals.  Patient to discharge at overall Min;Supervision level.  Reasons goals not met:     Clinical Impression/Discharge Summary:   Pt demonstrated great progress meeting 6 out 6 goals and discharging at Mod I level for swallow skills and Min-Supervision for speech and cognitive skills. SLP is recommending pt continue swallow strategies with regular textured foods and thin liquids, continue breathing exercises to aid in vocal intensity and word finding strategies. Pt would continue to benefit from skilled ST services focusing on language and vocal  intensity goals in order to maximize functional independence and reduce burden of care with 24/hour supervision.   Care Partner:  Caregiver Able to Provide Assistance: Yes  Type of Caregiver Assistance: Physical;Cognitive  Recommendation:  Home Health SLP;24 hour supervision/assistance  Rationale for SLP Follow Up: Maximize functional communication;Maximize cognitive function and independence   Equipment: N/A   Reasons for discharge: Discharged from hospital   Patient/Family Agrees with Progress Made and Goals Achieved: Yes   Function:  Eating Eating   Modified Consistency Diet: No Eating Assist Level: More than reasonable amount of time   Eating Set Up Assist For: Opening containers       Cognition Comprehension Comprehension assist level: Understands basic 90% of the time/cues < 10% of the time  Expression Expression assistive device: Other (Comment)(interpreter) Expression assist level: Expresses basic 75 - 89% of the time/requires cueing 10 - 24% of the time. Needs helper to occlude trach/needs to repeat words.  Social Interaction Social Interaction assist level: Interacts appropriately 90% of the time - Needs monitoring or encouragement for participation or interaction.  Problem Solving Problem solving assist level: Solves basic 75 - 89% of the time/requires cueing 10 - 24% of the time  Memory Memory assist level: Recognizes or recalls 75 - 89% of the time/requires cueing 10 - 24% of the time   Sanah [place]  [facility] 09/29/2017, 4:38 PM

## 2017-09-29 NOTE — Progress Notes (Signed)
Cindy Thornton     PROGRESS NOTE   Subjective/Complaints:  Patient is in good spirits.  Cindy Thornton is just finishing up her breakfast.  Cindy Thornton is excited that Cindy Thornton is going home tomorrow  ROS: pt denies nausea, vomiting, diarrhea, cough, shortness of breath or chest pain   Objective: Vital Signs: Blood pressure (!) 155/46, pulse 79, temperature 98.7 F (37.1 C), temperature source Oral, resp. rate 18, height 5' (1.524 m), weight 69.6 kg (153 lb 7 oz), SpO2 97 %. No results found. No results for input(s): WBC, HGB, HCT, PLT in the last 72 hours. Recent Labs    09/29/17 0804  NA 141  K 3.2*  CL 103  GLUCOSE 217*  BUN 21*  CREATININE 0.81  CALCIUM 9.7   CBG (last 3)  Recent Labs    09/28/17 1634 09/28/17 2047 09/29/17 0627  GLUCAP 137* 121* 106*    Wt Readings from Last 3 Encounters:  09/06/17 69.6 kg (153 lb 7 oz)  09/02/17 70.8 kg (156 lb 1.4 oz)    Physical Exam:  Constitutional: Cindy Thornton appears well-developed and well-nourished. No distress.  HENT: Normocephalic and atraumatic.  Eyes: EOMI. No discharge.  Cardiovascular: RRR without murmur. No JVD     Respiratory: CTA Bilaterally without wheezes or rales. Normal effort   GI: BS +, non-tender, non-distended  Musculoskeletal: Cindy Thornton exhibits no edema or tenderness.  Neurological: Cindy Thornton is alert.  Right facial weakness  Speech dysarthric     Motor: Right upper extremity: Shoulder abduction 1+/5, distally 0/5 (unchanged).   No increase in tone Right lower extremity: 4--4/5 proximal to distal  Skin: Skin is warm and dry. Cindy Thornton is not diaphoretic.  Psychiatric: Bright and engaging  Assessment/Plan: 1.  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 Physiatrist is providing close team supervision and 24 hour management of active medical problems listed  below. Physiatrist and rehab team continue to assess barriers to discharge/monitor patient progress toward functional and medical goals.  Function:  Bathing Bathing position   Position: Shower  Bathing parts Body parts bathed by patient: Chest, Abdomen, Front perineal area, Right upper leg, Left upper leg, Right arm, Buttocks, Right lower leg, Left lower leg, Left arm(uses long handled sponge) Body parts bathed by helper: Left arm, Back  Bathing assist Assist Level: Touching or steadying assistance(Pt > 75%)      Upper Body Dressing/Undressing Upper body dressing   What is the patient wearing?: Pull over shirt/dress   Bra - Perfomed by helper: Thread/unthread right bra strap, Thread/unthread left bra strap, Hook/unhook bra (pull down sports bra) Pull over shirt/dress - Perfomed by patient: Thread/unthread left sleeve, Put head through opening, Pull shirt over trunk Pull over shirt/dress - Perfomed by helper: Thread/unthread right sleeve        Upper body assist Assist Level: Touching or steadying assistance(Pt > 75%)      Lower Body Dressing/Undressing Lower body dressing   What is the patient wearing?: Pants, Socks, Underwear, Shoes Underwear - Performed by patient: Thread/unthread left underwear leg Underwear - Performed by helper: Thread/unthread right underwear leg, Pull underwear up/down Pants- Performed by patient: Thread/unthread right pants leg, Thread/unthread left pants leg Pants- Performed by helper: Pull pants up/down   Non-skid slipper socks- Performed by helper: Don/doff right sock, Don/doff left sock Socks - Performed by patient: Don/doff right sock, Don/doff left sock Socks - Performed by helper:  Don/doff right sock, Don/doff left sock Shoes - Performed by patient: Don/doff left shoe Shoes - Performed by helper: Don/doff right shoe, Fasten right, Fasten left       TED Hose - Performed by helper: Don/doff right TED hose, Don/doff left TED hose  Lower body  assist Assist for lower body dressing: Touching or steadying assistance (Pt > 75%)      Toileting Toileting Toileting activity did not occur: No continent bowel/bladder event Toileting steps completed by patient: Performs perineal hygiene Toileting steps completed by helper: Adjust clothing prior to toileting, Adjust clothing after toileting Toileting Assistive Devices: Grab bar or rail  Toileting assist Assist level: Two helpers   Transfers Chair/bed transfer   Chair/bed transfer method: Stand pivot Chair/bed transfer assist level: Moderate assist (Pt 50 - 74%/lift or lower) Chair/bed transfer assistive device: Armrests Mechanical lift: Landscape architect     Max distance: 15 Assist level: Maximal assist (Pt 25 - 49%)   Wheelchair Wheelchair activity did not occur: (unable secondary to height) Type: Manual Max wheelchair distance: 150 Assist Level: Supervision or verbal cues  Cognition Comprehension Comprehension assist level: Understands basic 90% of the time/cues < 10% of the time  Expression Expression assist level: Expresses basic 75 - 89% of the time/requires cueing 10 - 24% of the time. Needs helper to occlude trach/needs to repeat words.  Social Interaction Social Interaction assist level: Interacts appropriately 90% of the time - Needs monitoring or encouragement for participation or interaction.  Problem Solving Problem solving assist level: Solves basic 75 - 89% of the time/requires cueing 10 - 24% of the time  Memory Memory assist level: Recognizes or recalls 75 - 89% of the time/requires cueing 10 - 24% of the time   Medical Problem List and Plan: 1.  Functional, cognitive and mobility deficits  secondary to hemorrhagic left basal ganglia infarct    Cont CIR. PT, OT SLP   WHO, ROM, family education and process   -Discharge 09/30/2017  2.  DVT Prophylaxis/Anticoagulation: Mechanical: Sequential compression devices, below knee Bilateral lower extremities    Dopplers neg for DVT 3. Pain Management: N/A 4. Mood: LCSW to follow for evaluation and support as mentation improves.  5. Neuropsych: This patient is not capable of making decisions on her own behalf. 6. Skin/Wound Care: routine pressure relief measures.  7. Fluids/Electrolytes/Nutrition: Encourage oral intake.   -Labs reviewed.  BUN and creatinine are generally stable.  Potassium is slightly low.  Replete potassium 8. HTN: Monitor BP qid.    Lisinopril increased to 20 mg BID on 12/19   HCTZ DC'd and chlorthalidone started on 12/23   Cont metoprolol.         Vitals:   09/29/17 0443 09/29/17 0856  BP: (!) 151/43 (!) 155/46  Pulse: 69 79  Resp: 18   Temp: 98.7 F (37.1 C)   SpO2: 97%   mildly elevated systolic 1/7, no med changes--further titration once home 9. Leukocytosis: Resolved 10 Dysphagia:    Continue D3 thin, cont to advance as tolerated 11. Dyslipidemia: On Lipitor  12. T2DM: Hgb A1C-7.1.  Monitor BS ac/hs. Resume metformin.  CBG (last 3)  Recent Labs    09/28/17 1634 09/28/17 2047 09/29/17 0627  GLUCAP 137* 121* 106*    controlled on 1/6 13. Hypokalemia   K+ 4.2 1/2   Cont to monitor  14. Obesity   Body mass index is 29.97 kg/m.  Diet and exercise education  Encourage weight loss to increase endurance and  promote overall health   LOS (Days) 23 A FACE TO FACE EVALUATION WAS PERFORMED  Faith RogueSWARTZ,Charolett Yarrow T, MD 09/29/2017 10:00 AM

## 2017-09-30 LAB — GLUCOSE, CAPILLARY: GLUCOSE-CAPILLARY: 97 mg/dL (ref 65–99)

## 2017-09-30 MED ORDER — SENNOSIDES-DOCUSATE SODIUM 8.6-50 MG PO TABS: 1.0000 | ORAL_TABLET | Freq: Two times a day (BID) | ORAL | 0 refills | Status: DC

## 2017-09-30 MED ORDER — METOPROLOL SUCCINATE ER 50 MG PO TB24: 50.0000 mg | ORAL_TABLET | Freq: Every day | ORAL | 0 refills | Status: DC

## 2017-09-30 MED ORDER — POTASSIUM CHLORIDE CRYS ER 20 MEQ PO TBCR: 20.0000 meq | EXTENDED_RELEASE_TABLET | Freq: Every day | ORAL | 0 refills | Status: AC

## 2017-09-30 MED ORDER — METFORMIN HCL ER (MOD) 1000 MG PO TB24: 1000.0000 mg | ORAL_TABLET | Freq: Every day | ORAL | 0 refills | Status: DC

## 2017-09-30 MED ORDER — PANTOPRAZOLE SODIUM 40 MG PO TBEC: 40.0000 mg | DELAYED_RELEASE_TABLET | Freq: Every day | ORAL | 0 refills | Status: AC

## 2017-09-30 MED ORDER — LISINOPRIL 20 MG PO TABS: 20.0000 mg | ORAL_TABLET | Freq: Two times a day (BID) | ORAL | 0 refills | Status: AC

## 2017-09-30 MED ORDER — METOPROLOL SUCCINATE ER 50 MG PO TB24: 50.0000 mg | ORAL_TABLET | Freq: Every day | ORAL | 0 refills | Status: AC

## 2017-09-30 MED ORDER — POTASSIUM CHLORIDE CRYS ER 20 MEQ PO TBCR: 20.0000 meq | EXTENDED_RELEASE_TABLET | Freq: Every day | ORAL | 0 refills | Status: DC

## 2017-09-30 MED ORDER — PANTOPRAZOLE SODIUM 40 MG PO TBEC: 40.0000 mg | DELAYED_RELEASE_TABLET | Freq: Every day | ORAL | 0 refills | Status: DC

## 2017-09-30 MED ORDER — ATORVASTATIN CALCIUM 20 MG PO TABS: 20.0000 mg | ORAL_TABLET | Freq: Every day | ORAL | 0 refills | Status: DC

## 2017-09-30 MED ORDER — CHLORTHALIDONE 25 MG PO TABS: 25.0000 mg | ORAL_TABLET | Freq: Every day | ORAL | 0 refills | Status: AC

## 2017-09-30 MED ORDER — CHLORTHALIDONE 25 MG PO TABS: 25.0000 mg | ORAL_TABLET | Freq: Every day | ORAL | 0 refills | Status: DC

## 2017-09-30 MED ORDER — ATORVASTATIN CALCIUM 20 MG PO TABS: 20.0000 mg | ORAL_TABLET | Freq: Every day | ORAL | 0 refills | Status: AC

## 2017-09-30 MED ORDER — POTASSIUM CHLORIDE CRYS ER 20 MEQ PO TBCR: 20.0000 meq | EXTENDED_RELEASE_TABLET | Freq: Every day | ORAL | Status: DC

## 2017-09-30 MED ORDER — LISINOPRIL 20 MG PO TABS: 20.0000 mg | ORAL_TABLET | Freq: Two times a day (BID) | ORAL | 0 refills | Status: DC

## 2017-09-30 NOTE — Discharge Instructions (Signed)
Inpatient Rehab Discharge Instructions  Cindy PewMaria Parra Thornton Discharge date and time:  09/30/17  Activities/Precautions/ Functional Status: Activity: no lifting, driving, or strenuous exercise  till cleared by MD Diet: Diabetic/heart healthy.  Wound Care:    Functional status:  ___ No restrictions     ___ Walk up steps independently _X__ 24/7 supervision/assistance   ___ Walk up steps with assistance ___ Intermittent supervision/assistance  ___ Bathe/dress independently ___ Walk with walker     ___ Bathe/dress with assistance ___ Walk Independently    ___ Shower independently ___ Walk with assistance    _X__ Shower with assistance _X__ No alcohol     ___ Return to work/school ________    COMMUNITY REFERRALS UPON DISCHARGE:    Home Health:   PT     OT     ST                      Agency:  [facility] Phone: [phone]   Medical Equipment/Items Ordered:  Wheelchair, cushion, walker, commode and tub seat                                                       Agency/Supplier:  [facility] @ [phone]  Special Instructions: 1. Needs assistance with all activity. 2. Monitor blood sugars twice a day and take record to MD office for evaluation.    STROKE/TIA DISCHARGE INSTRUCTIONS SMOKING Cigarette smoking nearly doubles your risk of having a stroke & is the single most alterable risk factor  If you smoke or have smoked in the last 12 months, you are advised to quit smoking for your health.  Most of the excess cardiovascular risk related to smoking disappears within a year of stopping.  Ask you doctor about anti-smoking medications  [place] Quit Line: 1-800-QUIT NOW  Free Smoking Cessation Classes [phone]  CHOLESTEROL Know your levels; limit fat & cholesterol in your diet  Lipid Panel     Component Value Date/Time   CHOL 114 09/03/2017 0804   TRIG 113 09/03/2017 0804   HDL 44 09/03/2017 0804   CHOLHDL 2.6 09/03/2017 0804   VLDL 23 09/03/2017 0804   LDLCALC 47  09/03/2017 0804      Many patients benefit from treatment even if their cholesterol is at goal.  Goal: Total Cholesterol (CHOL) less than 160  Goal:  Triglycerides (TRIG) less than 150  Goal:  HDL greater than 40  Goal:  LDL (LDLCALC) less than 100   BLOOD PRESSURE American Stroke Association blood pressure target is less that 120/80 mm/Hg  Your discharge blood pressure is:  BP: (!) 124/48  Monitor your blood pressure  Limit your salt and alcohol intake  Many individuals will require more than one medication for high blood pressure  DIABETES (A1c is a blood sugar average for last 3 months) Goal HGBA1c is under 7% (HBGA1c is blood sugar average for last 3 months)  Diabetes:     Lab Results  Component Value Date   HGBA1C 7.1 (H) 09/03/2017     Your HGBA1c can be lowered with medications, healthy diet, and exercise.  Check your blood sugar as directed by your physician  Call your physician if you experience unexplained or low blood sugars.  PHYSICAL ACTIVITY/REHABILITATION Goal is 30 minutes at least 4 days per week  Activity: No driving, Therapies: see above Return to work: N/A  Activity decreases your risk of heart attack and stroke and makes your heart stronger.  It helps control your weight and blood pressure; helps you relax and can improve your mood.  Participate in a regular exercise program.  Talk with your doctor about the best form of exercise for you (dancing, walking, swimming, cycling).  DIET/WEIGHT Goal is to maintain a healthy weight  Your discharge diet is: Diet regular Room service appropriate? Yes; Fluid consistency: Thin liquids Your height is:  Height: 5' (152.4 cm) Your current weight is: Weight: 69.6 kg (153 lb 7 oz) Your Body Mass Index (BMI) is:  BMI (Calculated): 29.97  Following the type of diet specifically designed for you will help prevent another stroke.  Your goal weight  is:  128 lbs  Your goal Body Mass Index (BMI) is  19-24.  Healthy food habits can help reduce 3 risk factors for stroke:  High cholesterol, hypertension, and excess weight.  RESOURCES Stroke/Support Group:  Call [phone]   STROKE EDUCATION PROVIDED/REVIEWED AND GIVEN TO PATIENT Stroke warning signs and symptoms How to activate emergency medical system (call 911). Medications prescribed at discharge. Need for follow-up after discharge. Personal risk factors for stroke. Pneumonia vaccine given:  Flu vaccine given:  My questions have been answered, the writing is legible, and I understand these instructions.  I will adhere to these goals & educational materials that have been provided to me after my discharge from the hospital.     My questions have been answered and I understand these instructions. I will adhere to these goals and the provided educational materials after my discharge from the hospital.  Patient/Caregiver Signature _______________________________ Date __________  Clinician Signature _______________________________________ Date __________  Please bring this form and your medication list with you to all your follow-up doctor's appointments.

## 2017-09-30 NOTE — Progress Notes (Signed)
Patient scheduled for Discharge today. Discharge instructions provided to Patient and Daughter by P. Love, PA with Journalist, newspaperLanguage Resources personnel present.  Patient discharged to Home with Discharge Instructions and Personal belongings accompanied by Daughter.

## 2017-09-30 NOTE — Progress Notes (Signed)
Social Work  Discharge Note  The overall goal for the admission was met for:   Discharge location: Yes - home with daughter, Gaynelle Adu, in [place] of Stay: Yes - 24 days  Discharge activity level: Yes - minimal assistance overall  Home/community participation: Yes  Services provided included: MD, RD, PT, OT, SLP, RN, TR, Pharmacy and SW and interpreter  Financial Services: No insurance eligibility except emergency MA covering admission on acute  Follow-up services arranged: Home Health: PT, OT, ST via [facility], DME: 18x16 super hemi w/c with right 1/2 lap tray, basic cushion, hemi walker, 3n1 commode and tub seat via [facility] and Patient/Family has no preference for HH/DME agencies  Comments (or additional information):  Patient/Family verbalized understanding of follow-up arrangements: Yes  Individual responsible for coordination of the follow-up plan: daughter, Gaynelle Adu  Confirmed correct DME delivered: Lennart Pall 09/30/2017    Kamsiyochukwu Buist, Lorre Nick

## 2017-09-30 NOTE — Discharge Summary (Signed)
Physician Discharge Summary  Patient ID: Odette Watanabe MRN: [id number] DOB/AGE: 1951-06-09 67 y.o.  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5 K/uL 8.6 9.2 8.1  Hemoglobin 12.0 - 15.0 g/dL 52.8 41.3 24.4  Hematocrit 36.0 - 46.0 % 38.5 39.6 38.6  Platelets 150 - 400 K/uL 170 184 198    CBG: Recent Labs  Lab 09/29/17 0627 09/29/17 1210 09/29/17 1634 09/29/17 2101 09/30/17 0657  GLUCAP 106* 87 102* 122* 97     Brief HPI:     Jenice Leiner Sotais a 67 y.o.femalewith history of T2DM, HTN who was admitted on 09/02/17 after being found by family with left sided weakness, right facial droop and difficulty speaking.CT headreviewed, showing left basal ganglia hemorrhage  with surrounding edema and mild regional mass effect. Dr. Pearlean Brownie felt to be hypertensive in nature and she was started on nicardipine drip. MRI brain done revealing hemorrhage in left basal ganglia infarct, advanced chronic small vessel disease and chronic left  parietal infarct.  Lethargy was resolving and she was placed on dysphagia 1, nectar liquids. Patient with resultant right sided weakness with sensory deficits as well as severe dysarthria, minimal breath support and expressive deficits.  CIR recommended by therapy team due to functional decline.   Hospital Course: Coni Homesley was admitted to rehab 09/06/2017 for inpatient therapies to consist of PT, ST and OT at least three hours five days a week. Past admission physiatrist, therapy team and rehab RN have worked together to provide customized collaborative inpatient rehab. Blood pressures have been monitored qid and lisinopril was titrated upwards for tighter control. HCTZ was discontinued due to AKI and chorthalidone was added with improvement in BP control.  Follow up labs showed improvement in renal status but mild hypokalemia. She is to continue on K-dur for a week with follow up labs after discharge.   Metformin was resumed and diabetes has been monitored with ac/hs cbg checks. Po intake has been good and BS are well controlled. She is continent of bowel and bladder. Swallow function has improved and she was advanced to regular textures, thin liquids. Mood has been stable and she has progressed to min assist. She has has some improvement in right intrinsics but continues to require cues to compensate for right inattention with question visual deficits.  She will continue to receive follow up HHPT, HHOT, HHST by [facility] after discharge.    Rehab course: During patient's stay in rehab  weekly team conferences were held to monitor patient's progress, set goals and discuss barriers to discharge.  At admission, patient required total assist with basic self care tasks and max assist with mobility. She exhibited severe expressive deficits with mild dysarthria, decreased vocal quality, dysphagia and decreased abiltiy to follow two step commands. She has had improvement in activity tolerance,  balance, postural control, as well as ability to compensate for deficits.  She is able to complete ADL tasks with min assist. She is able to propel her wheelchair at supervision level.She is able to perform transfers with min assist and ambulate 30- 50" with hemi walker.  She is tolerating regular diet without signs or symptoms of aspiration. Vocal intensity has improved and she requires min assist to supervision with cognitive tasks.  Family education was completed regarding all aspects of care, breath support exercises,  management of wheelchair as well as safety with mobility.   Disposition: 01-Home or Self Care  Diet: Diabetic/heart Healthy.   Special Instructions: 1. Needs BMET rechecked in 7-10 days. 2. Needs assistance with mobility and medication management.   Discharge Instructions    Ambulatory referral to Physical Medicine Rehab   Complete by:  As directed    1-2 weeks transitional care appt     Allergies as of 09/30/2017   No Known Allergies     Medication List    STOP taking these medications   lisinopril-hydrochlorothiazide 10-12.5 MG tablet Commonly known as:  PRINZIDE,ZESTORETIC     TAKE these medications   atorvastatin 20 MG tablet Commonly known as:  LIPITOR Take 1 tablet (20 mg total) by mouth daily.   chlorthalidone 25 MG tablet Commonly known as:  HYGROTON Take 1 tablet (25 mg total) by mouth daily. Start taking on:  10/01/2017   lisinopril 20 MG tablet Commonly known as:  PRINIVIL,ZESTRIL Take 1 tablet (20 mg total) by mouth 2 (two) times daily.   metFORMIN 1000 MG (MOD) 24 hr tablet Commonly known as:  GLUMETZA Take 1 tablet (1,000 mg total) by mouth daily. What changed:  medication strength   metoprolol succinate 50 MG 24 hr tablet Commonly known as:  TOPROL-XL Take 1 tablet (50 mg total) by mouth daily. Take with or immediately following a meal.   pantoprazole 40 MG tablet Commonly known as:  PROTONIX Take 1 tablet (40 mg total) by mouth  daily.   potassium chloride SA 20 MEQ tablet Commonly known as:  K-DUR,KLOR-CON Take 1 tablet (20 mEq total) by mouth daily. Start taking on:  10/01/2017   senna-docusate 8.6-50 MG tablet Commonly known as:  Senokot-S Take 1 tablet by mouth 2 (two) times daily.      Follow-up Information    Marcello FennelPatel, Ankit Anil, MD Follow up.   Specialty:  Physical Medicine and Rehabilitation Why:  office will call you with follow up appointment Contact information: [address] [place] [address] [phone]        Micki RileySethi, Pramod S, MD. Call in 1 day(s).   Specialties:  Neurology, Radiology Why:  for follow up appointment in 4 weeks Contact information: [address] [place] [address] [phone]        Driggers, Gerome ApleyLaura A, PA-C Follow up on 10/07/2017.   Specialty:  Physician Assistant Why:  @ 11:00 am (hospital follow up appt) Contact information: [address] [place] [address] [phone]           Signed: Jacquelynn Creeamela S Love 09/30/2017, 4:22 PM

## 2017-09-30 NOTE — Progress Notes (Signed)
[place] PHYSICAL MEDICINE & REHABILITATION     PROGRESS NOTE   Subjective/Complaints: Pt seen lying in bed this AM.  She slept well overnight and is happy about going home today.   ROS: Denies nausea, vomiting, diarrhea, shortness of breath or chest pain   Objective: Vital Signs: Blood pressure (!) 124/48, pulse 78, temperature 98 F (36.7 C), temperature source Oral, resp. rate 20, height 5' (1.524 m), weight 69.6 kg (153 lb 7 oz), SpO2 99 %. No results found. No results for input(s): WBC, HGB, HCT, PLT in the last 72 hours. Recent Labs    09/29/17 0804  NA 141  K 3.2*  CL 103  GLUCOSE 217*  BUN 21*  CREATININE 0.81  CALCIUM 9.7   CBG (last 3)  Recent Labs    09/29/17 1634 09/29/17 2101 09/30/17 0657  GLUCAP 102* 122* 97    Wt Readings from Last 3 Encounters:  09/06/17 69.6 kg (153 lb 7 oz)  09/02/17 70.8 kg (156 lb 1.4 oz)    Physical Exam:  Constitutional: She appears well-developed and well-nourished. No distress.  HENT: Normocephalic and atraumatic.  Eyes: EOMI. No discharge.  Cardiovascular: RRR. No JVD     Respiratory: CTA Bilaterally. Normal effort   GI: BS +, non-distended  Musculoskeletal: She exhibits no edema or tenderness.  Neurological: She is alert.  Right facial weakness  Speech dysarthric     Motor: Right upper extremity: Shoulder abduction 1+/5, distally 0/5 (stable).   Right lower extremity: 4/5 proximal to distal  Skin: Skin is warm and dry. She is not diaphoretic.  Psychiatric: Appears to have normal mood and behavior, however, limited by language  Assessment/Plan: 1.  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 Physiatrist is providing close team supervision and 24 hour management of active medical problems listed below. Physiatrist and rehab team continue to assess barriers to discharge/monitor  patient progress toward functional and medical goals.  Function:  Bathing Bathing position   Position: Other (comment)(sitting on BSC)  Bathing parts Body parts bathed by patient: Chest, Abdomen, Front perineal area, Right upper leg, Left upper leg, Right arm, Buttocks, Right lower leg, Left lower leg, Left arm(used long sponge) Body parts bathed by helper: Left arm, Back  Bathing assist Assist Level: Touching or steadying assistance(Pt > 75%)      Upper Body Dressing/Undressing Upper body dressing   What is the patient wearing?: Pull over shirt/dress   Bra - Perfomed by helper: Thread/unthread right bra strap, Thread/unthread left bra strap, Hook/unhook bra (pull down sports bra) Pull over shirt/dress - Perfomed by patient: Thread/unthread left sleeve, Put head through opening, Pull shirt over trunk Pull over shirt/dress - Perfomed by helper: Thread/unthread right sleeve        Upper body assist Assist Level: Touching or steadying assistance(Pt > 75%)      Lower Body Dressing/Undressing Lower body dressing   What is the patient wearing?: Pants, Socks, Underwear, Shoes Underwear - Performed by patient: Thread/unthread left underwear leg, Thread/unthread right underwear leg Underwear - Performed by helper: Pull underwear up/down Pants- Performed by patient: Thread/unthread right pants leg, Thread/unthread left pants leg Pants- Performed by helper: Pull pants up/down   Non-skid slipper socks- Performed by helper: Don/doff right sock, Don/doff left sock Socks - Performed by patient: Don/doff left sock Socks - Performed by helper: Don/doff right sock Shoes - Performed by patient: Don/doff left  shoe Shoes - Performed by helper: Don/doff right shoe, Fasten right, Fasten left   AFO - Performed by helper: Don/doff right AFO   TED Hose - Performed by helper: Don/doff right TED hose, Don/doff left TED hose  Lower body assist Assist for lower body dressing: Touching or steadying  assistance (Pt > 75%)      Toileting Toileting Toileting activity did not occur: No continent bowel/bladder event Toileting steps completed by patient: Performs perineal hygiene, Adjust clothing prior to toileting Toileting steps completed by helper: Adjust clothing after toileting Toileting Assistive Devices: Grab bar or rail  Toileting assist Assist level: Two helpers   Transfers Chair/bed transfer   Chair/bed transfer method: Stand pivot Chair/bed transfer assist level: Touching or steadying assistance (Pt > 75%) Chair/bed transfer assistive device: Armrests Mechanical lift: Landscape architecttedy   Locomotion Ambulation     Max distance: 50 Assist level: Touching or steadying assistance (Pt > 75%)   Wheelchair Wheelchair activity did not occur: (unable secondary to height) Type: Manual Max wheelchair distance: 150 Assist Level: Supervision or verbal cues  Cognition Comprehension Comprehension assist level: Understands basic 90% of the time/cues < 10% of the time  Expression Expression assist level: Expresses basic 75 - 89% of the time/requires cueing 10 - 24% of the time. Needs helper to occlude trach/needs to repeat words.  Social Interaction Social Interaction assist level: Interacts appropriately 90% of the time - Needs monitoring or encouragement for participation or interaction.  Problem Solving Problem solving assist level: Solves basic 75 - 89% of the time/requires cueing 10 - 24% of the time  Memory Memory assist level: Recognizes or recalls 75 - 89% of the time/requires cueing 10 - 24% of the time   Medical Problem List and Plan: 1.  Functional, cognitive and mobility deficits  secondary to hemorrhagic left basal ganglia infarct    D/c today    Will see patient for transitional care management in 1-2 weeks   WHO, ROM, family education and process 2.  DVT Prophylaxis/Anticoagulation: Mechanical: Sequential compression devices, below knee Bilateral lower extremities   Dopplers neg  for DVT 3. Pain Management: N/A 4. Mood: LCSW to follow for evaluation and support as mentation improves.  5. Neuropsych: This patient is not capable of making decisions on her own behalf. 6. Skin/Wound Care: routine pressure relief measures.  7. Fluids/Electrolytes/Nutrition: Encourage oral intake.   -Labs reviewed.  BUN and creatinine are generally stable.  Potassium is slightly low.  Replete potassium 8. HTN: Monitor BP qid.    Lisinopril increased to 20 mg BID on 12/19   HCTZ DC'd and chlorthalidone started on 12/23   Cont metoprolol.         Vitals:   09/29/17 2000 09/30/17 0212  BP: (!) 126/45 (!) 124/48  Pulse: 80 78  Resp: 20 20  Temp:  98 F (36.7 C)  SpO2: 99% 99%    Relatively controlled on 1/8 9. Leukocytosis: Resolved 10 Dysphagia:    Advanced to regular diet 11. Dyslipidemia: On Lipitor  12. T2DM: Hgb A1C-7.1.  Monitor BS ac/hs. Resume metformin.  CBG (last 3)  Recent Labs    09/29/17 1634 09/29/17 2101 09/30/17 0657  GLUCAP 102* 122* 97    controlled on 1/8 13. Hypokalemia   K+ 3.2 1/7   Supplemented increased for 6 days.   Cont to monitor  14. Obesity   Body mass index is 29.97 kg/m.  Diet and exercise education  Encourage weight loss to increase endurance and promote overall health  LOS (Days) 24 A FACE TO FACE EVALUATION WAS PERFORMED  Miko Markwood Karis Juba, MD 09/30/2017 9:23 AM

## 2017-10-01 ENCOUNTER — Telehealth: Payer: Self-pay

## 2017-10-01 NOTE — Telephone Encounter (Signed)
[facility]ie [facility] orders for [facility] for 1xwk X 2wks, then 1xwk X every other week, then 1xwk X 3wks.   Called her back and approved verbal orders.

## 2017-10-01 NOTE — Telephone Encounter (Signed)
Transitional Care call  Patient name: Cindy Thornton(Cindy Thornton) DOB: (Jan 13, 1951) 1. Are you/is patient experiencing any problems since coming home? (no) a. Are there any questions regarding any aspect of care? (no) 2. Are there any questions regarding medications administration/dosing? (no) a. Are meds being taken as prescribed? (patient is getting medications today (10/01/17)) b. "Patient should review meds with caller to confirm"  3. Have there been any falls? (no) 4. Has Home Health been to the house and/or have they contacted you? (yes) a. If not, have you tried to contact them? (N/A) b. Can we help you contact them? (no) 5. Are bowels and bladder emptying properly? (yes) a. Are there any unexpected incontinence issues? (no) b. If applicable, is patient following bowel/bladder programs? (N/A) 6. Any fevers, problems with breathing, unexpected pain? (no) 7. Are there any skin problems or new areas of breakdown? (no) 8. Has the patient/family member arranged specialty MD follow up (ie cardiology/neurology/renal/surgical/etc.)?  (yes) a. Can we help arrange? (no) 9. Does the patient need any other services or support that we can help arrange? (no) 10. Are caregivers following through as expected in assisting the patient? (yes) 11. Has the patient quit smoking, drinking alcohol, or using drugs as recommended? (yes, never smoked)  Appointment date/time (10/09/17 @ 1:40), arrive time (1:20) and who it is with here Allena Katz(Patel) [address]

## 2017-10-06 ENCOUNTER — Telehealth: Payer: Self-pay

## 2017-10-06 NOTE — Telephone Encounter (Signed)
[facility] 1 time a wk for 2 wks, twice a wk for 2 wks, and 1 time a wk for 3 wks.

## 2017-10-06 NOTE — Telephone Encounter (Signed)
I spoke with [facility]yan [facility] and gave verbal orders per office protocol.

## 2017-10-09 ENCOUNTER — Encounter: Payer: Self-pay | Attending: Physical Medicine & Rehabilitation | Admitting: Physical Medicine & Rehabilitation

## 2017-10-10 ENCOUNTER — Telehealth: Payer: Self-pay

## 2017-10-10 NOTE — Telephone Encounter (Signed)
Tried Research scientist (medical)calling [facility] but didn't feel comfortable leaving a message because he did not specify that his voice mail was confidential.

## 2017-10-10 NOTE — Telephone Encounter (Signed)
[facility] verbal orders for 1 time a week for 1 week, 2 times a week for 3 weeks, and 1 time a week for 1 week.

## 2017-10-28 ENCOUNTER — Telehealth: Payer: Self-pay

## 2017-10-28 NOTE — Telephone Encounter (Signed)
ST Therapist called, stated that this patient has missed several ST appointments.

## 2017-12-02 ENCOUNTER — Encounter (INDEPENDENT_AMBULATORY_CARE_PROVIDER_SITE_OTHER): Payer: Self-pay

## 2017-12-02 ENCOUNTER — Encounter: Payer: Self-pay | Admitting: Adult Health

## 2017-12-02 ENCOUNTER — Telehealth: Payer: Self-pay | Admitting: Adult Health

## 2017-12-02 ENCOUNTER — Ambulatory Visit: Payer: Self-pay | Admitting: Adult Health

## 2017-12-02 VITALS — BP 155/69 | HR 87 | Ht <= 58 in

## 2017-12-02 DIAGNOSIS — G8191 Hemiplegia, unspecified affecting right dominant side: Secondary | ICD-10-CM

## 2017-12-02 DIAGNOSIS — I629 Nontraumatic intracranial hemorrhage, unspecified: Secondary | ICD-10-CM

## 2017-12-02 NOTE — Progress Notes (Signed)
[facility] [address] [place]. [place] [id number] [phone]       OFFICE FOLLOW UP NOTE  Ms. Cindy Thornton Date of Birth:  21-Apr-1951 Medical Record Number:  [id number]   Reason for Referral: hospital stroke follow up  CHIEF COMPLAINT:  Chief Complaint  Patient presents with  . Follow-up    Stroke follow up .Pt saw Dr. Roda ShuttersXu in hospital. PT is spanish speaking,and her son is interpreting [name]     HPI: Cindy Thornton is being seen today for initial visit in the office for left basal ganglia ICH on 09/02/17. History obtained from patient and chart review. Reviewed all radiology images and labs personally.   Cindy FiremanMaria Para Sotais a 67 y.o.femalewith a history of HTN, HLD, and DM who presented with right-sided weakness that started earlier on the day on 09/02/17. She was last known well around 1 PM that day. Her niece left to go to an event and then around 3:30 PM, she talked to her son on the phone, but he could tell something was not right. He then drove and found her on the floor, not responding very much. EMS was therefore called and transported her as a code stroke. CT head reviewed and showed a basal ganglia hemorrhage possibly hypertensive in etiology. Patient admitted to ICU for pressure control. CTA head/neck reviewed and showed aortic atherosclerosis, 20% stenosis of proximal left subclavian artery, and arthrosclerotic plaque affecting hte common carotid arteries without stenosis. MRI reviewed and showed hemorrhagic left basal ganglia infarct and chronic left parietal infarct. 2D echo with EF of 60-65% and moderate LVH. Patient was previously on ASA prior to admission which was placed on hold. LDL 47 - was previously on lipitor 20mg  and this was continued at discharge. A1c 7.1 - on SSI during admission. Patient was discharged to CIR. It was recommended by neurology that patient have repeat CT head prior to CIR discharge and if bleed resolved, ASA 81mg  will  be restarted. Patient was discharged home in stable condition on 09/30/17.   Since discharge, patient continues to have right hemiparesis and dysarthria.  Patient is accompanied today by her son.  Patient primarily speaks Spanish and waiver was signed to declined interpreter as son will interpret.  Patient currently living with his sister who assists her in most ADLs and all IADLs.  Patient did have 1 week of home PT/OT/SLP but has not received further therapies as outpatient.  Currently sitting in wheelchair and only able to take approximately 7-10 steps at a time.  Continues to take Lipitor without side effects of myalgias.  Blood pressure was monitored with home therapies but has not been monitoring the last few weeks.  At today's appointment blood pressure is 155/69.  Advised son to obtain a cuff in order to monitor blood pressure levels at home.   Continues to have right hemiparesis with moderate dysarthria percent. Per son, metformin was causing diarrhea so this was stopped approximately 1 month ago.  Son is unsure of BG levels as his sister usually checks these.  Also complains of recent productive cough for past 1 week. Patient denies shortness of breath.  Patient has not been evaluated for this cough.  Denies new or worsening stroke/TIA symptoms.  ROS:   14 system review of systems performed and negative with exception of back pain, walking difficulty, dizziness, numbness, difficulty walking, and weakness  PMH:  Past Medical History:  Diagnosis Date  . Diabetes mellitus without complication (HCC)   . Hyperlipidemia   .  Hypertension     PSH: History reviewed. No pertinent surgical history.  Social History:  Social History   Socioeconomic History  . Marital status: Single    Spouse name: Not on file  . Number of children: Not on file  . Years of education: Not on file  . Highest education level: Not on file  Social Needs  . Financial resource strain: Not on file  . Food insecurity -  worry: Not on file  . Food insecurity - inability: Not on file  . Transportation needs - medical: Not on file  . Transportation needs - non-medical: Not on file  Occupational History  . Not on file  Tobacco Use  . Smoking status: Never Smoker  . Smokeless tobacco: Never Used  Substance and Sexual Activity  . Alcohol use: No    Frequency: Never  . Drug use: No  . Sexual activity: Not on file  Other Topics Concern  . Not on file  Social History Narrative  . Not on file    Family History:  Family History  Problem Relation Age of Onset  . Healthy Sister     Medications:   Current Outpatient Medications on File Prior to Visit  Medication Sig Dispense Refill  . atorvastatin (LIPITOR) 20 MG tablet Take 1 tablet (20 mg total) by mouth daily. 30 tablet 0  . chlorthalidone (HYGROTON) 25 MG tablet Take 1 tablet (25 mg total) by mouth daily. 30 tablet 0  . lisinopril (PRINIVIL,ZESTRIL) 20 MG tablet Take 1 tablet (20 mg total) by mouth 2 (two) times daily. 60 tablet 0  . metoprolol succinate (TOPROL-XL) 50 MG 24 hr tablet Take 1 tablet (50 mg total) by mouth daily. Take with or immediately following a meal. 30 tablet 0  . pantoprazole (PROTONIX) 40 MG tablet Take 1 tablet (40 mg total) by mouth daily. 30 tablet 0  . potassium chloride SA (K-DUR,KLOR-CON) 20 MEQ tablet Take 1 tablet (20 mEq total) by mouth daily. 6 tablet 0   No current facility-administered medications on file prior to visit.     Allergies:  No Known Allergies   Physical Exam  Vitals:   12/02/17 0936  BP: (!) 155/69  Pulse: 87  Height: 4\' 10"  (1.473 m)   Body mass index is 32.07 kg/m. No exam data present  General: well developed, Spanish-speaking elderly female, well nourished, seated, in no evident distress Head: head normocephalic and atraumatic.   Neck: supple with no carotid or supraclavicular bruits Cardiovascular: regular rate and rhythm, no murmurs Musculoskeletal: no deformity Skin:  no  rash/petichiae Vascular:  Normal pulses all extremities Respiratory: Slightly diminished breath sounds in bilateral lower bases, clear to auscultation in bilateral upper lobes  Neurologic Exam Mental Status: Awake and fully alert. Oriented to place and time her son. Recent and remote memory intact per son. Attention span, concentration and fund of knowledge appropriate. Mood and affect appropriate.  Cranial Nerves: Fundoscopic exam reveals sharp disc margins. Pupils equal, briskly reactive to light. Extraocular movements full without nystagmus. Visual fields full to confrontation. Hearing intact. Facial sensation intact.  Minor right facial paralysis. Motor: Left upper and lower extremity 5/5.  RUE: 3/5 tricept and shoulder, 4/5 bicep, weak hand grasp; RLE: 4/5, weak dorsiflexon Sensory.: intact to touch , pinprick , position and vibratory sensation.  Coordination: Decreased rapid alternating movements in right upper and lower extremities. Gait and Station: Unable to perform ambulation assessment is patient is sitting in wheelchair and has difficulty ambulating without assistance of walker  which is not present appointment. Reflexes: 1+ and symmetric. Toes downgoing.    NIHSS  4 Modified Rankin  4    Diagnostic Data (Labs, Imaging, Testing)  Ct Head Code Stroke Wo Contrast 09/02/2017   IMPRESSION: 1. Hyperdense 2.7 cm area of hemorrhage in the posterior left lentiform nuclei with surrounding edema. Mild regional mass effect with no midline shift. No intraventricular or extra-axial extension of hemorrhage. 2. Advanced bilateral cerebral white matter disease and small area of chronic cortical encephalomalacia in the posterior left MCA territory. 3. ASPECTS is not applicable, acute hemorrhage.  Echocardiogram 09/03/17 Study Conclusions - Left ventricle: The cavity size was normal. Wall thickness was increased in a pattern of moderate LVH. Systolic function was normal. The estimated  ejection fraction was in the range of 60% to 65%. Wall motion was normal; there were no regional wall motion abnormalities. Doppler parameters are consistent with abnormal left ventricular relaxation (grade 1 diastolic dysfunction). The E/e&' ratio is between 8-15, suggesting indeterminate LV filling pressure. - Aortic valve: Mildly calcified leaflets. Mild stenosis. There was no regurgitation. Mean gradient (S): 12 mm Hg. Peak gradient (S): 22 mm Hg. Valve area (VTI): 1.64 cm^2. Valve area (Vmax): 1.63 cm^2. Valve area (Vmean): 1.74 cm^2. - Mitral valve: Mildly thickened leaflets . There was trivial regurgitation. - Left atrium: The atrium was normal in size. - Tricuspid valve: There was mild regurgitation. - Pulmonary arteries: PA peak pressure: 49 mm Hg (S). - Inferior vena cava: The vessel was normal in size. The respirophasic diameter changes were in the normal range (>= 50%), consistent with normal central venous pressure. Impressions: - LVEF 60-65%, moderate LVH, normal wall motion, grade 1 DD, indeterminate LV filling pressure, mild aortic stenosis - mean gradient 12 mmHg, AVA of 1.7 cm2, normal biatrial size, mild TR, RVSP 49 mmHg, normal IVC.  CTA Head/Neck 09/03/17 No evidence of increased bleeding in the left basal ganglia. No evidence of increased mass effect.Aortic atherosclerosis. 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 Both carotid bifurcations are widely patent.No posterior circulation compromise  MRI Head 09/04/17                                                         1. Hemorrhagic left basal ganglia infarct. 2. Advanced chronic small vessel ischemic disease. 3. Chronic left parietal infarct.    ASSESSMENT: Cindy Thornton is a 67 y.o. year old female here with left basal ganglia hemorrhage on 09/02/17 secondary to hypertension. Vascular  risk factors are HTN, HLD, and DMII.    PLAN: -Repeat CT scan for resolution of bleed - if hemorrhage resolved, will restart ASA 81mg  -Continue lipitor 20mg  -Outpatient PT/OT/SLP referral placed -Follow up with health department regarding HLD, HTN, and DMII management -Maintain strict control of hypertension with blood pressure goal below 130/90, diabetes with hemoglobin A1c goal below 6.5% and cholesterol with LDL cholesterol (bad cholesterol) goal below 70 mg/dL. I also advised the patient to eat a healthy diet with plenty of whole grains, cereals, fruits and vegetables, exercise regularly and maintain ideal body weight. Greater than 50% time during this 25 minute consultation visit was spent on counseling and coordination of care about HTN, HLD, and DM, discussion about risk benefit of anticoagulation and answering questions.  -Follow up  6 months   Orders Placed This Encounter  Procedures  . CT HEAD WO CONTRAST  . Ambulatory referral to Physical Therapy  . Ambulatory referral to Occupational Therapy  . Ambulatory referral to Speech Therapy     George Hugh, [facility]  [facility] [address] [place], [place] [address]  Phone [phone] Fax [phone]

## 2017-12-02 NOTE — Telephone Encounter (Signed)
12/02/17 self pay order sent to [facility]

## 2017-12-02 NOTE — Progress Notes (Signed)
I agree with the above plan 

## 2017-12-02 NOTE — Patient Instructions (Addendum)
-   lipitor 20mg   for secondary stroke prevention  -CT scan to look for resolution of brain bleed - if your scan looks okay, we will call you and restart your aspirin 81mg   -referral placed for PT/OT/SLP - we will call you to set up an appointment  -monitor blood pressure at home and record. Bring this with you to during health department visits  -take tylenol as needed for pain  -follow up with health department regarding diarrhea from potentially metformin, cough, and blood pressure/cholesterol management   Maintain strict control of hypertension with blood pressure goal below 130/90, diabetes with hemoglobin A1c goal below 6.5% and cholesterol with LDL cholesterol (bad cholesterol) goal below 70 mg/dL. I also advised the patient to eat a healthy diet with plenty of whole grains, cereals, fruits and vegetables, exercise regularly and maintain ideal body weight.  Followup in the future with me in 6 months

## 2017-12-29 ENCOUNTER — Other Ambulatory Visit: Payer: Self-pay

## 2018-06-05 ENCOUNTER — Ambulatory Visit: Payer: Self-pay | Admitting: Adult Health

## 2019-07-12 IMAGING — CT CT HEAD CODE STROKE
4 series · 15 of 47 positions shown, 17 images · non-contrast
Comparison: None.

ADDENDUM:
Critical Value/emergent results were called by telephone at the time
of interpretation on 09/02/2017 at 1154 hours to Dr. PREISSLER BALEDI ,
who verbally acknowledged these results.
CLINICAL DATA: Code stroke. 58-year-old female, nonverbal,
Spanish-speaking.

EXAM:
CT HEAD WITHOUT CONTRAST
TECHNIQUE: Contiguous axial images were obtained from the base of the skull
through the vertex without intravenous contrast.

[Series 3: head wo · axial · 0.40mm/px · z∈[-115,-5]mm · 7 of 30 slices shown, 9 images]
[im 4/30  brain]
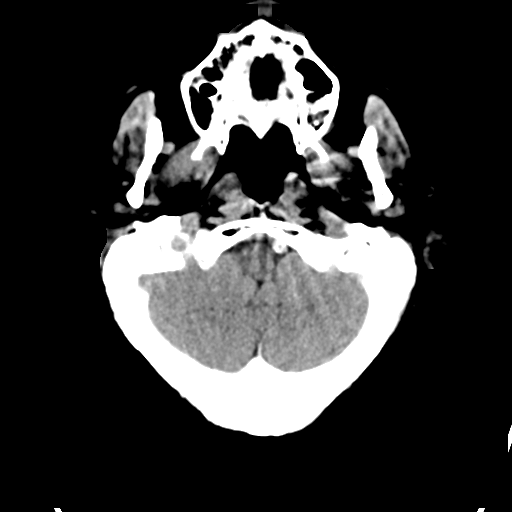
[im 4/30  bone]
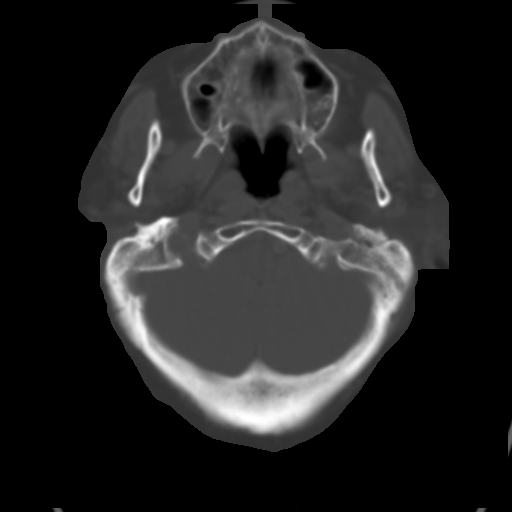
[im 8/30  brain]
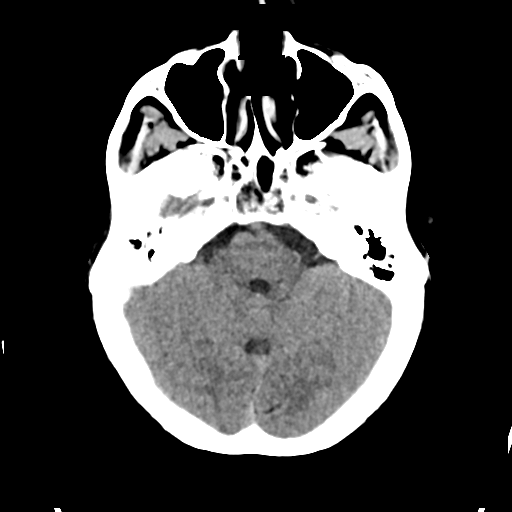
[im 11/30  brain]
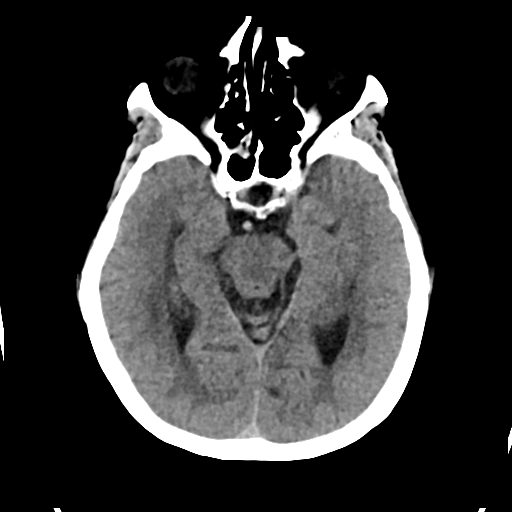
[im 15/30  brain]
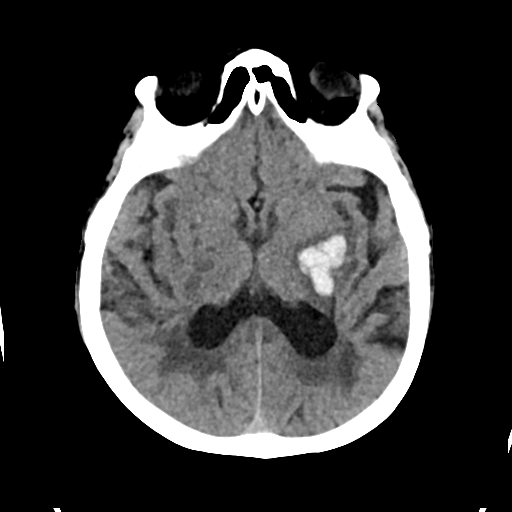
[im 19/30  brain]
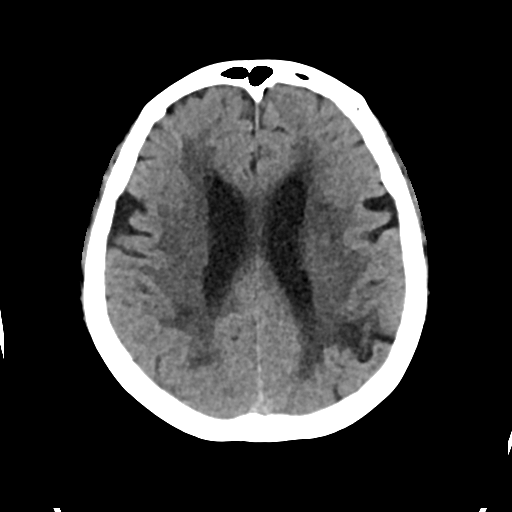
[im 19/30  bone]
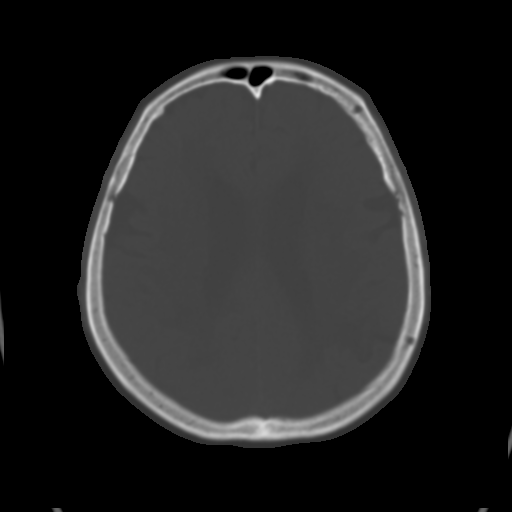
[im 22/30  brain]
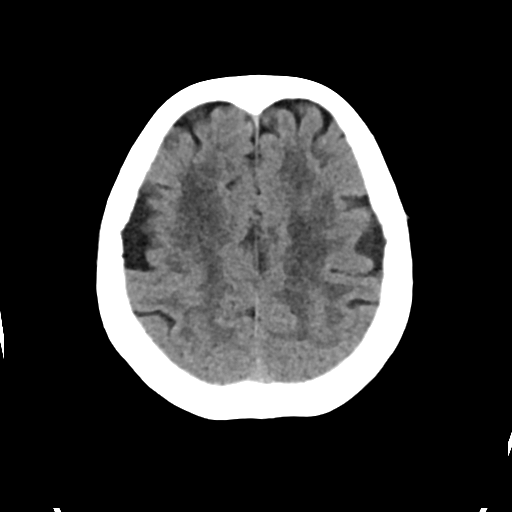
[im 26/30  brain]
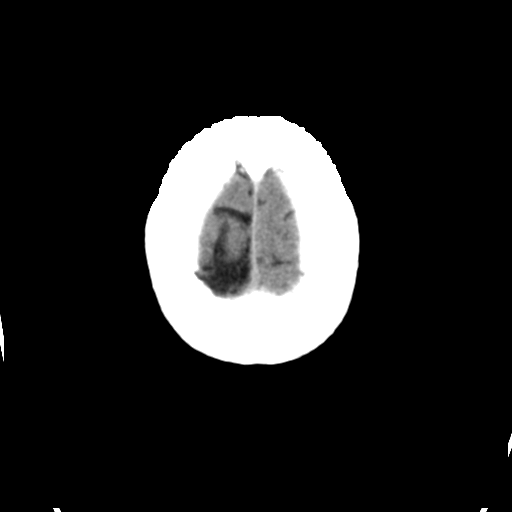

[Series 4: head bone · axial · 0.40mm/px · z∈[-116,-102]mm · 2 of 73 slices shown]
[im 8/73  bone]
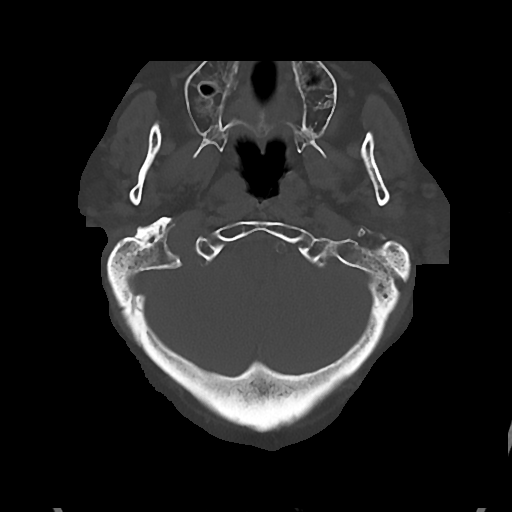
[im 15/73  bone]
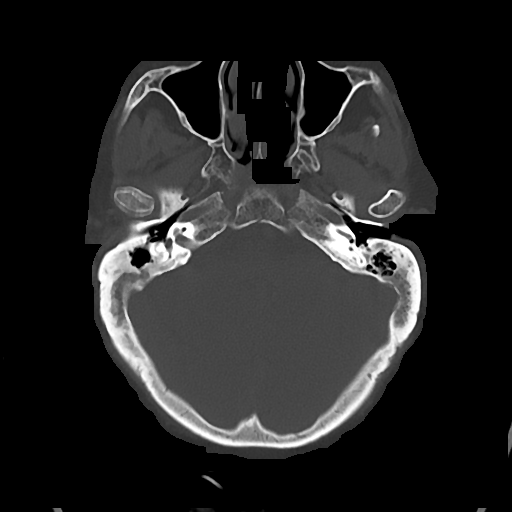

[Series 5: cor soft · coronal · 0.32mm/px · 3 of 67 slices shown]
[im 23/67  brain]
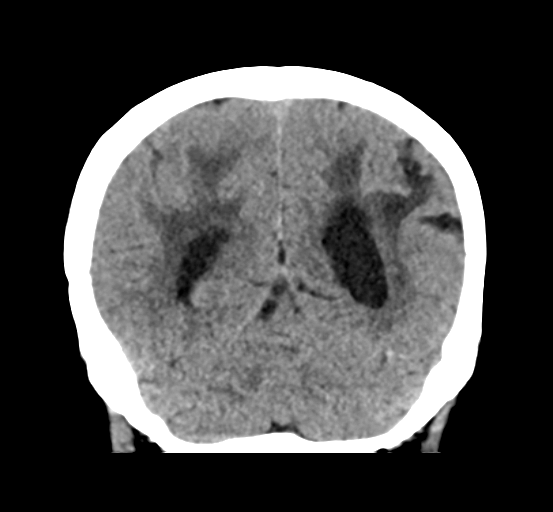
[im 30/67  brain]
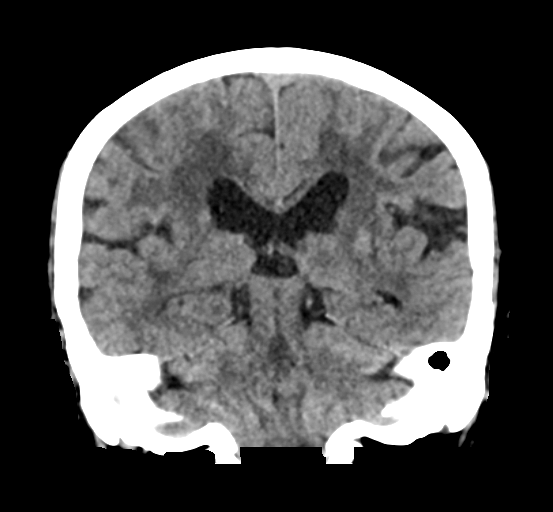
[im 37/67  brain]
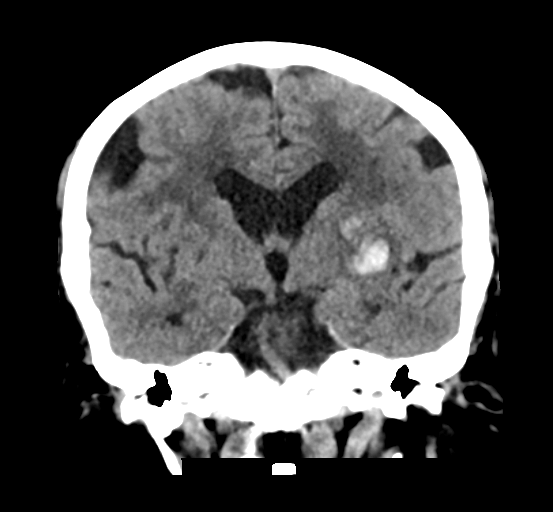

[Series 6: sag soft · sagittal · 0.30mm/px · 3 of 62 slices shown]
[im 21/62  brain]
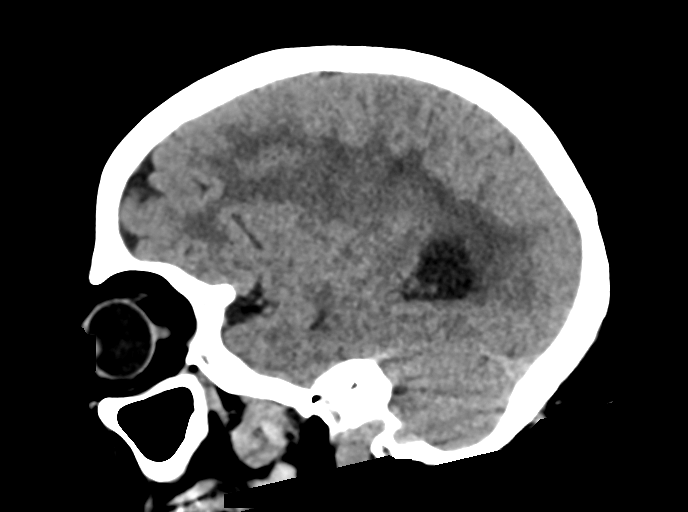
[im 31/62  brain]
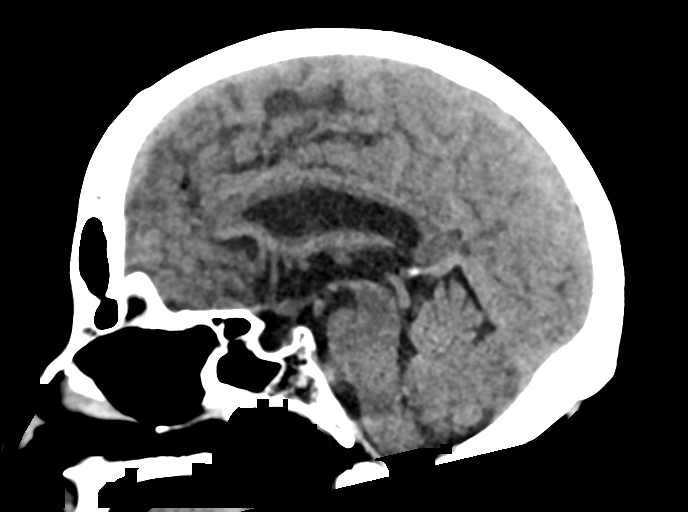
[im 41/62  brain]
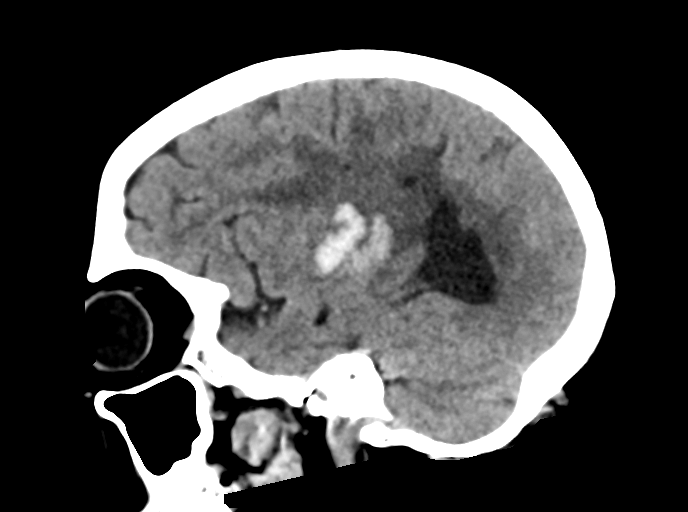

[15 of 47 positions shown; findings below may reference images not displayed]

FINDINGS: Brain: Somewhat Triangular-shaped hyperdense hemorrhage in the left
posterior lentiform encompassing 27 x 20 x 23 mm (estimated blood
volume 6 mL) with surrounding edema. Mild regional mass effect. No
midline shift. Patent basilar cisterns. No intraventricular or
extra-axial extension identified. No ventriculomegaly.

Superimposed confluent bilateral cerebral white matter hypodensity.
Superimposed small area of chronic cortical encephalomalacia in the
posterior left frontal or anterior parietal lobe (sagittal image
47). No other cortical encephalomalacia identified. No cortically
based acute infarct identified.

Vascular: Calcified atherosclerosis at the skull base. The distal
left vertebral artery appears dominant. No suspicious intracranial
vascular hyperdensity.

Skull: No acute osseous abnormality identified.

Sinuses/Orbits: Mild bilateral ethmoid sinus mucosal thickening.
Moderate right sphenoid mucosal thickening. No sinus fluid levels.
Chronic appearing sclerosis of the mastoids, with chronic
coalescence on the right.

Other: Leftward gaze deviation. Visualized scalp soft tissues are
within normal limits.

ASPECTS ([place] Stroke Program Early CT Score): Not applicable,
acute hemorrhage.
IMPRESSION: 1. Hyperdense 2.7 cm area of hemorrhage in the posterior left
lentiform nuclei with surrounding edema. Mild regional mass effect
with no midline shift. No intraventricular or extra-axial extension
of hemorrhage.
2. Advanced bilateral cerebral white matter disease and small area
of chronic cortical encephalomalacia in the posterior left MCA
territory.
3. ASPECTS is not applicable, acute hemorrhage.
